# Patient Record
Sex: Male | Born: 1945 | Race: White | Hispanic: No | Marital: Married | Smoking: Former smoker
Health system: Southern US, Community
[De-identification: ages and names within clinical notes are randomized; demographics above are authoritative.]

## PROBLEM LIST (undated history)

## (undated) DIAGNOSIS — I739 Peripheral vascular disease, unspecified: Secondary | ICD-10-CM

## (undated) DIAGNOSIS — I2510 Atherosclerotic heart disease of native coronary artery without angina pectoris: Secondary | ICD-10-CM

## (undated) DIAGNOSIS — E119 Type 2 diabetes mellitus without complications: Secondary | ICD-10-CM

## (undated) DIAGNOSIS — K219 Gastro-esophageal reflux disease without esophagitis: Secondary | ICD-10-CM

## (undated) DIAGNOSIS — I10 Essential (primary) hypertension: Secondary | ICD-10-CM

## (undated) DIAGNOSIS — I255 Ischemic cardiomyopathy: Secondary | ICD-10-CM

## (undated) DIAGNOSIS — M1990 Unspecified osteoarthritis, unspecified site: Secondary | ICD-10-CM

## (undated) DIAGNOSIS — E785 Hyperlipidemia, unspecified: Secondary | ICD-10-CM

## (undated) DIAGNOSIS — I5022 Chronic systolic (congestive) heart failure: Secondary | ICD-10-CM

## (undated) HISTORY — DX: Ischemic cardiomyopathy: I25.5

## (undated) HISTORY — DX: Hyperlipidemia, unspecified: E78.5

## (undated) HISTORY — DX: Essential (primary) hypertension: I10

## (undated) HISTORY — PX: TONSILLECTOMY: SUR1361

## (undated) HISTORY — PX: PERIPHERAL ATHRECTOMY: SHX6227

---

## 1972-09-17 HISTORY — PX: INGUINAL HERNIA REPAIR: SUR1180

## 2011-07-18 DIAGNOSIS — I739 Peripheral vascular disease, unspecified: Secondary | ICD-10-CM | POA: Insufficient documentation

## 2011-07-18 DIAGNOSIS — I10 Essential (primary) hypertension: Secondary | ICD-10-CM | POA: Insufficient documentation

## 2011-07-18 DIAGNOSIS — E782 Mixed hyperlipidemia: Secondary | ICD-10-CM | POA: Insufficient documentation

## 2012-12-09 DIAGNOSIS — E785 Hyperlipidemia, unspecified: Secondary | ICD-10-CM | POA: Insufficient documentation

## 2012-12-09 DIAGNOSIS — E119 Type 2 diabetes mellitus without complications: Secondary | ICD-10-CM | POA: Insufficient documentation

## 2012-12-09 DIAGNOSIS — K219 Gastro-esophageal reflux disease without esophagitis: Secondary | ICD-10-CM | POA: Insufficient documentation

## 2013-04-03 ENCOUNTER — Other Ambulatory Visit (HOSPITAL_COMMUNITY): Payer: Self-pay | Admitting: Podiatrist

## 2013-04-03 DIAGNOSIS — I739 Peripheral vascular disease, unspecified: Secondary | ICD-10-CM

## 2013-04-06 ENCOUNTER — Ambulatory Visit (HOSPITAL_COMMUNITY)
Admission: RE | Admit: 2013-04-06 | Discharge: 2013-04-06 | Disposition: A | Discharge status: Home / Self Care | Payer: Managed Care, Other (non HMO) | Source: Ambulatory Visit | Attending: Cardiology | Admitting: Cardiology

## 2013-04-06 DIAGNOSIS — I739 Peripheral vascular disease, unspecified: Secondary | ICD-10-CM

## 2013-04-06 DIAGNOSIS — I70219 Atherosclerosis of native arteries of extremities with intermittent claudication, unspecified extremity: Secondary | ICD-10-CM

## 2013-04-06 NOTE — Progress Notes (Signed)
Lower Ext. Arterial Duplex Completed. Marilynne Halsted, RDMS, RVT

## 2013-04-17 ENCOUNTER — Encounter: Payer: Self-pay | Admitting: Cardiovascular Disease

## 2013-04-17 ENCOUNTER — Ambulatory Visit (INDEPENDENT_AMBULATORY_CARE_PROVIDER_SITE_OTHER): Payer: Managed Care, Other (non HMO) | Admitting: Cardiovascular Disease

## 2013-04-17 VITALS — BP 144/70 | HR 72 | Ht 71.0 in | Wt 200.0 lb

## 2013-04-17 DIAGNOSIS — I739 Peripheral vascular disease, unspecified: Secondary | ICD-10-CM

## 2013-04-17 DIAGNOSIS — I10 Essential (primary) hypertension: Secondary | ICD-10-CM

## 2013-04-17 DIAGNOSIS — E785 Hyperlipidemia, unspecified: Secondary | ICD-10-CM

## 2013-04-17 DIAGNOSIS — Z720 Tobacco use: Secondary | ICD-10-CM

## 2013-04-17 DIAGNOSIS — F17200 Nicotine dependence, unspecified, uncomplicated: Secondary | ICD-10-CM

## 2013-04-17 DIAGNOSIS — E119 Type 2 diabetes mellitus without complications: Secondary | ICD-10-CM

## 2013-04-17 DIAGNOSIS — R5383 Other fatigue: Secondary | ICD-10-CM

## 2013-04-17 DIAGNOSIS — D689 Coagulation defect, unspecified: Secondary | ICD-10-CM

## 2013-04-17 DIAGNOSIS — Z79899 Other long term (current) drug therapy: Secondary | ICD-10-CM

## 2013-04-17 DIAGNOSIS — Z01818 Encounter for other preprocedural examination: Secondary | ICD-10-CM

## 2013-04-17 DIAGNOSIS — R5381 Other malaise: Secondary | ICD-10-CM

## 2013-04-17 DIAGNOSIS — E1159 Type 2 diabetes mellitus with other circulatory complications: Secondary | ICD-10-CM | POA: Insufficient documentation

## 2013-04-17 HISTORY — PX: CARDIAC CATHETERIZATION: SHX172

## 2013-04-17 MED ORDER — ASPIRIN EC 81 MG PO TBEC: 81.0000 mg | DELAYED_RELEASE_TABLET | Freq: Every day | ORAL | Status: DC

## 2013-04-17 NOTE — Assessment & Plan Note (Signed)
[name] has less than 11 claudication at less than 100 feet. His predominately involves his calves. He had Dopplers performed in our office on 04/07/13 revealing a right ABI of 0.63 and a left of 0.70. He had high-frequency signals in both mid SF age as well as tibial vessel disease typical of diabetics. He presents now for angiography and potential percutaneous intervention.

## 2013-04-17 NOTE — Assessment & Plan Note (Signed)
Controlled on current medications 

## 2013-04-17 NOTE — Progress Notes (Signed)
04/17/2013 Elly Modena   68/06/1946  [id number]  Primary Physician Pcp Not In System Primary Cardiologist: [name] MD Roseanne Reno   HPI:  [name] is a 67 year old married Caucasian male father of one, grandmother, grandfather is accompanied by his wife today. He was referred by [facility] for evaluation of claudication and arterial Doppler studies which were obtained in our office 04/07/13. His cardiovascular risk factors include type 2 diabetes, hypertension, and hyperlipidemia. He has a 50-100-pack-year history of tobacco abuse currently smoking one pack to 2 packs a day. There is no family history of heart disease. He's never had a heart attack or stroke. He does complain of dyspnea on exertion. He has had claudication for the last 2 years worse over the last 3 months which is now lifestyle limiting. Doppler studies in our office performed 04/07/13 revealed a right ABI of 0.63 and a left ABI of 0.70. He had high-grade SFA disease bilaterally as well as tibial disease.   Current Outpatient Prescriptions  Medication Sig Dispense Refill  . cilostazol (PLETAL) 100 MG tablet 100 mg 2 (two) times daily.       Marland Kitchen glipiZIDE (GLUCOTROL) 10 MG tablet Take 10 mg by mouth 2 (two) times daily before a meal.       . lansoprazole (PREVACID) 30 MG capsule 30 mg daily.       Marland Kitchen lisinopril (PRINIVIL,ZESTRIL) 40 MG tablet 40 mg daily.       . metoprolol (LOPRESSOR) 100 MG tablet Take 100 mg by mouth 2 (two) times daily.       Marland Kitchen NIFEDICAL XL 60 MG 24 hr tablet Take 60 mg by mouth daily.       . pioglitazone-metformin (ACTOPLUS MET) 15-850 MG per tablet 1 tablet 2 (two) times daily with a meal.       . pravastatin (PRAVACHOL) 40 MG tablet 40 mg daily.        No current facility-administered medications for this visit.    No Known Allergies  History   Social History  . Marital Status: Married    Spouse Name: N/A    Number of Children: N/A  . Years of Education: N/A    Occupational History  . Not on file.   Social History Main Topics  . Smoking status: Current Every Day Smoker -- 1.00 packs/day    Types: Cigarettes  . Smokeless tobacco: Not on file  . Alcohol Use: No  . Drug Use: Not on file  . Sexually Active: Not on file   Other Topics Concern  . Not on file   Social History Narrative  . No narrative on file     Review of Systems: General: negative for chills, fever, night sweats or weight changes.  Cardiovascular: negative for chest pain, dyspnea on exertion, edema, orthopnea, palpitations, paroxysmal nocturnal dyspnea or shortness of breath Dermatological: negative for rash Respiratory: negative for cough or wheezing Urologic: negative for hematuria Abdominal: negative for nausea, vomiting, diarrhea, bright red blood per rectum, melena, or hematemesis Neurologic: negative for visual changes, syncope, or dizziness All other systems reviewed and are otherwise negative except as noted above.    Blood pressure 144/70, pulse 72, height 5\' 11"  (1.803 m), weight 200 lb (90.719 kg).  General appearance: alert and no distress Neck: no adenopathy, no carotid bruit, no JVD, supple, symmetrical, trachea midline and thyroid not enlarged, symmetric, no tenderness/mass/nodules Lungs: clear to auscultation bilaterally Heart: regular rate and rhythm, S1, S2 normal, no murmur, click, rub  or gallop Abdomen: soft, non-tender; bowel sounds normal; no masses,  no organomegaly Extremities: extremities normal, atraumatic, no cyanosis or edema Pulses: 2+ and symmetric diminished pedal pulses bilaterally  EKG normal sinus rhythm at 90 without ST or T wave changes. There were insignificant Q waves in the inferior leads  ASSESSMENT AND PLAN:   Claudication [name] has less than 11 claudication at less than 100 feet. His predominately involves his calves. He had Dopplers performed in our office on 04/07/13 revealing a right ABI of 0.63 and a left of 0.70.  He had high-frequency signals in both mid SF age as well as tibial vessel disease typical of diabetics. He presents now for angiography and potential percutaneous intervention.  Essential hypertension Controlled on current medications  Dyspnea on exertion This may be an anginal equivalent versus related to long-term tobacco abuse. We'll obtain a left skin my view to risk stratify him prior to his upcoming surgical procedure      [name] MD [facility], [facility] 04/17/2013 8:58 AM

## 2013-04-17 NOTE — Patient Instructions (Signed)
Dr. [name] has ordered a peripheral angiogram to be done at [facility].  This procedure is going to look at the bloodflow in your lower extremities.  If Dr. [name] is able to open up the arteries, you will have to spend one night in the hospital.  If he is not able to open the arteries, you will be able to go home that same day.    After the procedure, you will not be allowed to drive for 3 days or push, pull, or lift anything greater than 10 lbs for one week.    You will be required to have bloodwork and a chest xray prior to your procedure.  Our scheduler will advise you on when these items need to be done.       Reps: Alexander Bergeron Left groin access  Dr [name] has ordered a lexiscan myoview to be done prior to the angiogram.  Start aspirin 81mg  daily.

## 2013-04-17 NOTE — Assessment & Plan Note (Signed)
This may be an anginal equivalent versus related to long-term tobacco abuse. We'll obtain a left skin my view to risk stratify him prior to his upcoming surgical procedure

## 2013-04-20 ENCOUNTER — Encounter (HOSPITAL_COMMUNITY): Payer: Self-pay | Admitting: Pharmacy Technician

## 2013-04-21 ENCOUNTER — Ambulatory Visit (HOSPITAL_COMMUNITY)
Admission: RE | Admit: 2013-04-21 | Discharge: 2013-04-21 | Disposition: A | Discharge status: Home / Self Care | Payer: Managed Care, Other (non HMO) | Source: Ambulatory Visit | Attending: Cardiovascular Disease | Admitting: Cardiovascular Disease

## 2013-04-21 DIAGNOSIS — R5383 Other fatigue: Secondary | ICD-10-CM | POA: Insufficient documentation

## 2013-04-21 DIAGNOSIS — R0989 Other specified symptoms and signs involving the circulatory and respiratory systems: Secondary | ICD-10-CM | POA: Insufficient documentation

## 2013-04-21 DIAGNOSIS — E663 Overweight: Secondary | ICD-10-CM | POA: Insufficient documentation

## 2013-04-21 DIAGNOSIS — R5381 Other malaise: Secondary | ICD-10-CM | POA: Insufficient documentation

## 2013-04-21 DIAGNOSIS — F17200 Nicotine dependence, unspecified, uncomplicated: Secondary | ICD-10-CM | POA: Insufficient documentation

## 2013-04-21 DIAGNOSIS — Z01810 Encounter for preprocedural cardiovascular examination: Secondary | ICD-10-CM

## 2013-04-21 DIAGNOSIS — E119 Type 2 diabetes mellitus without complications: Secondary | ICD-10-CM | POA: Insufficient documentation

## 2013-04-21 DIAGNOSIS — R42 Dizziness and giddiness: Secondary | ICD-10-CM | POA: Insufficient documentation

## 2013-04-21 DIAGNOSIS — R0609 Other forms of dyspnea: Secondary | ICD-10-CM | POA: Insufficient documentation

## 2013-04-21 DIAGNOSIS — I739 Peripheral vascular disease, unspecified: Secondary | ICD-10-CM | POA: Insufficient documentation

## 2013-04-21 DIAGNOSIS — E785 Hyperlipidemia, unspecified: Secondary | ICD-10-CM

## 2013-04-21 DIAGNOSIS — I10 Essential (primary) hypertension: Secondary | ICD-10-CM

## 2013-04-21 MED ORDER — REGADENOSON 0.4 MG/5ML IV SOLN
0.4000 mg | Freq: Once | INTRAVENOUS | Status: AC
  Administered 2013-04-21: 0.4 mg via INTRAVENOUS

## 2013-04-21 MED ORDER — TECHNETIUM TC 99M SESTAMIBI GENERIC - CARDIOLITE
30.8000 | Freq: Once | INTRAVENOUS | Status: AC | PRN
  Administered 2013-04-21: 30.8 via INTRAVENOUS

## 2013-04-21 MED ORDER — TECHNETIUM TC 99M SESTAMIBI GENERIC - CARDIOLITE
10.2000 | Freq: Once | INTRAVENOUS | Status: AC | PRN
  Administered 2013-04-21: 10 via INTRAVENOUS

## 2013-04-21 NOTE — Procedures (Addendum)
[name] [address] [place] [address] [place] [address] [phone]  Cardiology Nuclear Med Study  Dennis [name] is a 67 y.o. male     MRN : [id number]     DOB: 01-Nov-1945  Procedure Date: 04/21/2013  Nuclear Med Background Indication for Stress Test:  Surgical Clearance History:  NO PRIOR HISTORY REPORTED Cardiac Risk Factors: Hypertension, Lipids, NIDDM, Overweight, PVD and Smoker  Symptoms:  DOE, Fatigue and Light-Headedness   Nuclear Pre-Procedure Caffeine/Decaff Intake:  7:00pm NPO After: 5:00am   IV Site: R Hand  IV 0.9% NS with Angio Cath:  22g  Chest Size (in):  42"  IV Started by: Emmit Pomfret, RN  Height: 5\' 11"  (1.803 m)  Cup Size: n/a  BMI:  Body mass index is 27.91 kg/(m^2). Weight:  200 lb (90.719 kg)   Tech Comments:  N/A    Nuclear Med Study 1 or 2 day study: 1 day  Stress Test Type:  Lexiscan  Order Authorizing Provider:  Nanetta Batty, MD   Resting Radionuclide: Technetium 59m Sestamibi  Resting Radionuclide Dose: 10.2 mCi   Stress Radionuclide:  Technetium 68m Sestamibi  Stress Radionuclide Dose: 30.8 mCi           Stress Protocol Rest HR:83 Stress HR:97  Rest BP: 139/93 Stress BP:160/84  Exercise Time (min): n/a METS: n/a          Dose of Adenosine (mg):  n/a Dose of Lexiscan: 0.4 mg  Dose of Atropine (mg): n/a Dose of Dobutamine: n/a mcg/kg/min (at max HR)  Stress Test Technologist: Ernestene Mention, CCT Nuclear Technologist: Gonzella Lex, CNMT   Rest Procedure:  Myocardial perfusion imaging was performed at rest 45 minutes following the intravenous administration of Technetium 89m Sestamibi. Stress Procedure:  The patient received IV Lexiscan 0.4 mg over 15-seconds.  Technetium 82m Sestamibi injected at 30-seconds.  There were no significant changes with Lexiscan.  Quantitative spect images were obtained after a 45 minute delay.  Transient Ischemic Dilatation (Normal <1.22):  1.15 Lung/Heart Ratio  (Normal <0.45):  0.34 QGS EDV:  131 ml QGS ESV:  82 ml LV Ejection Fraction: 37%  Signed by  Gonzella Lex, CNMT  PHYSICIAN INTERPRETATION  Rest ECG: NSR with non-specific ST-T wave changes  Stress ECG: No significant change from baseline ECG and No significant ST segment change suggestive of ischemia.  QPS Raw Data Images:  There is significant trace uptake in the splanchnic viscera below the diaphragm that may interfere with the ability to interpret results. Stress Images:  There is decreased uptake in the inferior wall.  There is decreased uptake in the apex.  There is decreased uptake in the lateral wall.  There is partial reversibility in this area.  These findings are consistent with ischemia. Rest Images:  There is decreased uptake in the inferior wall.  There is decreased uptake in the apex.  There is decreased uptake in the lateral wall.  Comparison with the stress images reveals moderate change. Subtraction (SDS):  There is a large sized, medium intensity defect in the inferior, inferolateral and apical myocardium that is most consistent with a previous infarction.  There is ~moderate reversibility, that is consistent with peri-infarct ischemia.  Impression Exercise Capacity:  Lexiscan with no exercise. BP Response:  Normal blood pressure response. Clinical Symptoms:  There is dyspnea. ECG Impression:  No significant ECG changes with Lexiscan. Comparison with Prior Nuclear Study: No images to compare  Overall Impression:  High risk stress nuclear study With  a large area of moderately reversible perfusion defect in what appears to be the RCA (PDA) distribution..  LV Wall Motion:  Moderately reduced global EF with inferior-inferolateral & inferoapical hypokinesis and abnormal thickening consistent with prior infarction or severe resting inschemia.   Marykay Lex, MD  04/21/2013 12:57 PM

## 2013-04-24 ENCOUNTER — Ambulatory Visit (INDEPENDENT_AMBULATORY_CARE_PROVIDER_SITE_OTHER): Payer: Managed Care, Other (non HMO) | Admitting: Cardiology

## 2013-04-24 ENCOUNTER — Encounter: Payer: Self-pay | Admitting: Cardiology

## 2013-04-24 VITALS — BP 128/72 | HR 88 | Ht 71.0 in | Wt 200.5 lb

## 2013-04-24 DIAGNOSIS — E119 Type 2 diabetes mellitus without complications: Secondary | ICD-10-CM

## 2013-04-24 DIAGNOSIS — I739 Peripheral vascular disease, unspecified: Secondary | ICD-10-CM

## 2013-04-24 DIAGNOSIS — Z01818 Encounter for other preprocedural examination: Secondary | ICD-10-CM

## 2013-04-24 DIAGNOSIS — R9439 Abnormal result of other cardiovascular function study: Secondary | ICD-10-CM

## 2013-04-24 DIAGNOSIS — F17200 Nicotine dependence, unspecified, uncomplicated: Secondary | ICD-10-CM

## 2013-04-24 DIAGNOSIS — R931 Abnormal findings on diagnostic imaging of heart and coronary circulation: Secondary | ICD-10-CM

## 2013-04-24 DIAGNOSIS — Z720 Tobacco use: Secondary | ICD-10-CM

## 2013-04-24 DIAGNOSIS — E785 Hyperlipidemia, unspecified: Secondary | ICD-10-CM

## 2013-04-24 MED ORDER — NITROGLYCERIN 0.4 MG SL SUBL: 0.4000 mg | SUBLINGUAL_TABLET | SUBLINGUAL | Status: DC | PRN

## 2013-04-24 NOTE — Patient Instructions (Signed)
NTG needed for chest tightness or pressure. No stenuous activity or long trips till cleared by Dr [name].  Corine Shelter PA-C 04/24/2013 4:38 PM

## 2013-04-24 NOTE — Progress Notes (Signed)
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 Dopplers suggest bilat SFA disease. A Myoview was done 04/21/13 and was read as "high risk" with inferior ischemia. The pt will need to have a coronary angiogram and Dr [name] wanted him seen today to discuss his test results. The pt denies any chest pain.    Current Outpatient Prescriptions  Medication Sig Dispense Refill  . aspirin EC 81 MG tablet Take 1 tablet (81 mg total) by mouth daily.  90 tablet  3  . cilostazol (PLETAL) 100 MG tablet Take 100 mg by mouth 2 (two) times daily.       Marland Kitchen glipiZIDE (GLUCOTROL XL) 10 MG 24 hr tablet Take 10 mg by mouth 2 (two) times daily.      . lansoprazole (PREVACID) 30 MG capsule Take 30 mg by mouth daily.       Marland Kitchen lisinopril (PRINIVIL,ZESTRIL) 40 MG tablet Take 40 mg by mouth daily.       . metoprolol (LOPRESSOR) 100 MG tablet Take 100 mg by mouth 2 (two) times daily.       Marland Kitchen NIFEDICAL XL 60 MG 24 hr tablet Take 60 mg by mouth daily.       . pioglitazone-metformin (ACTOPLUS MET) 15-500 MG per tablet Take 1 tablet by mouth 2 (two) times daily with a meal.      . pravastatin (PRAVACHOL) 40 MG tablet Take 40 mg by mouth daily.       . nitroGLYCERIN (NITROSTAT) 0.4 MG SL tablet Place 1 tablet (0.4 mg total) under the tongue every 5 (five) minutes as needed for chest pain.  25 tablet  2   No current facility-administered medications for this visit.    No Known Allergies  History   Social History  . Marital Status: Married    Spouse Name: N/A    Number of Children: N/A  . Years of Education: N/A   Occupational History  . Not on file.   Social History Main Topics  . Smoking status: Current Every Day Smoker -- 1.00 packs/day    Types: Cigarettes  . Smokeless tobacco: Not on file  . Alcohol Use: No  . Drug Use: Not on file  . Sexually Active: Not on file   Other  Topics Concern  . Not on file   Social History Narrative  . No narrative on file     Review of Systems: General: negative for chills, fever, night sweats or weight changes.  Cardiovascular: negative for chest pain, dyspnea on exertion, edema, orthopnea, palpitations, paroxysmal nocturnal dyspnea or shortness of breath Dermatological: negative for rash Respiratory: negative for cough or wheezing Urologic: negative for hematuria Abdominal: negative for nausea, vomiting, diarrhea, bright red blood per rectum, melena, or hematemesis Neurologic: negative for visual changes, syncope, or dizziness All other systems reviewed and are otherwise negative except as noted above.    Blood pressure 128/72, pulse 88, height 5\' 11"  (1.803 m), weight 200 lb 8 oz (90.946 kg).  General appearance: alert, cooperative and no distress Lungs: clear to auscultation bilaterally Heart: regular rate and rhythm Abdomen: soft, non-tender; bowel sounds normal; no masses,  no organomegaly Extremities: good femoral pulses with no bruits Skin: Skin color, texture, turgor normal. No rashes or lesions Neurologic: Grossly normal   ASSESSMENT AND PLAN:   Abnormal nuclear cardiac imaging test This was read as high  risk with inferior ischemia. He denies any chest pain or unusual SOB.  Claudication He is for PV angiogram in 10 days.  Type 2 diabetes mellitus .  Tobacco abuse .  Hyperlipidemia .    PLAN  I explained the need for coronary angiogram. I explained the procedure and he is agreeable. His wife was present today as well.I added NTG SL prn to his current medications. I suggested he not take any long trip or do anything strenuous till we sort this out. I encouraged him to stop smoking.   [facility] KPA-C 04/24/2013 4:29 PM

## 2013-04-24 NOTE — Assessment & Plan Note (Signed)
This was read as high risk with inferior ischemia. He denies any chest pain or unusual SOB.

## 2013-04-24 NOTE — Assessment & Plan Note (Signed)
He is for PV angiogram in 10 days.

## 2013-04-28 ENCOUNTER — Ambulatory Visit
Admission: RE | Admit: 2013-04-28 | Discharge: 2013-04-28 | Disposition: A | Discharge status: Home / Self Care | Payer: Managed Care, Other (non HMO) | Source: Ambulatory Visit | Attending: Cardiovascular Disease | Admitting: Cardiovascular Disease

## 2013-04-28 ENCOUNTER — Encounter: Payer: Self-pay | Admitting: Cardiovascular Disease

## 2013-04-28 DIAGNOSIS — Z720 Tobacco use: Secondary | ICD-10-CM

## 2013-04-29 LAB — CBC
Platelets: 240 10*3/uL (ref 150–400)
RBC: 4.65 MIL/uL (ref 4.22–5.81)
RDW: 13.5 % (ref 11.5–15.5)
WBC: 7 10*3/uL (ref 4.0–10.5)

## 2013-04-29 LAB — BASIC METABOLIC PANEL
BUN: 25 mg/dL — ABNORMAL HIGH (ref 6–23)
Calcium: 9.3 mg/dL (ref 8.4–10.5)
Glucose, Bld: 301 mg/dL — ABNORMAL HIGH (ref 70–99)

## 2013-04-29 LAB — TSH: TSH: 3.299 u[IU]/mL (ref 0.350–4.500)

## 2013-04-29 LAB — PROTIME-INR
INR: 0.91 (ref <1.50)
Prothrombin Time: 12.3 seconds (ref 11.6–15.2)

## 2013-04-29 LAB — APTT: aPTT: 31 seconds (ref 24–37)

## 2013-04-30 ENCOUNTER — Ambulatory Visit: Payer: Managed Care, Other (non HMO) | Admitting: Cardiology

## 2013-04-30 ENCOUNTER — Telehealth: Payer: Self-pay | Admitting: Cardiovascular Disease

## 2013-04-30 NOTE — Telephone Encounter (Signed)
Pt wanted to know if his shingles shot on Monday will affect his procedure on Tuesday

## 2013-05-01 NOTE — Telephone Encounter (Signed)
Returned call.  Pt informed message received.  Also informed Samara Deist, RN responded to his e-mail and asked if he received it.  Pt stated he did receive it late last night.  Verbalized understanding that he should wait until after his procedure before getting shingles vaccine.

## 2013-05-01 NOTE — Telephone Encounter (Signed)
Returned call.  Left message to call back before 4pm.  Pt sent Advice Request on 8.12.14 and was advised by K. Petra Kuba, RN to wait until after the procedure to get the vaccine.  Will inform pt when he calls back.

## 2013-05-05 ENCOUNTER — Encounter (HOSPITAL_COMMUNITY): Payer: Self-pay | Admitting: Pharmacist

## 2013-05-05 ENCOUNTER — Other Ambulatory Visit: Payer: Self-pay | Admitting: *Deleted

## 2013-05-05 ENCOUNTER — Encounter (HOSPITAL_COMMUNITY)
Admission: RE | Disposition: A | Discharge status: Home / Self Care | Payer: Self-pay | Source: Ambulatory Visit | Attending: Cardiovascular Disease

## 2013-05-05 ENCOUNTER — Ambulatory Visit (HOSPITAL_COMMUNITY)
Admission: RE | Admit: 2013-05-05 | Discharge: 2013-05-05 | Disposition: A | Discharge status: Home / Self Care | Payer: Managed Care, Other (non HMO) | Source: Ambulatory Visit | Attending: Cardiovascular Disease | Admitting: Cardiovascular Disease

## 2013-05-05 DIAGNOSIS — F17200 Nicotine dependence, unspecified, uncomplicated: Secondary | ICD-10-CM | POA: Insufficient documentation

## 2013-05-05 DIAGNOSIS — I2510 Atherosclerotic heart disease of native coronary artery without angina pectoris: Secondary | ICD-10-CM

## 2013-05-05 DIAGNOSIS — I10 Essential (primary) hypertension: Secondary | ICD-10-CM | POA: Insufficient documentation

## 2013-05-05 DIAGNOSIS — E663 Overweight: Secondary | ICD-10-CM | POA: Insufficient documentation

## 2013-05-05 DIAGNOSIS — Z7982 Long term (current) use of aspirin: Secondary | ICD-10-CM | POA: Insufficient documentation

## 2013-05-05 DIAGNOSIS — I739 Peripheral vascular disease, unspecified: Secondary | ICD-10-CM

## 2013-05-05 DIAGNOSIS — Z79899 Other long term (current) drug therapy: Secondary | ICD-10-CM | POA: Insufficient documentation

## 2013-05-05 DIAGNOSIS — Z01818 Encounter for other preprocedural examination: Secondary | ICD-10-CM

## 2013-05-05 DIAGNOSIS — E119 Type 2 diabetes mellitus without complications: Secondary | ICD-10-CM | POA: Insufficient documentation

## 2013-05-05 DIAGNOSIS — E785 Hyperlipidemia, unspecified: Secondary | ICD-10-CM | POA: Insufficient documentation

## 2013-05-05 DIAGNOSIS — I70219 Atherosclerosis of native arteries of extremities with intermittent claudication, unspecified extremity: Secondary | ICD-10-CM | POA: Insufficient documentation

## 2013-05-05 HISTORY — PX: LEFT HEART CATHETERIZATION WITH CORONARY ANGIOGRAM: SHX5451

## 2013-05-05 HISTORY — PX: LOWER EXTREMITY ANGIOGRAM: SHX5508

## 2013-05-05 LAB — GLUCOSE, CAPILLARY
Glucose-Capillary: 331 mg/dL — ABNORMAL HIGH (ref 70–99)
Glucose-Capillary: 448 mg/dL — ABNORMAL HIGH (ref 70–99)

## 2013-05-05 SURGERY — ANGIOGRAM, LOWER EXTREMITY
Anesthesia: LOCAL

## 2013-05-05 MED ORDER — ONDANSETRON HCL 4 MG/2ML IJ SOLN: 4.0000 mg | Freq: Four times a day (QID) | INTRAMUSCULAR | Status: DC | PRN

## 2013-05-05 MED ORDER — MORPHINE SULFATE 2 MG/ML IJ SOLN: 1.0000 mg | INTRAMUSCULAR | Status: DC | PRN

## 2013-05-05 MED ORDER — ASPIRIN 81 MG PO CHEW
324.0000 mg | CHEWABLE_TABLET | ORAL | Status: AC
  Administered 2013-05-05: 324 mg via ORAL

## 2013-05-05 MED ORDER — NITROGLYCERIN 0.2 MG/ML ON CALL CATH LAB
INTRAVENOUS | Status: AC
  Filled 2013-05-05: qty 1

## 2013-05-05 MED ORDER — ACETAMINOPHEN 325 MG PO TABS: 650.0000 mg | ORAL_TABLET | ORAL | Status: DC | PRN

## 2013-05-05 MED ORDER — SODIUM CHLORIDE 0.9 % IJ SOLN: 3.0000 mL | INTRAMUSCULAR | Status: DC | PRN

## 2013-05-05 MED ORDER — HEPARIN (PORCINE) IN NACL 2-0.9 UNIT/ML-% IJ SOLN
INTRAMUSCULAR | Status: AC
  Filled 2013-05-05: qty 1000

## 2013-05-05 MED ORDER — MIDAZOLAM HCL 2 MG/2ML IJ SOLN
INTRAMUSCULAR | Status: AC
  Filled 2013-05-05: qty 2

## 2013-05-05 MED ORDER — FENTANYL CITRATE 0.05 MG/ML IJ SOLN
INTRAMUSCULAR | Status: AC
  Filled 2013-05-05: qty 2

## 2013-05-05 MED ORDER — SODIUM CHLORIDE 0.9 % IV SOLN
INTRAVENOUS | Status: DC
  Administered 2013-05-05: 07:00:00 via INTRAVENOUS

## 2013-05-05 MED ORDER — ASPIRIN 81 MG PO CHEW
CHEWABLE_TABLET | ORAL | Status: AC
  Administered 2013-05-05: 81 mg
  Filled 2013-05-05: qty 1

## 2013-05-05 MED ORDER — LIDOCAINE HCL (PF) 1 % IJ SOLN
INTRAMUSCULAR | Status: AC
  Filled 2013-05-05: qty 30

## 2013-05-05 MED ORDER — ASPIRIN 81 MG PO CHEW: 81.0000 mg | CHEWABLE_TABLET | Freq: Every day | ORAL | Status: DC

## 2013-05-05 MED ORDER — INSULIN ASPART 100 UNIT/ML ~~LOC~~ SOLN
15.0000 [IU] | Freq: Once | SUBCUTANEOUS | Status: AC
  Administered 2013-05-05: 15 [IU] via SUBCUTANEOUS

## 2013-05-05 MED ORDER — DIAZEPAM 5 MG PO TABS
ORAL_TABLET | ORAL | Status: AC
  Filled 2013-05-05: qty 1

## 2013-05-05 MED ORDER — SODIUM CHLORIDE 0.9 % IV SOLN: INTRAVENOUS | Status: AC

## 2013-05-05 MED ORDER — DIAZEPAM 5 MG PO TABS
5.0000 mg | ORAL_TABLET | ORAL | Status: AC
  Administered 2013-05-05: 5 mg via ORAL

## 2013-05-05 MED ORDER — ASPIRIN 81 MG PO CHEW
CHEWABLE_TABLET | ORAL | Status: AC
  Filled 2013-05-05: qty 4

## 2013-05-05 NOTE — H&P (Signed)
    Pt was reexamined and existing H & P reviewed. No changes found.  [name], MD [facility] 05/05/2013 7:40 AM

## 2013-05-05 NOTE — CV Procedure (Signed)
Zakari Bathe is a 67 y.o. male    [id number] LOCATION:  FACILITY: [facility]  PHYSICIAN: Nanetta Batty, M.D. Oct 09, 1945   DATE OF PROCEDURE:  05/05/2013  DATE OF DISCHARGE:   CARDIAC CATHETERIZATION     History obtained from chart review.[name] is a 67 year old married Caucasian male father of one, grandmother, grandfather is accompanied by his wife today. He was referred by [facility] for evaluation of claudication and arterial Doppler studies which were obtained in our office 04/07/13. His cardiovascular risk factors include type 2 diabetes, hypertension, and hyperlipidemia. He has a 50-100-pack-year history of tobacco abuse currently smoking one pack to 2 packs a day. There is no family history of heart disease. He's never had a heart attack or stroke. He does complain of dyspnea on exertion. He has had claudication for the last 2 years worse over the last 3 months which is now lifestyle limiting. Doppler studies in our office performed 04/07/13 revealed a right ABI of 0.63 and a left ABI of 0.70. He had high-grade SFA disease bilaterally as well as tibial disease.because of a positive Myoview stress test the patient first had a diagnostic coronary arteriogram. He now presents for abdominal aortography with femoral runoff to define his anatomy potential provide a minimally invasive percutaneous option for lifestyle limiting claudication    PROCEDURE DESCRIPTION:    The patient was brought to the second floor  [facility] cath lab in the postabsorptive state. He was  premedicated with Valium 5 mg by mouth, IV Versed and fentanyl. His left groin was prepped and shaved in usual sterile fashion. Xylocaine 1% was used  for local anesthesia. A 5 French sheath was inserted into the left common femoral  artery using standard Seldinger technique. A 5 French pigtail catheter was used for midstream abdominal aortography. It was then withdrawn to the iliac bifurcation and bilateral lateral  iliac angiography was performed as well as bifemoral runoff using bolus chase digital subtraction step table technique. Visipaque dye was used for the entirety of the case (164 cc administered to the patient for both cath and repeat angiography). Retrograde aortic, left ventricular end pullback pressures were recorded.   HEMODYNAMICS:    AO SYSTOLIC/AO DIASTOLIC: 152/72    ANGIOGRAPHIC RESULTS:   1: Abdominal aortogram-renal arteries are widely patent. The infrarenal abdominal aorta and iliac bifurcation were free of significant atherosclerotic changes.  2: Left lower extremity-the iliac and common femoral arteries were widely patent. There was a 95-90% focal mid left SFA stenosis with moderate disease on either side and one vessel runoff via the peroneal artery  3: Right lower extremity-there was a 99% mid right SFA stenosis straddled by moderate disease on each side with one vessel runoff via the peroneal artery     IMPRESSION:Mr. Bell has severe SFA the knees bilaterally as well as infrapopliteal disease with one vessel runoff. 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 The sheath was removed and pressure was held on the groin to achieve hemostasis. The patient left the Cath Lab in stable condition. He'll be hydrated for 4 hours and discharged home. He will return for a coronary artery bypass grafting. After recovery from this we will then focus attention on percutaneous revascularizing his lower extremities.  [name] MD, [facility] 05/05/2013 8:45 AM

## 2013-05-05 NOTE — CV Procedure (Signed)
Dennis [name] is a 67 y.o. male    [id number] LOCATION:  FACILITY: [facility]  PHYSICIAN: Nanetta Batty, M.D. 07/20/1946   DATE OF PROCEDURE:  05/05/2013  DATE OF DISCHARGE:   CARDIAC CATHETERIZATION     History obtained from chart review.Dennis [name] is a 67 year old married Caucasian male father of one, grandmother, grandfather is accompanied by his wife today. He was referred by [facility] for evaluation of claudication and arterial Doppler studies which were obtained in our office 04/07/13. His cardiovascular risk factors include type 2 diabetes, hypertension, and hyperlipidemia. He has a 50-100-pack-year history of tobacco abuse currently smoking one pack to 2 packs a day. There is no family history of heart disease. He's never had a heart attack or stroke. He does complain of dyspnea on exertion. He has had claudication for the last 2 years worse over the last 3 months which is now lifestyle limiting. Doppler studies in our office performed 04/07/13 revealed a right ABI of 0.63 and a left ABI of 0.70. He had high-grade SFA disease bilaterally as well as tibial disease. He had a Myoview stress test that showed an ejection fraction of 37% with a scar in the RCA territory and peri-infarct ischemia. Because of this he presents now for outpatient diagnostic coronary arteriography to define his coronary anatomy prior to imaging his peripheral vessels    PROCEDURE DESCRIPTION:    The patient was brought to the second floor [facility] cath lab in the postabsorptive state. He was premedicated with Valium 5 mg by mouth, IV Versed and fentanyl. His left groinwas prepped and shaved in usual sterile fashion. Xylocaine 1% was used for local anesthesia. A 5 French sheath was inserted into the left common femoral  artery using standard Seldinger technique. 5 French right and left Judkins diagnostic catheters as well as a 5 French pigtail catheter were used for selective coronary angiography, left  ventriculography, and subselective left internal mammary artery angiography. Visipaque dye which is for the entirety of the case. Retrograde aorta, left ventricular and pullback pressures were recorded.  HEMODYNAMICS:    AO SYSTOLIC/AO DIASTOLIC: 152/72   LV SYSTOLIC/LV DIASTOLIC: 151/14  ANGIOGRAPHIC RESULTS:   1. Left main; normal  2. LAD; 95% proximally after the takeoff of the first large diagonal branch. The diagonal branch had a 99% proximal stenosis 3. Left circumflex; 80% segmental proximal first obtuse marginal branch stenosis. This is a moderate-sized vessel.  4. Right coronary artery; dominant with total occlusion in the midportion bidirectional collaterals 5.LIMA was subselectively visualized and widely patent. It was suitable for use during regard bypass grafting if necessary 6. Left ventriculography; RAO left ventriculogram was performed using  25 mL of Visipaque dye at 12 mL/second. The overall LVEF estimated  35-40 %  With wall motion abnormalities notable for moderate inferobasal hypokinesia  IMPRESSION:Dennis [name] has three-vessel disease with moderate LV dysfunction and a moderate to high risk Myoview stress test. He is asymptomatic and is diabetic. He would benefit from complete revascularization using coronary artery bypass grafting. I have discussed this with Dr. Evelene Croon from [facility]  was agreed to see him in consultation prior to discharge home. He'll return for his surgical revascularization procedure  Dennis [name]. MD, [facility] 05/05/2013 8:40 AM
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
## 2013-05-08 ENCOUNTER — Ambulatory Visit (HOSPITAL_COMMUNITY)
Admit: 2013-05-08 | Discharge: 2013-05-08 | Disposition: A | Discharge status: Home / Self Care | Payer: Managed Care, Other (non HMO) | Attending: Surgery | Admitting: Surgery

## 2013-05-08 ENCOUNTER — Encounter (HOSPITAL_COMMUNITY)
Admit: 2013-05-08 | Discharge: 2013-05-08 | Disposition: A | Discharge status: Home / Self Care | Payer: Managed Care, Other (non HMO) | Attending: Surgery | Admitting: Surgery

## 2013-05-08 ENCOUNTER — Encounter (HOSPITAL_COMMUNITY): Payer: Self-pay

## 2013-05-08 ENCOUNTER — Ambulatory Visit (HOSPITAL_COMMUNITY)
Admission: RE | Admit: 2013-05-08 | Discharge: 2013-05-08 | Disposition: A | Discharge status: Home / Self Care | Payer: Managed Care, Other (non HMO) | Source: Ambulatory Visit | Attending: Surgery | Admitting: Surgery

## 2013-05-08 VITALS — BP 112/66 | HR 55 | Temp 98.2°F | Resp 18 | Ht 69.0 in | Wt 214.0 lb

## 2013-05-08 DIAGNOSIS — I10 Essential (primary) hypertension: Secondary | ICD-10-CM | POA: Insufficient documentation

## 2013-05-08 DIAGNOSIS — E119 Type 2 diabetes mellitus without complications: Secondary | ICD-10-CM | POA: Insufficient documentation

## 2013-05-08 DIAGNOSIS — Z01818 Encounter for other preprocedural examination: Secondary | ICD-10-CM | POA: Insufficient documentation

## 2013-05-08 DIAGNOSIS — I2510 Atherosclerotic heart disease of native coronary artery without angina pectoris: Secondary | ICD-10-CM | POA: Insufficient documentation

## 2013-05-08 DIAGNOSIS — E785 Hyperlipidemia, unspecified: Secondary | ICD-10-CM | POA: Insufficient documentation

## 2013-05-08 DIAGNOSIS — F17200 Nicotine dependence, unspecified, uncomplicated: Secondary | ICD-10-CM | POA: Insufficient documentation

## 2013-05-08 DIAGNOSIS — Z01812 Encounter for preprocedural laboratory examination: Secondary | ICD-10-CM | POA: Insufficient documentation

## 2013-05-08 DIAGNOSIS — Z01810 Encounter for preprocedural cardiovascular examination: Secondary | ICD-10-CM

## 2013-05-08 DIAGNOSIS — I739 Peripheral vascular disease, unspecified: Secondary | ICD-10-CM | POA: Insufficient documentation

## 2013-05-08 HISTORY — DX: Gastro-esophageal reflux disease without esophagitis: K21.9

## 2013-05-08 HISTORY — DX: Atherosclerotic heart disease of native coronary artery without angina pectoris: I25.10

## 2013-05-08 HISTORY — DX: Type 2 diabetes mellitus without complications: E11.9

## 2013-05-08 HISTORY — DX: Unspecified osteoarthritis, unspecified site: M19.90

## 2013-05-08 LAB — COMPREHENSIVE METABOLIC PANEL
Albumin: 3.8 g/dL (ref 3.5–5.2)
Alkaline Phosphatase: 66 U/L (ref 39–117)
BUN: 22 mg/dL (ref 6–23)
Calcium: 10.2 mg/dL (ref 8.4–10.5)
Creatinine, Ser: 1.21 mg/dL (ref 0.50–1.35)
GFR calc Af Amer: 69 mL/min — ABNORMAL LOW (ref >90)
Potassium: 5.1 mEq/L (ref 3.5–5.1)
Total Protein: 7.1 g/dL (ref 6.0–8.3)

## 2013-05-08 LAB — CBC
HCT: 41.8 % (ref 39.0–52.0)
MCH: 29.4 pg (ref 26.0–34.0)
MCHC: 34.2 g/dL (ref 30.0–36.0)
RDW: 13 % (ref 11.5–15.5)

## 2013-05-08 LAB — ABO/RH: ABO/RH(D): O POS

## 2013-05-08 LAB — BLOOD GAS, ARTERIAL
Acid-base deficit: 3.3 mmol/L — ABNORMAL HIGH (ref 0.0–2.0)
Bicarbonate: 20.7 mEq/L (ref 20.0–24.0)
O2 Saturation: 97 %
TCO2: 21.8 mmol/L (ref 0–100)
pCO2 arterial: 34.2 mmHg — ABNORMAL LOW (ref 35.0–45.0)
pO2, Arterial: 83.7 mmHg (ref 80.0–100.0)

## 2013-05-08 LAB — URINALYSIS, ROUTINE W REFLEX MICROSCOPIC
Bilirubin Urine: NEGATIVE
Glucose, UA: >1000 mg/dL — AB
Hgb urine dipstick: NEGATIVE
Ketones, ur: NEGATIVE mg/dL
Protein, ur: NEGATIVE mg/dL
pH: 5 (ref 5.0–8.0)

## 2013-05-08 LAB — URINE MICROSCOPIC-ADD ON

## 2013-05-08 LAB — PROTIME-INR
INR: 0.97 (ref 0.00–1.49)
Prothrombin Time: 12.7 seconds (ref 11.6–15.2)

## 2013-05-08 LAB — HEMOGLOBIN A1C
Hgb A1c MFr Bld: 12.4 % — ABNORMAL HIGH (ref <5.7)
Mean Plasma Glucose: 309 mg/dL — ABNORMAL HIGH (ref <117)

## 2013-05-08 LAB — PULMONARY FUNCTION TEST

## 2013-05-08 LAB — APTT: aPTT: 31 seconds (ref 24–37)

## 2013-05-08 LAB — TYPE AND SCREEN: ABO/RH(D): O POS

## 2013-05-08 MED ORDER — ALBUTEROL SULFATE (5 MG/ML) 0.5% IN NEBU
2.5000 mg | INHALATION_SOLUTION | Freq: Once | RESPIRATORY_TRACT | Status: AC
  Administered 2013-05-08: 2.5 mg via RESPIRATORY_TRACT

## 2013-05-08 NOTE — Progress Notes (Signed)
Per Dr. Laneta Simmers instruct patient to stop pletal patient informed

## 2013-05-08 NOTE — Progress Notes (Signed)
Pre-op Cardiac Surgery  Carotid Findings:   Findings suggest 1-39% internal carotid artery stenosis. There are elevated external carotid artery velocities bilaterally, suggestive of stenosis. Unable to visualize the right vertebral artery, the left vertebral artery is patent with antegrade flow.   Upper Extremity Right Left  Brachial Pressures 116-Triphasic 119-Triphasic  Radial Waveforms Triphasic Triphasic  Ulnar Waveforms Triphasic Triphasic  Palmar Arch (Allen's Test) Within normal limits. Signal obliterates with radial compression, is unaffected with ulnar compression.    Lower  Extremity Right Left  Dorsalis Pedis    Anterior Tibial    Posterior Tibial    Ankle/Brachial Indices      Findings:   The patient had an ABI and lower extremity arterial duplex evaluation completed on 04/07/13. Results can be found in [facility].  05/08/2013 12:44 PM Gertie Fey, RVT, RDCS, RDMS

## 2013-05-08 NOTE — Pre-Procedure Instructions (Signed)
Dennis [name]  05/08/2013   Your procedure is scheduled on:  August 26  Report to [facility] at 05:30 AM.  Call this number if you have problems the morning of surgery: [phone]   Remember:   Do not eat food or drink liquids after midnight.   Take these medicines the morning of surgery with A SIP OF WATER: Prevacid, Metoprolol, Nifedical   Do not take Aspirin, Aleve, Naproxen, Advil, Ibuprofen, Vitamin, Herbs, or Supplements starting today  Do not wear jewelry, make-up or nail polish.  Do not wear lotions, powders, or perfumes. You may wear deodorant.  Do not shave 48 hours prior to surgery. Men may shave face and neck.  Do not bring valuables to the hospital.  [facility] is not responsible                   for any belongings or valuables.  Contacts, dentures or bridgework may not be worn into surgery.  Leave suitcase in the car. After surgery it may be brought to your room.  For patients admitted to the hospital, checkout time is 11:00 AM the day of discharge.   Special Instructions: Shower using CHG 2 nights before surgery and the night before surgery.  If you shower the day of surgery use CHG.  Use special wash - you have one bottle of CHG for all showers.  You should use approximately 1/3 of the bottle for each shower.   Please read over the following fact sheets that you were given: Pain Booklet, Coughing and Deep Breathing, Blood Transfusion Information, Open Heart Packet, MRSA Information and Surgical Site Infection Prevention

## 2013-05-08 NOTE — Progress Notes (Signed)
Pt chart left for [place], [place] (anesthesia ) to review EKG and labs (glucose 396).

## 2013-05-08 NOTE — Progress Notes (Signed)
05/08/13 1438  OBSTRUCTIVE SLEEP APNEA  Have you ever been diagnosed with sleep apnea through a sleep study? No  Do you snore loudly (loud enough to be heard through closed doors)?  0  Do you often feel tired, fatigued, or sleepy during the daytime? 1  Has anyone observed you stop breathing during your sleep? 0  Do you have, or are you being treated for high blood pressure? 1  BMI more than 35 kg/m2? 0  Age over 68 years old? 1  Neck circumference greater than 40 cm/18 inches? 0  Gender: 1  Obstructive Sleep Apnea Score 4  Score 4 or greater  Results sent to PCP

## 2013-05-11 ENCOUNTER — Other Ambulatory Visit: Payer: Self-pay | Admitting: *Deleted

## 2013-05-11 DIAGNOSIS — B958 Unspecified staphylococcus as the cause of diseases classified elsewhere: Secondary | ICD-10-CM

## 2013-05-11 MED ORDER — VANCOMYCIN HCL 10 G IV SOLR
1500.0000 mg | INTRAVENOUS | Status: AC
  Administered 2013-05-12: 1500 mg via INTRAVENOUS
  Filled 2013-05-11 (×2): qty 1500

## 2013-05-11 MED ORDER — NITROGLYCERIN IN D5W 200-5 MCG/ML-% IV SOLN
2.0000 ug/min | INTRAVENOUS | Status: AC
  Administered 2013-05-12: 5 ug/min via INTRAVENOUS
  Filled 2013-05-11 (×2): qty 250

## 2013-05-11 MED ORDER — SODIUM CHLORIDE 0.9 % IV SOLN
INTRAVENOUS | Status: AC
  Administered 2013-05-12: 1 [IU]/h via INTRAVENOUS
  Filled 2013-05-11 (×2): qty 1

## 2013-05-11 MED ORDER — DEXTROSE 5 % IV SOLN
1.5000 g | INTRAVENOUS | Status: AC
  Administered 2013-05-12: 1.5 g via INTRAVENOUS
  Administered 2013-05-12: .75 g via INTRAVENOUS
  Filled 2013-05-11 (×2): qty 1.5

## 2013-05-11 MED ORDER — POTASSIUM CHLORIDE 2 MEQ/ML IV SOLN
80.0000 meq | INTRAVENOUS | Status: DC
  Filled 2013-05-11 (×2): qty 40

## 2013-05-11 MED ORDER — PLASMA-LYTE 148 IV SOLN
INTRAVENOUS | Status: AC
  Administered 2013-05-12: 08:00:00
  Filled 2013-05-11: qty 2.5

## 2013-05-11 MED ORDER — MAGNESIUM SULFATE 50 % IJ SOLN
40.0000 meq | INTRAMUSCULAR | Status: DC
  Filled 2013-05-11 (×2): qty 10

## 2013-05-11 MED ORDER — DEXTROSE 5 % IV SOLN
750.0000 mg | INTRAVENOUS | Status: DC
  Filled 2013-05-11: qty 750

## 2013-05-11 MED ORDER — MUPIROCIN 2 % EX OINT: TOPICAL_OINTMENT | Freq: Two times a day (BID) | CUTANEOUS | Status: DC

## 2013-05-11 MED ORDER — EPINEPHRINE HCL 1 MG/ML IJ SOLN
0.5000 ug/min | INTRAVENOUS | Status: DC
  Filled 2013-05-11 (×2): qty 4

## 2013-05-11 MED ORDER — DEXMEDETOMIDINE HCL IN NACL 400 MCG/100ML IV SOLN
0.1000 ug/kg/h | INTRAVENOUS | Status: AC
  Administered 2013-05-12: 0.2 ug/kg/h via INTRAVENOUS
  Filled 2013-05-11: qty 100

## 2013-05-11 MED ORDER — DOPAMINE-DEXTROSE 3.2-5 MG/ML-% IV SOLN
2.0000 ug/kg/min | INTRAVENOUS | Status: DC
  Filled 2013-05-11: qty 250

## 2013-05-11 MED ORDER — SODIUM CHLORIDE 0.9 % IV SOLN
INTRAVENOUS | Status: AC
  Administered 2013-05-12: 70 mL/h via INTRAVENOUS
  Filled 2013-05-11 (×2): qty 40

## 2013-05-11 MED ORDER — PHENYLEPHRINE HCL 10 MG/ML IJ SOLN
30.0000 ug/min | INTRAVENOUS | Status: AC
  Administered 2013-05-12: 10 ug/min via INTRAVENOUS
  Filled 2013-05-11 (×2): qty 2

## 2013-05-11 MED ORDER — SODIUM CHLORIDE 0.9 % IV SOLN
INTRAVENOUS | Status: DC
  Filled 2013-05-11 (×2): qty 30
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
## 2013-05-12 ENCOUNTER — Encounter (HOSPITAL_COMMUNITY): Payer: Self-pay | Admitting: *Deleted

## 2013-05-12 ENCOUNTER — Inpatient Hospital Stay (HOSPITAL_COMMUNITY)
Admission: RE | Admit: 2013-05-12 | Discharge: 2013-05-16 | DRG: 236 | Disposition: A | Discharge status: Home / Self Care | Payer: Managed Care, Other (non HMO) | Source: Ambulatory Visit | Attending: Surgery | Admitting: Surgery

## 2013-05-12 ENCOUNTER — Encounter (HOSPITAL_COMMUNITY)
Admission: RE | Disposition: A | Discharge status: Home / Self Care | Payer: Self-pay | Source: Ambulatory Visit | Attending: Surgery

## 2013-05-12 ENCOUNTER — Inpatient Hospital Stay (HOSPITAL_COMMUNITY): Payer: Managed Care, Other (non HMO) | Admitting: Anesthesiology

## 2013-05-12 ENCOUNTER — Inpatient Hospital Stay (HOSPITAL_COMMUNITY): Payer: Managed Care, Other (non HMO)

## 2013-05-12 ENCOUNTER — Encounter (HOSPITAL_COMMUNITY): Payer: Self-pay | Admitting: Vascular Surgery

## 2013-05-12 DIAGNOSIS — IMO0002 Reserved for concepts with insufficient information to code with codable children: Secondary | ICD-10-CM

## 2013-05-12 DIAGNOSIS — I255 Ischemic cardiomyopathy: Secondary | ICD-10-CM

## 2013-05-12 DIAGNOSIS — I2510 Atherosclerotic heart disease of native coronary artery without angina pectoris: Secondary | ICD-10-CM

## 2013-05-12 DIAGNOSIS — E785 Hyperlipidemia, unspecified: Secondary | ICD-10-CM | POA: Diagnosis present

## 2013-05-12 DIAGNOSIS — E1151 Type 2 diabetes mellitus with diabetic peripheral angiopathy without gangrene: Secondary | ICD-10-CM

## 2013-05-12 DIAGNOSIS — F17200 Nicotine dependence, unspecified, uncomplicated: Secondary | ICD-10-CM | POA: Diagnosis present

## 2013-05-12 DIAGNOSIS — Z951 Presence of aortocoronary bypass graft: Secondary | ICD-10-CM | POA: Diagnosis present

## 2013-05-12 DIAGNOSIS — E1159 Type 2 diabetes mellitus with other circulatory complications: Secondary | ICD-10-CM | POA: Diagnosis present

## 2013-05-12 DIAGNOSIS — Z720 Tobacco use: Secondary | ICD-10-CM | POA: Diagnosis present

## 2013-05-12 DIAGNOSIS — E119 Type 2 diabetes mellitus without complications: Secondary | ICD-10-CM | POA: Diagnosis present

## 2013-05-12 DIAGNOSIS — I739 Peripheral vascular disease, unspecified: Secondary | ICD-10-CM | POA: Diagnosis present

## 2013-05-12 DIAGNOSIS — I10 Essential (primary) hypertension: Secondary | ICD-10-CM | POA: Diagnosis present

## 2013-05-12 HISTORY — PX: CORONARY ARTERY BYPASS GRAFT: SHX141

## 2013-05-12 HISTORY — PX: ENDOVEIN HARVEST OF GREATER SAPHENOUS VEIN: SHX5059

## 2013-05-12 LAB — POCT I-STAT 3, ART BLOOD GAS (G3+)
Acid-base deficit: 3 mmol/L — ABNORMAL HIGH (ref 0.0–2.0)
Acid-base deficit: 3 mmol/L — ABNORMAL HIGH (ref 0.0–2.0)
Bicarbonate: 23.4 mEq/L (ref 20.0–24.0)
Bicarbonate: 23.3 mEq/L (ref 20.0–24.0)
O2 Saturation: 100 %
O2 Saturation: 98 %
O2 Saturation: 96 %
Patient temperature: 36.2
Patient temperature: 36.3
pCO2 arterial: 51.2 mmHg — ABNORMAL HIGH (ref 35.0–45.0)
pCO2 arterial: 45 mmHg (ref 35.0–45.0)
pH, Arterial: 7.314 — ABNORMAL LOW (ref 7.350–7.450)
pH, Arterial: 7.307 — ABNORMAL LOW (ref 7.350–7.450)
pO2, Arterial: 85 mmHg (ref 80.0–100.0)
pO2, Arterial: 104 mmHg — ABNORMAL HIGH (ref 80.0–100.0)
pO2, Arterial: 85 mmHg (ref 80.0–100.0)

## 2013-05-12 LAB — POCT I-STAT GLUCOSE
Glucose, Bld: 230 mg/dL — ABNORMAL HIGH (ref 70–99)
Glucose, Bld: 150 mg/dL — ABNORMAL HIGH (ref 70–99)
Operator id: 178832
Operator id: 3406

## 2013-05-12 LAB — CBC
HCT: 37.4 % — ABNORMAL LOW (ref 39.0–52.0)
MCH: 30.1 pg (ref 26.0–34.0)
MCV: 84.4 fL (ref 78.0–100.0)
MCV: 84.6 fL (ref 78.0–100.0)
Platelets: 139 10*3/uL — ABNORMAL LOW (ref 150–400)
Platelets: 163 10*3/uL (ref 150–400)
RBC: 4.42 MIL/uL (ref 4.22–5.81)
RDW: 12.7 % (ref 11.5–15.5)
WBC: 11.9 10*3/uL — ABNORMAL HIGH (ref 4.0–10.5)
WBC: 13.5 10*3/uL — ABNORMAL HIGH (ref 4.0–10.5)

## 2013-05-12 LAB — POCT I-STAT 4, (NA,K, GLUC, HGB,HCT)
Glucose, Bld: 166 mg/dL — ABNORMAL HIGH (ref 70–99)
Glucose, Bld: 131 mg/dL — ABNORMAL HIGH (ref 70–99)
HCT: 40 % (ref 39.0–52.0)
HCT: 30 % — ABNORMAL LOW (ref 39.0–52.0)
HCT: 37 % — ABNORMAL LOW (ref 39.0–52.0)
Hemoglobin: 13.6 g/dL (ref 13.0–17.0)
Hemoglobin: 10.2 g/dL — ABNORMAL LOW (ref 13.0–17.0)
Hemoglobin: 8.8 g/dL — ABNORMAL LOW (ref 13.0–17.0)
Hemoglobin: 12.6 g/dL — ABNORMAL LOW (ref 13.0–17.0)
Potassium: 4.1 mEq/L (ref 3.5–5.1)
Sodium: 138 mEq/L (ref 135–145)
Sodium: 137 mEq/L (ref 135–145)

## 2013-05-12 LAB — GLUCOSE, CAPILLARY
Glucose-Capillary: 121 mg/dL — ABNORMAL HIGH (ref 70–99)
Glucose-Capillary: 134 mg/dL — ABNORMAL HIGH (ref 70–99)
Glucose-Capillary: 170 mg/dL — ABNORMAL HIGH (ref 70–99)
Glucose-Capillary: 451 mg/dL — ABNORMAL HIGH (ref 70–99)

## 2013-05-12 LAB — POCT I-STAT, CHEM 8
BUN: 12 mg/dL (ref 6–23)
Calcium, Ion: 1.24 mmol/L (ref 1.13–1.30)
Chloride: 106 mEq/L (ref 96–112)
Creatinine, Ser: 1 mg/dL (ref 0.50–1.35)
Glucose, Bld: 160 mg/dL — ABNORMAL HIGH (ref 70–99)

## 2013-05-12 LAB — CREATININE, SERUM
GFR calc Af Amer: >90 mL/min (ref >90)
GFR calc non Af Amer: 87 mL/min — ABNORMAL LOW (ref >90)

## 2013-05-12 LAB — PLATELET COUNT: Platelets: 152 10*3/uL (ref 150–400)

## 2013-05-12 SURGERY — CORONARY ARTERY BYPASS GRAFTING (CABG)
Anesthesia: General | Site: Leg Upper | Laterality: Right | Wound class: Clean

## 2013-05-12 MED ORDER — METOPROLOL TARTRATE 12.5 MG HALF TABLET: 12.5000 mg | ORAL_TABLET | Freq: Once | ORAL | Status: DC

## 2013-05-12 MED ORDER — SODIUM CHLORIDE 0.9 % IJ SOLN: 3.0000 mL | INTRAMUSCULAR | Status: DC | PRN

## 2013-05-12 MED ORDER — LACTATED RINGERS IV SOLN: 500.0000 mL | Freq: Once | INTRAVENOUS | Status: AC | PRN

## 2013-05-12 MED ORDER — LACTATED RINGERS IV SOLN
INTRAVENOUS | Status: DC
  Administered 2013-05-12: 13:00:00 via INTRAVENOUS

## 2013-05-12 MED ORDER — ROCURONIUM BROMIDE 100 MG/10ML IV SOLN
INTRAVENOUS | Status: DC | PRN
  Administered 2013-05-12: 20 mg via INTRAVENOUS
  Administered 2013-05-12: 100 mg via INTRAVENOUS

## 2013-05-12 MED ORDER — SODIUM CHLORIDE 0.9 % IV SOLN: 250.0000 mL | INTRAVENOUS | Status: DC

## 2013-05-12 MED ORDER — METOPROLOL TARTRATE 25 MG/10 ML ORAL SUSPENSION
12.5000 mg | Freq: Two times a day (BID) | ORAL | Status: DC
  Filled 2013-05-12 (×3): qty 5

## 2013-05-12 MED ORDER — NITROGLYCERIN IN D5W 200-5 MCG/ML-% IV SOLN: 0.0000 ug/min | INTRAVENOUS | Status: DC

## 2013-05-12 MED ORDER — BISACODYL 5 MG PO TBEC
10.0000 mg | DELAYED_RELEASE_TABLET | Freq: Every day | ORAL | Status: DC
  Administered 2013-05-14: 10 mg via ORAL
  Filled 2013-05-12 (×2): qty 2

## 2013-05-12 MED ORDER — FAMOTIDINE IN NACL 20-0.9 MG/50ML-% IV SOLN
20.0000 mg | Freq: Two times a day (BID) | INTRAVENOUS | Status: AC
  Administered 2013-05-12: 20 mg via INTRAVENOUS

## 2013-05-12 MED ORDER — DEXMEDETOMIDINE HCL IN NACL 200 MCG/50ML IV SOLN: 0.1000 ug/kg/h | INTRAVENOUS | Status: DC

## 2013-05-12 MED ORDER — ACETAMINOPHEN 160 MG/5ML PO SOLN
650.0000 mg | Freq: Once | ORAL | Status: AC
  Administered 2013-05-12: 650 mg
  Filled 2013-05-12: qty 20.3

## 2013-05-12 MED ORDER — THROMBIN 20000 UNITS EX SOLR
CUTANEOUS | Status: AC
  Filled 2013-05-12: qty 20000

## 2013-05-12 MED ORDER — ASPIRIN EC 325 MG PO TBEC
325.0000 mg | DELAYED_RELEASE_TABLET | Freq: Every day | ORAL | Status: DC
  Administered 2013-05-13 – 2013-05-16 (×4): 325 mg via ORAL
  Filled 2013-05-12 (×4): qty 1

## 2013-05-12 MED ORDER — PANTOPRAZOLE SODIUM 40 MG PO TBEC
40.0000 mg | DELAYED_RELEASE_TABLET | Freq: Every day | ORAL | Status: DC
  Administered 2013-05-14 – 2013-05-16 (×3): 40 mg via ORAL
  Filled 2013-05-12 (×3): qty 1

## 2013-05-12 MED ORDER — THROMBIN 20000 UNITS EX KIT
PACK | CUTANEOUS | Status: DC | PRN
  Administered 2013-05-12: 20000 [IU] via TOPICAL

## 2013-05-12 MED ORDER — MORPHINE SULFATE 2 MG/ML IJ SOLN
2.0000 mg | INTRAMUSCULAR | Status: DC | PRN
  Administered 2013-05-13 (×2): 4 mg via INTRAVENOUS
  Filled 2013-05-12 (×2): qty 2
  Filled 2013-05-12: qty 1

## 2013-05-12 MED ORDER — 0.9 % SODIUM CHLORIDE (POUR BTL) OPTIME
TOPICAL | Status: DC | PRN
  Administered 2013-05-12: 1000 mL

## 2013-05-12 MED ORDER — VANCOMYCIN HCL IN DEXTROSE 1-5 GM/200ML-% IV SOLN
1000.0000 mg | Freq: Once | INTRAVENOUS | Status: AC
  Administered 2013-05-12: 1000 mg via INTRAVENOUS
  Filled 2013-05-12: qty 200

## 2013-05-12 MED ORDER — DOCUSATE SODIUM 100 MG PO CAPS
200.0000 mg | ORAL_CAPSULE | Freq: Every day | ORAL | Status: DC
  Administered 2013-05-13 – 2013-05-16 (×3): 200 mg via ORAL
  Filled 2013-05-12 (×4): qty 2

## 2013-05-12 MED ORDER — METOPROLOL TARTRATE 12.5 MG HALF TABLET
12.5000 mg | ORAL_TABLET | Freq: Two times a day (BID) | ORAL | Status: DC
  Filled 2013-05-12 (×3): qty 1

## 2013-05-12 MED ORDER — FENTANYL CITRATE 0.05 MG/ML IJ SOLN
INTRAMUSCULAR | Status: DC | PRN
  Administered 2013-05-12: 50 ug via INTRAVENOUS
  Administered 2013-05-12: 450 ug via INTRAVENOUS
  Administered 2013-05-12: 250 ug via INTRAVENOUS
  Administered 2013-05-12: 300 ug via INTRAVENOUS
  Administered 2013-05-12: 100 ug via INTRAVENOUS
  Administered 2013-05-12: 50 ug via INTRAVENOUS
  Administered 2013-05-12: 250 ug via INTRAVENOUS
  Administered 2013-05-12: 50 ug via INTRAVENOUS

## 2013-05-12 MED ORDER — BISACODYL 10 MG RE SUPP: 10.0000 mg | Freq: Every day | RECTAL | Status: DC

## 2013-05-12 MED ORDER — MAGNESIUM SULFATE 40 MG/ML IJ SOLN
INTRAMUSCULAR | Status: AC
  Filled 2013-05-12: qty 100

## 2013-05-12 MED ORDER — ALBUMIN HUMAN 5 % IV SOLN: 250.0000 mL | INTRAVENOUS | Status: AC | PRN

## 2013-05-12 MED ORDER — PROTAMINE SULFATE 10 MG/ML IV SOLN
INTRAVENOUS | Status: DC | PRN
  Administered 2013-05-12: 250 mg via INTRAVENOUS

## 2013-05-12 MED ORDER — PHENYLEPHRINE HCL 10 MG/ML IJ SOLN: 0.0000 ug/min | INTRAVENOUS | Status: DC

## 2013-05-12 MED ORDER — HEPARIN SODIUM (PORCINE) 1000 UNIT/ML IJ SOLN
INTRAMUSCULAR | Status: DC | PRN
  Administered 2013-05-12: 35000 [IU] via INTRAVENOUS

## 2013-05-12 MED ORDER — HEMOSTATIC AGENTS (NO CHARGE) OPTIME
TOPICAL | Status: DC | PRN
  Administered 2013-05-12: 1 via TOPICAL

## 2013-05-12 MED ORDER — ONDANSETRON HCL 4 MG/2ML IJ SOLN
4.0000 mg | Freq: Four times a day (QID) | INTRAMUSCULAR | Status: DC | PRN
  Administered 2013-05-13 (×2): 4 mg via INTRAVENOUS
  Filled 2013-05-12 (×2): qty 2

## 2013-05-12 MED ORDER — ASPIRIN 81 MG PO CHEW
324.0000 mg | CHEWABLE_TABLET | Freq: Every day | ORAL | Status: DC
  Filled 2013-05-12: qty 4

## 2013-05-12 MED ORDER — ACETAMINOPHEN 500 MG PO TABS
1000.0000 mg | ORAL_TABLET | Freq: Four times a day (QID) | ORAL | Status: DC
  Administered 2013-05-13 – 2013-05-16 (×12): 1000 mg via ORAL
  Filled 2013-05-12 (×17): qty 2

## 2013-05-12 MED ORDER — SODIUM CHLORIDE 0.45 % IV SOLN
INTRAVENOUS | Status: DC
  Administered 2013-05-12: 13:00:00 via INTRAVENOUS

## 2013-05-12 MED ORDER — SODIUM CHLORIDE 0.9 % IV SOLN
INTRAVENOUS | Status: DC
  Administered 2013-05-13: 3 [IU]/h via INTRAVENOUS
  Filled 2013-05-12: qty 1

## 2013-05-12 MED ORDER — POTASSIUM CHLORIDE 10 MEQ/50ML IV SOLN: 10.0000 meq | INTRAVENOUS | Status: AC

## 2013-05-12 MED ORDER — VECURONIUM BROMIDE 10 MG IV SOLR
INTRAVENOUS | Status: DC | PRN
  Administered 2013-05-12: 2 mg via INTRAVENOUS
  Administered 2013-05-12: 5 mg via INTRAVENOUS
  Administered 2013-05-12: 3 mg via INTRAVENOUS

## 2013-05-12 MED ORDER — MAGNESIUM SULFATE 40 MG/ML IJ SOLN
4.0000 g | Freq: Once | INTRAMUSCULAR | Status: AC
  Administered 2013-05-12: 4 g via INTRAVENOUS

## 2013-05-12 MED ORDER — SIMVASTATIN 20 MG PO TABS
20.0000 mg | ORAL_TABLET | Freq: Every day | ORAL | Status: DC
  Administered 2013-05-13 – 2013-05-15 (×3): 20 mg via ORAL
  Filled 2013-05-12 (×5): qty 1

## 2013-05-12 MED ORDER — ACETAMINOPHEN 650 MG RE SUPP: 650.0000 mg | Freq: Once | RECTAL | Status: AC

## 2013-05-12 MED ORDER — MORPHINE SULFATE 2 MG/ML IJ SOLN
1.0000 mg | INTRAMUSCULAR | Status: AC | PRN
  Administered 2013-05-12: 2 mg via INTRAVENOUS

## 2013-05-12 MED ORDER — PROPOFOL 10 MG/ML IV BOLUS
INTRAVENOUS | Status: DC | PRN
  Administered 2013-05-12: 50 mg via INTRAVENOUS

## 2013-05-12 MED ORDER — LACTATED RINGERS IV SOLN
INTRAVENOUS | Status: DC | PRN
  Administered 2013-05-12 (×2): via INTRAVENOUS

## 2013-05-12 MED ORDER — INSULIN ASPART 100 UNIT/ML ~~LOC~~ SOLN: 10.0000 [IU] | Freq: Once | SUBCUTANEOUS | Status: DC

## 2013-05-12 MED ORDER — METOCLOPRAMIDE HCL 5 MG/ML IJ SOLN
10.0000 mg | Freq: Four times a day (QID) | INTRAMUSCULAR | Status: AC
  Administered 2013-05-12 – 2013-05-13 (×3): 10 mg via INTRAVENOUS
  Filled 2013-05-12 (×4): qty 2

## 2013-05-12 MED ORDER — MIDAZOLAM HCL 5 MG/5ML IJ SOLN
INTRAMUSCULAR | Status: DC | PRN
  Administered 2013-05-12 (×5): 2 mg via INTRAVENOUS

## 2013-05-12 MED ORDER — INSULIN REGULAR BOLUS VIA INFUSION
0.0000 [IU] | Freq: Three times a day (TID) | INTRAVENOUS | Status: DC
  Administered 2013-05-13: 3.9 [IU] via INTRAVENOUS
  Filled 2013-05-12: qty 10

## 2013-05-12 MED ORDER — THROMBIN 20000 UNITS EX SOLR
OROMUCOSAL | Status: DC | PRN
  Administered 2013-05-12: 08:00:00 via TOPICAL

## 2013-05-12 MED ORDER — DEXTROSE 5 % IV SOLN
1.5000 g | Freq: Two times a day (BID) | INTRAVENOUS | Status: AC
  Administered 2013-05-12 – 2013-05-14 (×4): 1.5 g via INTRAVENOUS
  Filled 2013-05-12 (×4): qty 1.5

## 2013-05-12 MED ORDER — CILOSTAZOL 100 MG PO TABS
100.0000 mg | ORAL_TABLET | Freq: Two times a day (BID) | ORAL | Status: DC
  Administered 2013-05-13 – 2013-05-16 (×7): 100 mg via ORAL
  Filled 2013-05-12 (×9): qty 1

## 2013-05-12 MED ORDER — ARTIFICIAL TEARS OP OINT: TOPICAL_OINTMENT | OPHTHALMIC | Status: DC | PRN

## 2013-05-12 MED ORDER — INSULIN ASPART 100 UNIT/ML ~~LOC~~ SOLN
10.0000 [IU] | Freq: Once | SUBCUTANEOUS | Status: AC
  Administered 2013-05-12: 10 [IU] via SUBCUTANEOUS

## 2013-05-12 MED ORDER — CHLORHEXIDINE GLUCONATE 4 % EX LIQD: 30.0000 mL | CUTANEOUS | Status: DC

## 2013-05-12 MED ORDER — SODIUM CHLORIDE 0.9 % IV SOLN: INTRAVENOUS | Status: DC

## 2013-05-12 MED ORDER — OXYCODONE HCL 5 MG PO TABS
5.0000 mg | ORAL_TABLET | ORAL | Status: DC | PRN
  Administered 2013-05-13: 10 mg via ORAL
  Filled 2013-05-12: qty 2

## 2013-05-12 MED ORDER — METOPROLOL TARTRATE 1 MG/ML IV SOLN: 2.5000 mg | INTRAVENOUS | Status: DC | PRN

## 2013-05-12 MED ORDER — ALBUMIN HUMAN 5 % IV SOLN
INTRAVENOUS | Status: DC | PRN
  Administered 2013-05-12: 13:00:00 via INTRAVENOUS

## 2013-05-12 MED ORDER — MIDAZOLAM HCL 2 MG/2ML IJ SOLN: 2.0000 mg | INTRAMUSCULAR | Status: DC | PRN

## 2013-05-12 MED ORDER — ACETAMINOPHEN 160 MG/5ML PO SOLN
1000.0000 mg | Freq: Four times a day (QID) | ORAL | Status: DC
  Filled 2013-05-12: qty 40

## 2013-05-12 MED ORDER — SODIUM CHLORIDE 0.9 % IJ SOLN
3.0000 mL | Freq: Two times a day (BID) | INTRAMUSCULAR | Status: DC
  Administered 2013-05-13 – 2013-05-15 (×5): 3 mL via INTRAVENOUS

## 2013-05-12 MED FILL — Lidocaine HCl IV Inj 20 MG/ML: INTRAVENOUS | Qty: 5 | Status: AC

## 2013-05-12 MED FILL — Electrolyte-R (PH 7.4) Solution: INTRAVENOUS | Qty: 4000 | Status: AC

## 2013-05-12 MED FILL — Heparin Sodium (Porcine) Inj 1000 Unit/ML: INTRAMUSCULAR | Qty: 10 | Status: AC

## 2013-05-12 MED FILL — Sodium Chloride Irrigation Soln 0.9%: Qty: 3000 | Status: AC

## 2013-05-12 MED FILL — Mannitol IV Soln 20%: INTRAVENOUS | Qty: 500 | Status: AC

## 2013-05-12 MED FILL — Sodium Bicarbonate IV Soln 8.4%: INTRAVENOUS | Qty: 50 | Status: AC

## 2013-05-12 SURGICAL SUPPLY — 102 items
ATTRACTOMAT 16X20 MAGNETIC DRP (DRAPES) ×3 IMPLANT
BAG DECANTER FOR FLEXI CONT (MISCELLANEOUS) ×3 IMPLANT
BANDAGE ELASTIC 4 VELCRO ST LF (GAUZE/BANDAGES/DRESSINGS) ×6 IMPLANT
BANDAGE ELASTIC 6 VELCRO ST LF (GAUZE/BANDAGES/DRESSINGS) ×6 IMPLANT
BANDAGE GAUZE ELAST BULKY 4 IN (GAUZE/BANDAGES/DRESSINGS) ×6 IMPLANT
BASKET HEART (ORDER IN 25'S) (MISCELLANEOUS) ×1
BASKET HEART (ORDER IN 25S) (MISCELLANEOUS) ×2 IMPLANT
BENZOIN TINCTURE PRP APPL 2/3 (GAUZE/BANDAGES/DRESSINGS) ×3 IMPLANT
BLADE STERNUM SYSTEM 6 (BLADE) ×3 IMPLANT
BLADE SURG 11 STRL SS (BLADE) ×3 IMPLANT
CANISTER SUCTION 2500CC (MISCELLANEOUS) ×3 IMPLANT
CANNULA VENOUS LOW PROF 34X46 (CANNULA) ×3 IMPLANT
CATH ROBINSON RED A/P 18FR (CATHETERS) ×6 IMPLANT
CATH THORACIC 28FR (CATHETERS) ×3 IMPLANT
CATH THORACIC 28FR RT ANG (CATHETERS) IMPLANT
CATH THORACIC 36FR (CATHETERS) ×3 IMPLANT
CATH THORACIC 36FR RT ANG (CATHETERS) ×3 IMPLANT
CLIP TI MEDIUM 24 (CLIP) IMPLANT
CLIP TI WIDE RED SMALL 24 (CLIP) ×3 IMPLANT
CLOTH BEACON ORANGE TIMEOUT ST (SAFETY) ×3 IMPLANT
CLSR STERI-STRIP ANTIMIC 1/2X4 (GAUZE/BANDAGES/DRESSINGS) ×3 IMPLANT
COVER SURGICAL LIGHT HANDLE (MISCELLANEOUS) ×3 IMPLANT
CRADLE DONUT ADULT HEAD (MISCELLANEOUS) ×3 IMPLANT
DRAPE CARDIOVASCULAR INCISE (DRAPES) ×1
DRAPE SLUSH/WARMER DISC (DRAPES) ×3 IMPLANT
DRAPE SRG 135X102X78XABS (DRAPES) ×2 IMPLANT
DRSG COVADERM 4X14 (GAUZE/BANDAGES/DRESSINGS) ×3 IMPLANT
ELECT CAUTERY BLADE 6.4 (BLADE) ×3 IMPLANT
ELECT REM PT RETURN 9FT ADLT (ELECTROSURGICAL) ×6
ELECTRODE REM PT RTRN 9FT ADLT (ELECTROSURGICAL) ×4 IMPLANT
GLOVE BIO SURGEON STRL SZ 6 (GLOVE) ×6 IMPLANT
GLOVE BIO SURGEON STRL SZ 6.5 (GLOVE) IMPLANT
GLOVE BIO SURGEON STRL SZ7 (GLOVE) IMPLANT
GLOVE BIO SURGEON STRL SZ7.5 (GLOVE) IMPLANT
GLOVE BIOGEL PI IND STRL 6 (GLOVE) ×8 IMPLANT
GLOVE BIOGEL PI IND STRL 6.5 (GLOVE) ×12 IMPLANT
GLOVE BIOGEL PI IND STRL 7.0 (GLOVE) ×8 IMPLANT
GLOVE BIOGEL PI INDICATOR 6 (GLOVE) ×4
GLOVE BIOGEL PI INDICATOR 6.5 (GLOVE) ×6
GLOVE BIOGEL PI INDICATOR 7.0 (GLOVE) ×4
GLOVE EUDERMIC 7 POWDERFREE (GLOVE) ×6 IMPLANT
GLOVE ORTHO TXT STRL SZ7.5 (GLOVE) IMPLANT
GOWN PREVENTION PLUS XLARGE (GOWN DISPOSABLE) ×3 IMPLANT
GOWN STRL NON-REIN LRG LVL3 (GOWN DISPOSABLE) ×18 IMPLANT
HEMOSTAT POWDER SURGIFOAM 1G (HEMOSTASIS) ×9 IMPLANT
HEMOSTAT SURGICEL 2X14 (HEMOSTASIS) ×3 IMPLANT
INSERT FOGARTY 61MM (MISCELLANEOUS) IMPLANT
INSERT FOGARTY XLG (MISCELLANEOUS) IMPLANT
KIT BASIN OR (CUSTOM PROCEDURE TRAY) ×3 IMPLANT
KIT CATH CPB BARTLE (MISCELLANEOUS) ×3 IMPLANT
KIT ROOM TURNOVER OR (KITS) ×3 IMPLANT
KIT SUCTION CATH 14FR (SUCTIONS) ×3 IMPLANT
KIT VASOVIEW W/TROCAR VH 2000 (KITS) ×3 IMPLANT
NS IRRIG 1000ML POUR BTL (IV SOLUTION) ×18 IMPLANT
PACK OPEN HEART (CUSTOM PROCEDURE TRAY) ×3 IMPLANT
PAD ARMBOARD 7.5X6 YLW CONV (MISCELLANEOUS) ×6 IMPLANT
PAD ELECT DEFIB RADIOL ZOLL (MISCELLANEOUS) ×3 IMPLANT
PENCIL BUTTON HOLSTER BLD 10FT (ELECTRODE) ×3 IMPLANT
PUNCH AORTIC ROTATE 4.0MM (MISCELLANEOUS) IMPLANT
PUNCH AORTIC ROTATE 4.5MM 8IN (MISCELLANEOUS) ×3 IMPLANT
PUNCH AORTIC ROTATE 5MM 8IN (MISCELLANEOUS) IMPLANT
SET CARDIOPLEGIA MPS 5001102 (MISCELLANEOUS) ×3 IMPLANT
SPONGE GAUZE 4X4 12PLY (GAUZE/BANDAGES/DRESSINGS) ×9 IMPLANT
SPONGE INTESTINAL PEANUT (DISPOSABLE) IMPLANT
SPONGE LAP 18X18 X RAY DECT (DISPOSABLE) ×3 IMPLANT
SPONGE LAP 4X18 X RAY DECT (DISPOSABLE) IMPLANT
SUT BONE WAX W31G (SUTURE) ×3 IMPLANT
SUT MNCRL AB 4-0 PS2 18 (SUTURE) IMPLANT
SUT PROLENE 3 0 SH DA (SUTURE) IMPLANT
SUT PROLENE 3 0 SH1 36 (SUTURE) ×3 IMPLANT
SUT PROLENE 4 0 RB 1 (SUTURE)
SUT PROLENE 4 0 SH DA (SUTURE) IMPLANT
SUT PROLENE 4-0 RB1 .5 CRCL 36 (SUTURE) IMPLANT
SUT PROLENE 5 0 C 1 36 (SUTURE) IMPLANT
SUT PROLENE 6 0 C 1 30 (SUTURE) ×3 IMPLANT
SUT PROLENE 7 0 BV 1 (SUTURE) IMPLANT
SUT PROLENE 7 0 BV1 MDA (SUTURE) ×6 IMPLANT
SUT PROLENE 8 0 BV175 6 (SUTURE) ×9 IMPLANT
SUT SILK  1 MH (SUTURE)
SUT SILK 1 MH (SUTURE) IMPLANT
SUT STEEL STERNAL CCS#1 18IN (SUTURE) IMPLANT
SUT STEEL SZ 6 DBL 3X14 BALL (SUTURE) ×9 IMPLANT
SUT VIC AB 1 CTX 36 (SUTURE) ×2
SUT VIC AB 1 CTX36XBRD ANBCTR (SUTURE) ×4 IMPLANT
SUT VIC AB 2-0 CT1 27 (SUTURE) ×1
SUT VIC AB 2-0 CT1 TAPERPNT 27 (SUTURE) ×2 IMPLANT
SUT VIC AB 2-0 CTX 27 (SUTURE) IMPLANT
SUT VIC AB 3-0 SH 27 (SUTURE)
SUT VIC AB 3-0 SH 27X BRD (SUTURE) IMPLANT
SUT VIC AB 3-0 X1 27 (SUTURE) IMPLANT
SUT VICRYL 4-0 PS2 18IN ABS (SUTURE) ×3 IMPLANT
SUTURE E-PAK OPEN HEART (SUTURE) ×3 IMPLANT
SYSTEM SAHARA CHEST DRAIN ATS (WOUND CARE) ×3 IMPLANT
TAPE CLOTH SURG 4X10 WHT LF (GAUZE/BANDAGES/DRESSINGS) ×3 IMPLANT
TAPE PAPER 2X10 WHT MICROPORE (GAUZE/BANDAGES/DRESSINGS) ×3 IMPLANT
TOWEL OR 17X24 6PK STRL BLUE (TOWEL DISPOSABLE) ×3 IMPLANT
TOWEL OR 17X26 10 PK STRL BLUE (TOWEL DISPOSABLE) ×3 IMPLANT
TRAY FOLEY IC TEMP SENS 14FR (CATHETERS) ×3 IMPLANT
TUBE SUCT INTRACARD DLP 20F (MISCELLANEOUS) ×3 IMPLANT
TUBING INSUFFLATION 10FT LAP (TUBING) ×3 IMPLANT
UNDERPAD 30X30 INCONTINENT (UNDERPADS AND DIAPERS) ×3 IMPLANT
WATER STERILE IRR 1000ML POUR (IV SOLUTION) ×6 IMPLANT

## 2013-05-12 NOTE — Anesthesia Procedure Notes (Addendum)
Procedures The patient was identified and consent obtained.  TO was performed, and full barrier precautions were used.  The skin was anesthetized with lidocaine.  Once the vein was located with the 22 ga. needle using ultrasound guidance , the wire was inserted into the vein.  The wire location was confirmed with ultrasound.  The insertion site was dilated and the introducer was carefully inserted and sutured in place. The PAC was checked, and floated into the PA.  Once in the PA, the catheter was secured. The patient tolerated the procedure well.  CXR was ordered for PACU. Start: 0645 End: 0710   J. Claybon Jabs, MD

## 2013-05-12 NOTE — Preoperative (Signed)
Beta Blockers   Reason not to administer Beta Blockers:Not Applicable 

## 2013-05-12 NOTE — Transfer of Care (Signed)
Immediate Anesthesia Transfer of Care Note  Patient: Dennis [name]  Procedure(s) Performed: Procedure(s): CORONARY ARTERY BYPASS GRAFTING (CABG) (N/A) ENDOVEIN HARVEST OF GREATER SAPHENOUS VEIN (Right)  Patient Location: SICU  Anesthesia Type:General  Level of Consciousness: sedated, unresponsive and Patient remains intubated per anesthesia plan  Airway & Oxygen Therapy: Patient remains intubated per anesthesia plan and Patient placed on Ventilator (see vital sign flow sheet for setting)  Post-op Assessment: Report given to PACU RN and Post -op Vital signs reviewed and stable  Post vital signs: Reviewed and stable  Complications: No apparent anesthesia complications

## 2013-05-12 NOTE — Procedures (Signed)
Extubation Procedure Note  Patient Details:   Name: Dennis [name] DOB: 1946/08/26 MRN: [id number]   Airway Documentation:   Patient weaned successfully from ventilator support. NIF 28cm. Patient now wearing nasal cannula with O2 running in at 4lpm.  Evaluation  O2 sats: stable throughout Complications: No apparent complications Patient did tolerate procedure well. Bilateral Breath Sounds: Clear;Diminished   Yes  [name] 05/12/2013, 5:58 PM

## 2013-05-12 NOTE — Interval H&P Note (Signed)
History and Physical Interval Note:  05/12/2013 7:52 AM  Dennis [name]  has presented today for surgery, with the diagnosis of cad  The various methods of treatment have been discussed with the patient and family. After consideration of risks, benefits and other options for treatment, the patient has consented to  Procedure(s): CORONARY ARTERY BYPASS GRAFTING (CABG) (N/A) as a surgical intervention .  The patient's history has been reviewed, patient examined, no change in status, stable for surgery.  I have reviewed the patient's chart and labs.  Questions were answered to the patient's satisfaction.     Alleen Borne

## 2013-05-12 NOTE — Op Note (Signed)
CARDIOVASCULAR SURGERY OPERATIVE NOTE  05/12/2013  Surgeon:  Alleen Borne, MD  First Assistant: Coral Ceo, [facility]   Preoperative Diagnosis:  Severe multi-vessel coronary artery disease   Postoperative Diagnosis:  Same   Procedure:  1. Median Sternotomy 2. Extracorporeal circulation 3.   Coronary artery bypass grafting x 4   Sequential Left internal mammary graft to the Diagonal and LAD  SVG to OM  SVG to PL   4.   Endoscopic vein harvest from the right leg   Anesthesia:  General Endotracheal   Clinical History/Surgical Indication:  The patient presented with worsening claudication symptoms in both legs with arterial dopplers showing significant lower extremity ischemia. Given his multiple cardiac risk factors he underwent a stress myoview showing a large area of moderately reversible ischemia in the RCA distribution. Cath today shows severe 3- vessel coronary artery disease with moderate LV dysfunction. CABG is the best treatment for his coronary disease followed by lower extremity revascularization once he recovers. I discussed the operative procedure with the patient and family including alternatives, benefits and risks; including but not limited to bleeding, blood transfusion, infection, stroke, myocardial infarction, graft failure, heart block requiring a permanent pacemaker, organ dysfunction, and death. Dennis [name] understands and agrees to proceed.    Preparation:  The patient was seen in the preoperative holding area and the correct patient, correct operation were confirmed with the patient after reviewing the medical record and catheterization. The consent was signed by me. Preoperative antibiotics were given. A pulmonary arterial line and radial arterial line were placed by the anesthesia team. The patient was taken back to the operating room and positioned supine on the  operating room table. After being placed under general endotracheal anesthesia by the anesthesia team a foley catheter was placed. The neck, chest, abdomen, and both legs were prepped with betadine soap and solution and draped in the usual sterile manner. A surgical time-out was taken and the correct patient and operative procedure were confirmed with the nursing and anesthesia staff.   Cardiopulmonary Bypass:  A median sternotomy was performed. The pericardium was opened in the midline. Right ventricular function appeared normal. The ascending aorta was of normal size and had no palpable plaque. There were no contraindications to aortic cannulation or cross-clamping. The patient was fully systemically heparinized and the ACT was maintained > 400 sec. The proximal aortic arch was cannulated with a 22 F aortic cannula for arterial inflow. Venous cannulation was performed via the right atrial appendage using a two-staged venous cannula. An antegrade cardioplegia/vent cannula was inserted into the mid-ascending aorta. Aortic occlusion was performed with a single cross-clamp. Systemic cooling to 32 degrees Centigrade and topical cooling of the heart with iced saline were used. Hyperkalemic antegrade cold blood cardioplegia was used to induce diastolic arrest and was then given at about 20 minute intervals throughout the period of arrest to maintain myocardial temperature at or below 10 degrees centigrade. A temperature probe was inserted into the interventricular septum and an insulating pad was placed in the pericardium.   Left internal mammary harvest:  The left side of the sternum was retracted using the Rultract retractor. The left internal mammary artery was harvested as a pedicle graft. All side branches were clipped. It was a medium-sized vessel of good quality with excellent blood flow. It was ligated distally and divided. It was sprayed with topical papaverine solution to prevent  vasospasm.   Endoscopic vein harvest:  The left greater saphenous vein was exposed through a 2  cm incision medial to the left knee. It was small and not felt to be suitable for CABG. The right greater saphenous vein was harvested endoscopically through a 2 cm incision medial to the right knee. It was harvested from the upper thigh to below the knee. It was a medium-sized vein of good quality. The side branches were all ligated with 4-0 silk ties.    Coronary arteries:  The coronary arteries were examined.   LAD:  Large vessel with minimal distal disease. The diagonal branch was also large with minimal distal disease.  LCX:  OM was a medium-sized vessel with minimal distal disease.  RCA:  The RCA was diffusely diseased with calcific plaque and this extended throughout the PDA which was not graftable since there was nowhere to open the vessel. The PL branch was a medium-sized vessel with minimal distal disease and was graftable.   Grafts:  1. Sequential LIMA to the LAD: 2 mm. It was sewn end to side using 8-0 prolene continuous suture. 2. Sequential LIMA to Diagonal:  1.75 mm. It was sewn side to side using 8-0 prolene continuous suture. 3. SVG to OM:  1.6 mm. It was sewn end to side using 7-0 prolene continuous suture. 4. SVG to PL:  1.6 mm. It was sewn end to side using 7-0 prolene continuous suture.  The proximal vein graft anastomoses were performed to the mid-ascending aorta using continuous 6-0 prolene suture. Graft markers were placed around the proximal anastomoses.   Completion:  The patient was rewarmed to 37 degrees Centigrade. The clamp was removed from the LIMA pedicle and there was rapid warming of the septum and return of ventricular fibrillation. The crossclamp was removed with a time of 87 minutes. There was spontaneous return of sinus rhythm. The distal and proximal anastomoses were checked for hemostasis. The position of the grafts was satisfactory. Two temporary  epicardial pacing wires were placed on the right atrium and two on the right ventricle. The patient was weaned from CPB without difficulty on no inotropes. CPB time was 103 minutes. Cardiac output was 5 LPM. Heparin was fully reversed with protamine and the aortic and venous cannulas removed. Hemostasis was achieved. Mediastinal and left pleural drainage tubes were placed. The sternum was closed with double #6 stainless steel wires. The fascia was closed with continuous # 1 vicryl suture. The subcutaneous tissue was closed with 2-0 vicryl continuous suture. The skin was closed with 3-0 vicryl subcuticular suture. All sponge, needle, and instrument counts were reported correct at the end of the case. Dry sterile dressings were placed over the incisions and around the chest tubes which were connected to pleurevac suction. The patient was then transported to the surgical intensive care unit in critical but stable condition.

## 2013-05-12 NOTE — Anesthesia Postprocedure Evaluation (Signed)
Anesthesia Post Note  Patient: Dennis [name]  Procedure(s) Performed: Procedure(s) (LRB): CORONARY ARTERY BYPASS GRAFTING (CABG) (N/A) ENDOVEIN HARVEST OF GREATER SAPHENOUS VEIN (Right)  Anesthesia type: General  Patient location: ICU  Post pain: Pain level controlled  Post assessment: Post-op Vital signs reviewed  Last Vitals:  Filed Vitals:   05/12/13 1345  BP: 110/71  Pulse: 80  Temp: 36.1 C  Resp: 12    Post vital signs: stable  Level of consciousness: Patient remains intubated per anesthesia plan  Complications: No apparent anesthesia complications

## 2013-05-12 NOTE — Progress Notes (Signed)
CBG 451 on arrival,Dr.Joslin notified ,orders received and implemented.

## 2013-05-12 NOTE — Anesthesia Preprocedure Evaluation (Addendum)
Anesthesia Evaluation  Patient identified by MRN, date of birth, ID band Patient awake    Reviewed: Allergy & Precautions, H&P , NPO status , Patient's Chart, lab work & pertinent test results, reviewed documented beta blocker date and time   Airway Mallampati: I      Dental  (+) Edentulous Upper and Edentulous Lower   Pulmonary shortness of breath and with exertion, former smoker,          Cardiovascular hypertension, Pt. on home beta blockers + CAD and + Peripheral Vascular Disease     Neuro/Psych    GI/Hepatic Neg liver ROS, GERD-  Medicated,  Endo/Other  diabetes, Poorly Controlled, Type 2, Insulin Dependent  Renal/GU negative Renal ROS     Musculoskeletal   Abdominal   Peds  Hematology   Anesthesia Other Findings   Reproductive/Obstetrics                         Anesthesia Physical Anesthesia Plan  ASA: III  Anesthesia Plan: General   Post-op Pain Management:    Induction: Intravenous  Airway Management Planned: Oral ETT  Additional Equipment: Arterial line, CVP, PA Cath and Ultrasound Guidance Line Placement  Intra-op Plan:   Post-operative Plan: Post-operative intubation/ventilation  Informed Consent: I have reviewed the patients History and Physical, chart, labs and discussed the procedure including the risks, benefits and alternatives for the proposed anesthesia with the patient or authorized representative who has indicated his/her understanding and acceptance.   Dental advisory given  Plan Discussed with: CRNA and Anesthesiologist  Anesthesia Plan Comments:         Anesthesia Quick Evaluation
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
## 2013-05-12 NOTE — Progress Notes (Signed)
S/p CABG x 4  Starting to wake up  Following commands  BP 102/47  Pulse 80  Temp(Src) 97.2 F (36.2 C) (Oral)  Resp 14  Ht 5\' 9"  (1.753 m)  Wt 214 lb (97.07 kg)  BMI 31.59 kg/m2  SpO2 99%   Intake/Output Summary (Last 24 hours) at 05/12/13 1746 Last data filed at 05/12/13 1700  Gross per 24 hour  Intake 3925.04 ml  Output   3870 ml  Net  55.04 ml    Being extubated now

## 2013-05-12 NOTE — Brief Op Note (Signed)
05/12/2013  11:21 AM  PATIENT:  Dennis [name]  67 y.o. male  PRE-OPERATIVE DIAGNOSIS:  CAD  POST-OPERATIVE DIAGNOSIS:  CAD  PROCEDURE:   CORONARY ARTERY BYPASS GRAFTING x 4 (Sequential LIMA-LAD-D1, SVG-OM, SVG-PL) ENDOSCOPIC VEIN HARVEST RIGHT LEG  SURGEON:  Alleen Borne, MD  ASSISTANT: Coral Ceo, PA-C  ANESTHESIA:   general  PATIENT CONDITION:  ICU - intubated and hemodynamically stable.  PRE-OPERATIVE WEIGHT: 97 kg

## 2013-05-13 ENCOUNTER — Encounter (HOSPITAL_COMMUNITY): Payer: Self-pay | Admitting: Surgery

## 2013-05-13 ENCOUNTER — Inpatient Hospital Stay (HOSPITAL_COMMUNITY): Payer: Managed Care, Other (non HMO)

## 2013-05-13 LAB — CREATININE, SERUM
Creatinine, Ser: 0.83 mg/dL (ref 0.50–1.35)
GFR calc Af Amer: >90 mL/min (ref >90)

## 2013-05-13 LAB — CBC
HCT: 36 % — ABNORMAL LOW (ref 39.0–52.0)
MCH: 30 pg (ref 26.0–34.0)
MCV: 84.9 fL (ref 78.0–100.0)
MCV: 86.4 fL (ref 78.0–100.0)
Platelets: 144 10*3/uL — ABNORMAL LOW (ref 150–400)
Platelets: 145 10*3/uL — ABNORMAL LOW (ref 150–400)
RDW: 13.1 % (ref 11.5–15.5)
RDW: 13.5 % (ref 11.5–15.5)
WBC: 12 10*3/uL — ABNORMAL HIGH (ref 4.0–10.5)
WBC: 13.1 10*3/uL — ABNORMAL HIGH (ref 4.0–10.5)

## 2013-05-13 LAB — MAGNESIUM: Magnesium: 1.9 mg/dL (ref 1.5–2.5)

## 2013-05-13 LAB — GLUCOSE, CAPILLARY
Glucose-Capillary: 142 mg/dL — ABNORMAL HIGH (ref 70–99)
Glucose-Capillary: 111 mg/dL — ABNORMAL HIGH (ref 70–99)
Glucose-Capillary: 142 mg/dL — ABNORMAL HIGH (ref 70–99)
Glucose-Capillary: 125 mg/dL — ABNORMAL HIGH (ref 70–99)
Glucose-Capillary: 122 mg/dL — ABNORMAL HIGH (ref 70–99)
Glucose-Capillary: 174 mg/dL — ABNORMAL HIGH (ref 70–99)

## 2013-05-13 LAB — POCT I-STAT, CHEM 8
Hemoglobin: 12.6 g/dL — ABNORMAL LOW (ref 13.0–17.0)
Sodium: 136 mEq/L (ref 135–145)
TCO2: 22 mmol/L (ref 0–100)

## 2013-05-13 LAB — BASIC METABOLIC PANEL
BUN: 11 mg/dL (ref 6–23)
CO2: 24 mEq/L (ref 19–32)
Calcium: 8.4 mg/dL (ref 8.4–10.5)
Chloride: 104 mEq/L (ref 96–112)
Creatinine, Ser: 0.83 mg/dL (ref 0.50–1.35)
GFR calc Af Amer: >90 mL/min (ref >90)

## 2013-05-13 MED ORDER — METOPROLOL TARTRATE 25 MG PO TABS
25.0000 mg | ORAL_TABLET | Freq: Two times a day (BID) | ORAL | Status: DC
  Administered 2013-05-13 – 2013-05-14 (×4): 25 mg via ORAL
  Filled 2013-05-13 (×6): qty 1

## 2013-05-13 MED ORDER — LISINOPRIL 20 MG PO TABS
20.0000 mg | ORAL_TABLET | Freq: Every day | ORAL | Status: DC
  Administered 2013-05-13 – 2013-05-16 (×4): 20 mg via ORAL
  Filled 2013-05-13 (×4): qty 1

## 2013-05-13 MED ORDER — INSULIN DETEMIR 100 UNIT/ML ~~LOC~~ SOLN
15.0000 [IU] | Freq: Every day | SUBCUTANEOUS | Status: DC
  Administered 2013-05-13: 15 [IU] via SUBCUTANEOUS
  Filled 2013-05-13 (×2): qty 0.15

## 2013-05-13 MED ORDER — ENOXAPARIN SODIUM 40 MG/0.4ML ~~LOC~~ SOLN
40.0000 mg | SUBCUTANEOUS | Status: DC
  Administered 2013-05-13 – 2013-05-15 (×3): 40 mg via SUBCUTANEOUS
  Filled 2013-05-13 (×4): qty 0.4

## 2013-05-13 MED ORDER — LIVING WELL WITH DIABETES BOOK
Freq: Once | Status: AC
  Administered 2013-05-13: 1
  Filled 2013-05-13: qty 1

## 2013-05-13 MED ORDER — METOCLOPRAMIDE HCL 5 MG/ML IJ SOLN
10.0000 mg | Freq: Four times a day (QID) | INTRAMUSCULAR | Status: AC
  Administered 2013-05-13 – 2013-05-14 (×4): 10 mg via INTRAVENOUS
  Filled 2013-05-13 (×4): qty 2

## 2013-05-13 MED ORDER — INSULIN DETEMIR 100 UNIT/ML ~~LOC~~ SOLN: 15.0000 [IU] | Freq: Every day | SUBCUTANEOUS | Status: DC

## 2013-05-13 MED ORDER — METOPROLOL TARTRATE 25 MG/10 ML ORAL SUSPENSION
12.5000 mg | Freq: Two times a day (BID) | ORAL | Status: DC
  Filled 2013-05-13 (×6): qty 5

## 2013-05-13 MED ORDER — GLIPIZIDE ER 10 MG PO TB24
10.0000 mg | ORAL_TABLET | Freq: Two times a day (BID) | ORAL | Status: DC
  Administered 2013-05-13 – 2013-05-16 (×6): 10 mg via ORAL
  Filled 2013-05-13 (×8): qty 1

## 2013-05-13 MED ORDER — INSULIN ASPART 100 UNIT/ML ~~LOC~~ SOLN
0.0000 [IU] | SUBCUTANEOUS | Status: DC
  Administered 2013-05-13: 4 [IU] via SUBCUTANEOUS
  Administered 2013-05-13: 2 [IU] via SUBCUTANEOUS
  Administered 2013-05-14: 12 [IU] via SUBCUTANEOUS
  Administered 2013-05-14: 2 [IU] via SUBCUTANEOUS
  Administered 2013-05-14: 12 [IU] via SUBCUTANEOUS
  Administered 2013-05-14: 8 [IU] via SUBCUTANEOUS
  Administered 2013-05-14: 2 [IU] via SUBCUTANEOUS
  Administered 2013-05-14: 4 [IU] via SUBCUTANEOUS
  Administered 2013-05-15: 12 [IU] via SUBCUTANEOUS
  Administered 2013-05-15 (×2): 4 [IU] via SUBCUTANEOUS
  Administered 2013-05-15: 5 [IU] via SUBCUTANEOUS
  Administered 2013-05-15: 4 [IU] via SUBCUTANEOUS
  Administered 2013-05-15 – 2013-05-16 (×2): 8 [IU] via SUBCUTANEOUS
  Administered 2013-05-16: 15 [IU] via SUBCUTANEOUS

## 2013-05-13 MED FILL — Potassium Chloride Inj 2 mEq/ML: INTRAVENOUS | Qty: 40 | Status: AC

## 2013-05-13 MED FILL — Sodium Chloride IV Soln 0.9%: INTRAVENOUS | Qty: 1000 | Status: AC

## 2013-05-13 MED FILL — Magnesium Sulfate Inj 50%: INTRAMUSCULAR | Qty: 10 | Status: AC

## 2013-05-13 MED FILL — Heparin Sodium (Porcine) Inj 1000 Unit/ML: INTRAMUSCULAR | Qty: 30 | Status: AC

## 2013-05-13 NOTE — Progress Notes (Addendum)
      [address]       [name] [address]             [phone]      1 Day Post-Op Procedure(s) (LRB): CORONARY ARTERY BYPASS GRAFTING (CABG) (N/A) ENDOVEIN HARVEST OF GREATER SAPHENOUS VEIN (Right)  Subjective:  States his chest feels tight this morning.  Pain medication helps.  Objective: Vital signs in last 24 hours: Temp:  [96.8 F (36 C)-98.1 F (36.7 C)] 97.9 F (36.6 C) (08/27 0715) Pulse Rate:  [76-89] 76 (08/27 0715) Cardiac Rhythm:  [-] Normal sinus rhythm (08/26 2000) Resp:  [0-21] 15 (08/27 0715) BP: (80-141)/(41-77) 141/77 mmHg (08/27 0545) SpO2:  [93 %-100 %] 98 % (08/27 0715) Arterial Line BP: (83-169)/(43-67) 129/52 mmHg (08/27 0715) FiO2 (%):  [40 %-50 %] 40 % (08/26 1658) Weight:  [209 lb 6.4 oz (94.983 kg)] 209 lb 6.4 oz (94.983 kg) (08/27 0500)  Hemodynamic parameters for last 24 hours: PAP: (13-30)/(4-15) 20/9 mmHg CO:  [3.3 L/min-5.1 L/min] 4.7 L/min CI:  [3 L/min/m2-4.7 L/min/m2] 4.3 L/min/m2  Intake/Output from previous day: 08/26 0701 - 08/27 0700 In: 4860.9 [I.V.:3565.9; OZHYQ:657; NG/GT:30; IV Piggyback:400] Out: 4945 [Urine:2950; Blood:1625; Chest Tube:370]  General appearance: alert, cooperative and no distress Heart: regular rate and rhythm Lungs: clear to auscultation bilaterally Abdomen: soft, non-tender; bowel sounds normal; no masses,  no organomegaly Extremities: edema trace Wound: clean and dry  Lab Results:  Recent Labs  05/12/13 1900 05/13/13 0350  WBC 13.5* 12.0*  HGB 13.4 12.7*  HCT 37.4* 36.0*  PLT 163 144*   BMET:  Recent Labs  05/12/13 1848 05/12/13 1900 05/13/13 0350  NA 139  --  135  K 4.2  --  3.9  CL 106  --  104  CO2  --   --  24  GLUCOSE 160*  --  116*  BUN 12  --  11  CREATININE 1.00 0.86 0.83  CALCIUM  --   --  8.4    PT/INR:  Recent Labs  05/12/13 1300  LABPROT 15.4*  INR 1.25   ABG    Component Value Date/Time   PHART 7.319* 05/12/2013 1844   HCO3 23.3  05/12/2013 1844   TCO2 22 05/12/2013 1848   ACIDBASEDEF 3.0* 05/12/2013 1844   O2SAT 96.0 05/12/2013 1844   CBG (last 3)   Recent Labs  05/13/13 0244 05/13/13 0350 05/13/13 0601  GLUCAP 112* 111* 111*   CXR: mild atelectasis  ECG: Sinus, no acute changes. Assessment/Plan: S/P Procedure(s) (LRB): CORONARY ARTERY BYPASS GRAFTING (CABG) (N/A) ENDOVEIN HARVEST OF GREATER SAPHENOUS VEIN (Right)  1. CV- NSR, some PACs- will wean NTG as tolerated 2. Pulm- wean oxygen as tolerated, chest tube output low, encouraged use of IS 3. Renal- creatinine, lytes okay, weight stable 4. Expected Acute Blood Loss Anemia- mild, Hgb 12.6 5. DM- uncontrolled, remains on insulin drip, preop A1c is 12.4- will start Levemir, will likely need insulin at home  6. Dispo- stable, follow progression orders   LOS: 1 day    [name] 05/13/2013   Chart reviewed, patient examined, agree with above. Will resume oral diabetes meds. 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

## 2013-05-13 NOTE — Progress Notes (Signed)
Inpatient Diabetes Program Recommendations  AACE/ADA: New Consensus Statement on Inpatient Glycemic Control (2013)  Target Ranges:  Prepandial:   less than 140 mg/dL      Peak postprandial:   less than 180 mg/dL (1-2 hours)      Critically ill patients:  140 - 180 mg/dL   Reason for Visit: Note patient post-op day 1 CABG.  A1C=12.4%.  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 May consider GLP-1 inhibitor such as Victoza or Byetta at discharge since these medications are "Glucose Dependent" and do not cause hypoglycemia.  Both of these drugs are injectable and patient is open to trying them at discharge.  He see's a MD in [place] who is going to be retiring soon.  May benefit from seeing endocrinologist after discharge?    States that he and his wife are serious about taking care of "their" diabetes.  Will place outpatient diabetes education order after discharge for follow-up with CDE.

## 2013-05-13 NOTE — Progress Notes (Signed)
Patient ID: Dennis [name], male   DOB: 07-18-1946, 67 y.o.   MRN: [id number]  SICU Evening Rounds:  Hemodynamically stable.  N/V today. Will give some Reglan. He probably has some diabetic gastroparesis.  Urine output ok  Glucose 170 this pm.

## 2013-05-14 ENCOUNTER — Inpatient Hospital Stay (HOSPITAL_COMMUNITY): Payer: Managed Care, Other (non HMO)

## 2013-05-14 DIAGNOSIS — I2510 Atherosclerotic heart disease of native coronary artery without angina pectoris: Secondary | ICD-10-CM | POA: Insufficient documentation

## 2013-05-14 DIAGNOSIS — Z951 Presence of aortocoronary bypass graft: Secondary | ICD-10-CM | POA: Diagnosis present

## 2013-05-14 DIAGNOSIS — I739 Peripheral vascular disease, unspecified: Secondary | ICD-10-CM

## 2013-05-14 LAB — CBC
MCH: 30 pg (ref 26.0–34.0)
MCHC: 34.5 g/dL (ref 30.0–36.0)
Platelets: 141 10*3/uL — ABNORMAL LOW (ref 150–400)
RDW: 13.5 % (ref 11.5–15.5)

## 2013-05-14 LAB — BASIC METABOLIC PANEL
BUN: 10 mg/dL (ref 6–23)
Calcium: 8.8 mg/dL (ref 8.4–10.5)
Creatinine, Ser: 0.89 mg/dL (ref 0.50–1.35)
GFR calc non Af Amer: 86 mL/min — ABNORMAL LOW (ref >90)
Glucose, Bld: 157 mg/dL — ABNORMAL HIGH (ref 70–99)
Sodium: 136 mEq/L (ref 135–145)

## 2013-05-14 LAB — GLUCOSE, CAPILLARY
Glucose-Capillary: 216 mg/dL — ABNORMAL HIGH (ref 70–99)
Glucose-Capillary: 259 mg/dL — ABNORMAL HIGH (ref 70–99)

## 2013-05-14 MED ORDER — SODIUM CHLORIDE 0.9 % IJ SOLN
3.0000 mL | Freq: Two times a day (BID) | INTRAMUSCULAR | Status: DC
  Administered 2013-05-14 – 2013-05-15 (×3): 3 mL via INTRAVENOUS

## 2013-05-14 MED ORDER — SODIUM CHLORIDE 0.9 % IV SOLN: 250.0000 mL | INTRAVENOUS | Status: DC | PRN

## 2013-05-14 MED ORDER — METFORMIN HCL 500 MG PO TABS
500.0000 mg | ORAL_TABLET | Freq: Two times a day (BID) | ORAL | Status: DC
  Administered 2013-05-14 – 2013-05-15 (×3): 500 mg via ORAL
  Filled 2013-05-14 (×5): qty 1

## 2013-05-14 MED ORDER — MOVING RIGHT ALONG BOOK
Freq: Once | Status: AC
  Administered 2013-05-14: 10:00:00
  Filled 2013-05-14: qty 1

## 2013-05-14 MED ORDER — PIOGLITAZONE HCL 15 MG PO TABS
15.0000 mg | ORAL_TABLET | Freq: Two times a day (BID) | ORAL | Status: DC
  Administered 2013-05-14 – 2013-05-16 (×5): 15 mg via ORAL
  Filled 2013-05-14 (×7): qty 1

## 2013-05-14 MED ORDER — SODIUM CHLORIDE 0.9 % IJ SOLN: 3.0000 mL | INTRAMUSCULAR | Status: DC | PRN

## 2013-05-14 NOTE — Progress Notes (Signed)
Transferred to 2W via wheelchair. On O2 at 2 l/[place]. Alert and oriented with minimal assistance on transfer. Report given to receiving RN prior to leaving 2S. Transferred to floor bed and wife is at the bedside. Receiving unit notified of patient's arrival. NA at bedside. No untoward event happened during transport.

## 2013-05-14 NOTE — Progress Notes (Signed)
Pt. Seen and examined. Agree with the NP/PA-C note as written.  Coming along well. Will need peripheral intervention at some point with Dr. [name].  Chrystie Nose, MD, [facility] Attending Cardiologist [facility]

## 2013-05-14 NOTE — Progress Notes (Signed)
CARDIAC REHAB PHASE I   PRE:  Rate/Rhythm: 96SR  BP:  Supine:   Sitting: 105/67  Standing:    SaO2: 94%2L  MODE:  Ambulation: 550 ft   POST:  Rate/Rhythm: 117 ST  BP:  Supine: 144/73  Sitting:   Standing:    SaO2: 96%2L 1405-1438 Pt walked 550 ft on 2L with rolling walker and asst x 2 with steady gait. Can be asst x 1 next walk. Tolerated well. To bed after walk. Pt very motivated to walk and go farther. Will walk off oxygen tomorrow.    Luetta Nutting, RN BSN  05/14/2013 2:33 PM

## 2013-05-14 NOTE — Progress Notes (Signed)
[facility]  Subjective: No complaints. Denies CP/SOB. He walked 1 lap around surgical unit today with little difficulty.   Objective: Vital signs in last 24 hours: Temp:  [97.4 F (36.3 C)-98.6 F (37 C)] 97.4 F (36.3 C) (08/28 0728) Pulse Rate:  [65-95] 95 (08/28 0900) Resp:  [11-22] 19 (08/28 0900) BP: (98-129)/(53-68) 123/64 mmHg (08/28 0900) SpO2:  [90 %-99 %] 96 % (08/28 0900) Arterial Line BP: (115-178)/(33-70) 151/47 mmHg (08/27 1800) Weight:  [208 lb 6.4 oz (94.53 kg)] 208 lb 6.4 oz (94.53 kg) (08/28 0500)    Intake/Output from previous day: 08/27 0701 - 08/28 0700 In: 1323 [P.O.:700; I.V.:523; IV Piggyback:100] Out: 965 [Urine:915; Chest Tube:50] Intake/Output this shift: Total I/O In: 120 [P.O.:50; I.V.:20; IV Piggyback:50] Out: 30 [Urine:30]  Medications Current Facility-Administered Medications  Medication Dose Route Frequency Provider Last Rate Last Dose  . 0.45 % sodium chloride infusion   Intravenous Continuous Wilmon Pali, PA-C 20 mL/hr at 05/12/13 1300    . 0.9 %  sodium chloride infusion   Intravenous Continuous Wilmon Pali, PA-C 20 mL/hr at 05/12/13 1330    . 0.9 %  sodium chloride infusion  250 mL Intravenous Continuous Gina L Collins, PA-C      . 0.9 %  sodium chloride infusion  250 mL Intravenous PRN Erin Barrett, PA-C      . acetaminophen (TYLENOL) tablet 1,000 mg  1,000 mg Oral Q6H Wilmon Pali, PA-C   1,000 mg at 05/14/13 1610   Or  . acetaminophen (TYLENOL) solution 1,000 mg  1,000 mg Per Tube Q6H Gina L Collins, PA-C      . aspirin EC tablet 325 mg  325 mg Oral Daily Wilmon Pali, PA-C   325 mg at 05/14/13 9604   Or  . aspirin chewable tablet 324 mg  324 mg Per Tube Daily Gina L Collins, PA-C      . bisacodyl (DULCOLAX) EC tablet 10 mg  10 mg Oral Daily Wilmon Pali, PA-C   10 mg at 05/14/13 0932   Or  . bisacodyl (DULCOLAX) suppository 10 mg  10 mg Rectal Daily Wilmon Pali, PA-C      . cilostazol  (PLETAL) tablet 100 mg  100 mg Oral BID Wilmon Pali, PA-C   100 mg at 05/14/13 0934  . docusate sodium (COLACE) capsule 200 mg  200 mg Oral Daily Wilmon Pali, PA-C   200 mg at 05/14/13 0932  . enoxaparin (LOVENOX) injection 40 mg  40 mg Subcutaneous Q24H Alleen Borne, MD   40 mg at 05/13/13 1400  . glipiZIDE (GLUCOTROL XL) 24 hr tablet 10 mg  10 mg Oral BID AC Alleen Borne, MD   10 mg at 05/14/13 0825  . insulin aspart (novoLOG) injection 0-24 Units  0-24 Units Subcutaneous Q4H Erin Barrett, PA-C   4 Units at 05/14/13 0827  . lactated ringers infusion   Intravenous Continuous Wilmon Pali, PA-C 20 mL/hr at 05/12/13 1300    . lisinopril (PRINIVIL,ZESTRIL) tablet 20 mg  20 mg Oral Daily Alleen Borne, MD   20 mg at 05/14/13 0934  . metFORMIN (GLUCOPHAGE) tablet 500 mg  500 mg Oral BID WC Erin Barrett, PA-C   500 mg at 05/14/13 0933  . metoCLOPramide (REGLAN) injection 10 mg  10 mg Intravenous Q6H Alleen Borne, MD   10 mg at 05/14/13 0653  . metoprolol (LOPRESSOR) injection 2.5-5 mg  2.5-5 mg Intravenous Q2H  PRN Wilmon Pali, PA-C      . metoprolol tartrate (LOPRESSOR) tablet 25 mg  25 mg Oral BID Alleen Borne, MD   25 mg at 05/14/13 0933   Or  . metoprolol tartrate (LOPRESSOR) 25 mg/10 mL oral suspension 12.5 mg  12.5 mg Per Tube BID Alleen Borne, MD      . morphine 2 MG/ML injection 2-5 mg  2-5 mg Intravenous Q1H PRN Wilmon Pali, PA-C   4 mg at 05/13/13 1611  . ondansetron (ZOFRAN) injection 4 mg  4 mg Intravenous Q6H PRN Wilmon Pali, PA-C   4 mg at 05/13/13 1941  . oxyCODONE (Oxy IR/ROXICODONE) immediate release tablet 5-10 mg  5-10 mg Oral Q3H PRN Wilmon Pali, PA-C   10 mg at 05/13/13 1610  . pantoprazole (PROTONIX) EC tablet 40 mg  40 mg Oral Daily Wilmon Pali, PA-C   40 mg at 05/14/13 0933  . pioglitazone (ACTOS) tablet 15 mg  15 mg Oral BID PC Erin Barrett, PA-C   15 mg at 05/14/13 0945  . simvastatin (ZOCOR) tablet 20 mg  20 mg Oral q1800 Wilmon Pali, PA-C    20 mg at 05/13/13 1744  . sodium chloride 0.9 % injection 3 mL  3 mL Intravenous Q12H Wilmon Pali, PA-C   3 mL at 05/14/13 0935  . sodium chloride 0.9 % injection 3 mL  3 mL Intravenous PRN Gina L Collins, PA-C      . sodium chloride 0.9 % injection 3 mL  3 mL Intravenous Q12H Erin Barrett, PA-C   3 mL at 05/14/13 0935  . sodium chloride 0.9 % injection 3 mL  3 mL Intravenous PRN Erin Barrett, PA-C        PE: General appearance: alert, cooperative and no distress Lungs: clear to auscultation bilaterally Heart: regular rate and rhythm Extremities: no LEE Pulses: 2+ and symmetric Skin: warm and dry Neurologic: Grossly normal  Lab Results:   Recent Labs  05/13/13 0350 05/13/13 1700 05/13/13 1756 05/14/13 0428  WBC 12.0* 13.1*  --  12.1*  HGB 12.7* 12.9* 12.6* 11.7*  HCT 36.0* 36.9* 37.0* 33.9*  PLT 144* 145*  --  141*   BMET  Recent Labs  05/13/13 0350 05/13/13 1700 05/13/13 1756 05/14/13 0428  NA 135  --  136 136  K 3.9  --  4.3 4.1  CL 104  --  103 102  CO2 24  --   --  26  GLUCOSE 116*  --  197* 157*  BUN 11  --  7 10  CREATININE 0.83 0.83 0.80 0.89  CALCIUM 8.4  --   --  8.8   PT/INR  Recent Labs  05/12/13 1300  LABPROT 15.4*  INR 1.25    Assessment/Plan  Principal Problem:   CAD- s/p CABGn 05/12/13 Active Problems:   Essential hypertension   Hyperlipidemia   Type 2 diabetes mellitus  Plan: Day 2 s/p CABG x 4, by Dr. Laneta Simmers.  Sequential left internal mammary graft to the Diagonal and LAD. SVG to OM and SVG to PL. Normal progression. No post-operative arrhthymias noted on telemetry. + for occasional PVCs. HR in the mid 90s. BP has been borderline. Will continue with 25 mg of Lopressor for now. Plan is transfer to telemetry later today. Will continue to follow.      LOS: 2 days    Mcgregor Tinnon M. Sharol Harness, PA-C 05/14/2013 10:10 AM

## 2013-05-14 NOTE — Progress Notes (Addendum)
[address]       [name] [address]             [phone]      2 Days Post-Op Procedure(s) (LRB): CORONARY ARTERY BYPASS GRAFTING (CABG) (N/A) ENDOVEIN HARVEST OF GREATER SAPHENOUS VEIN (Right)  Subjective:  [name] complains that his right leg at [facility] site is a little sore this morning.  He is not using his IS and has been encouraged to do and the importance of use was stressed to the patient.  He is amublating +BM  Objective: Vital signs in last 24 hours: Temp:  [97.4 F (36.3 C)-98.6 F (37 C)] 97.4 F (36.3 C) (08/28 0728) Pulse Rate:  [65-90] 89 (08/28 0700) Cardiac Rhythm:  [-] Normal sinus rhythm (08/28 0400) Resp:  [11-22] 19 (08/28 0700) BP: (98-129)/(53-68) 98/53 mmHg (08/28 0700) SpO2:  [90 %-99 %] 95 % (08/28 0700) Arterial Line BP: (115-178)/(33-71) 151/47 mmHg (08/27 1800) Weight:  [208 lb 6.4 oz (94.53 kg)] 208 lb 6.4 oz (94.53 kg) (08/28 0500)  Hemodynamic parameters for last 24 hours: PAP: (21-33)/(9-21) 33/21 mmHg CO:  [6 L/min] 6 L/min CI:  [5.5 L/min/m2] 5.5 L/min/m2  Intake/Output from previous day: 08/27 0701 - 08/28 0700 In: 1323 [P.O.:700; I.V.:523; IV Piggyback:100] Out: 965 [Urine:915; Chest Tube:50]  General appearance: alert, cooperative and no distress Heart: regular rate and rhythm Lungs: clear to auscultation bilaterally Abdomen: soft, non-tender; bowel sounds normal; no masses,  no organomegaly Extremities: edema none appreciated Wound: clean and dry  Lab Results:  Recent Labs  05/13/13 1700 05/13/13 1756 05/14/13 0428  WBC 13.1*  --  12.1*  HGB 12.9* 12.6* 11.7*  HCT 36.9* 37.0* 33.9*  PLT 145*  --  141*   BMET:  Recent Labs  05/13/13 0350  05/13/13 1756 05/14/13 0428  NA 135  --  136 136  K 3.9  --  4.3 4.1  CL 104  --  103 102  CO2 24  --   --  26  GLUCOSE 116*  --  197* 157*  BUN 11  --  7 10  CREATININE 0.83  < > 0.80 0.89  CALCIUM 8.4  --   --  8.8  < > = values in this interval  not displayed.  PT/INR:  Recent Labs  05/12/13 1300  LABPROT 15.4*  INR 1.25   ABG    Component Value Date/Time   PHART 7.319* 05/12/2013 1844   HCO3 23.3 05/12/2013 1844   TCO2 22 05/13/2013 1756   ACIDBASEDEF 3.0* 05/12/2013 1844   O2SAT 96.0 05/12/2013 1844   CBG (last 3)   Recent Labs  05/13/13 1953 05/14/13 0005 05/14/13 0353  GLUCAP 174* 148* 135*    Assessment/Plan: S/P Procedure(s) (LRB): CORONARY ARTERY BYPASS GRAFTING (CABG) (N/A) ENDOVEIN HARVEST OF GREATER SAPHENOUS VEIN (Right)  1. CV- NSR, off all drips rate and pressure controlled- continue Lopressor and Lisionpril 2. Pulm- + atelectasis on CXR, wean oxygen as tolerated, encouraged use of IS 3. Renal- creatinine, lytes okay- weight is 6 lbs below admission, will hold off on Lasix for now  4. DM- CBGs controlled- continue glucotrol, on Levemir- may be able to stop insulin and restart home Actos and Metformin 5. Dispo- patient stable, will transfer to 2W  LOS: 2 days    [name] 05/14/2013   Chart reviewed, patient examined, agree with above. He is doing well. Will switch to oral diabetes agents and follow CBG's. He will need  outpatient diabetes education.

## 2013-05-15 DIAGNOSIS — I2510 Atherosclerotic heart disease of native coronary artery without angina pectoris: Secondary | ICD-10-CM

## 2013-05-15 DIAGNOSIS — I259 Chronic ischemic heart disease, unspecified: Secondary | ICD-10-CM | POA: Insufficient documentation

## 2013-05-15 DIAGNOSIS — E1159 Type 2 diabetes mellitus with other circulatory complications: Secondary | ICD-10-CM

## 2013-05-15 DIAGNOSIS — I739 Peripheral vascular disease, unspecified: Secondary | ICD-10-CM | POA: Diagnosis present

## 2013-05-15 DIAGNOSIS — F17200 Nicotine dependence, unspecified, uncomplicated: Secondary | ICD-10-CM

## 2013-05-15 DIAGNOSIS — I2589 Other forms of chronic ischemic heart disease: Secondary | ICD-10-CM

## 2013-05-15 LAB — CBC
HCT: 30.4 % — ABNORMAL LOW (ref 39.0–52.0)
MCV: 87.4 fL (ref 78.0–100.0)
RBC: 3.48 MIL/uL — ABNORMAL LOW (ref 4.22–5.81)
WBC: 9.3 10*3/uL (ref 4.0–10.5)

## 2013-05-15 LAB — GLUCOSE, CAPILLARY
Glucose-Capillary: 196 mg/dL — ABNORMAL HIGH (ref 70–99)
Glucose-Capillary: 253 mg/dL — ABNORMAL HIGH (ref 70–99)
Glucose-Capillary: 178 mg/dL — ABNORMAL HIGH (ref 70–99)

## 2013-05-15 LAB — BASIC METABOLIC PANEL
BUN: 14 mg/dL (ref 6–23)
CO2: 25 mEq/L (ref 19–32)
Chloride: 102 mEq/L (ref 96–112)
Creatinine, Ser: 1.08 mg/dL (ref 0.50–1.35)
Potassium: 3.5 mEq/L (ref 3.5–5.1)

## 2013-05-15 MED ORDER — ZOLPIDEM TARTRATE 5 MG PO TABS
5.0000 mg | ORAL_TABLET | Freq: Every evening | ORAL | Status: DC | PRN
  Administered 2013-05-15: 5 mg via ORAL
  Filled 2013-05-15: qty 1

## 2013-05-15 MED ORDER — ZOLPIDEM TARTRATE 5 MG PO TABS: 5.0000 mg | ORAL_TABLET | Freq: Every evening | ORAL | Status: DC | PRN

## 2013-05-15 MED ORDER — METOPROLOL TARTRATE 25 MG PO TABS: 37.5000 mg | ORAL_TABLET | Freq: Two times a day (BID) | ORAL | Status: DC

## 2013-05-15 MED ORDER — METOPROLOL TARTRATE 25 MG PO TABS
25.0000 mg | ORAL_TABLET | Freq: Two times a day (BID) | ORAL | Status: DC
  Administered 2013-05-15 – 2013-05-16 (×3): 25 mg via ORAL
  Filled 2013-05-15 (×4): qty 1

## 2013-05-15 MED ORDER — ZOLPIDEM TARTRATE 5 MG PO TABS: 10.0000 mg | ORAL_TABLET | Freq: Every evening | ORAL | Status: DC | PRN

## 2013-05-15 MED ORDER — METFORMIN HCL 500 MG PO TABS
1000.0000 mg | ORAL_TABLET | Freq: Two times a day (BID) | ORAL | Status: DC
  Administered 2013-05-15 – 2013-05-16 (×2): 1000 mg via ORAL
  Filled 2013-05-15 (×4): qty 2

## 2013-05-15 NOTE — Progress Notes (Addendum)
[address]       [place] [address]             [phone]      3 Days Post-Op  Procedure(s) (LRB): CORONARY ARTERY BYPASS GRAFTING (CABG) (N/A) ENDOVEIN HARVEST OF GREATER SAPHENOUS VEIN (Right) Subjective: Looks and feels well  Objective  Telemetry sinus rhythm/tachy  Temp:  [98.1 F (36.7 C)-99.5 F (37.5 C)] 98.1 F (36.7 C) (08/29 0426) Pulse Rate:  [92-118] 106 (08/29 0426) Resp:  [16-20] 19 (08/29 0426) BP: (111-137)/(61-73) 121/62 mmHg (08/29 0426) SpO2:  [94 %-96 %] 95 % (08/29 0426) Weight:  [208 lb (94.348 kg)] 208 lb (94.348 kg) (08/29 0432)   Intake/Output Summary (Last 24 hours) at 05/15/13 0747 Last data filed at 05/15/13 0620  Gross per 24 hour  Intake    720 ml  Output    780 ml  Net    -60 ml       General appearance: alert, cooperative and no distress Heart: regular rate and rhythm Lungs: clear to auscultation bilaterally Abdomen: benign Extremities: mild edema right leg Wound: incis healing well  Lab Results:  Recent Labs  05/13/13 0350 05/13/13 1700  05/14/13 0428 05/15/13 0450  NA 135  --   < > 136 136  K 3.9  --   < > 4.1 3.5  CL 104  --   < > 102 102  CO2 24  --   --  26 25  GLUCOSE 116*  --   < > 157* 194*  BUN 11  --   < > 10 14  CREATININE 0.83 0.83  < > 0.89 1.08  CALCIUM 8.4  --   --  8.8 9.0  MG 2.2 1.9  --   --   --   < > = values in this interval not displayed. No results found for this basename: AST, ALT, ALKPHOS, BILITOT, PROT, ALBUMIN,  in the last 72 hours No results found for this basename: LIPASE, AMYLASE,  in the last 72 hours  Recent Labs  05/14/13 0428 05/15/13 0450  WBC 12.1* 9.3  HGB 11.7* 10.4*  HCT 33.9* 30.4*  MCV 86.9 87.4  PLT 141* 144*   No results found for this basename: CKTOTAL, CKMB, TROPONINI,  in the last 72 hours No components found with this basename: POCBNP,  No results found for this basename: DDIMER,  in the last 72 hours No results found for this  basename: HGBA1C,  in the last 72 hours No results found for this basename: CHOL, HDL, LDLCALC, TRIG, CHOLHDL,  in the last 72 hours No results found for this basename: TSH, T4TOTAL, FREET3, T3FREE, THYROIDAB,  in the last 72 hours No results found for this basename: VITAMINB12, FOLATE, FERRITIN, TIBC, IRON, RETICCTPCT,  in the last 72 hours  Medications: Scheduled . acetaminophen  1,000 mg Oral Q6H   Or  . acetaminophen (TYLENOL) oral liquid 160 mg/5 mL  1,000 mg Per Tube Q6H  . aspirin EC  325 mg Oral Daily   Or  . aspirin  324 mg Per Tube Daily  . bisacodyl  10 mg Oral Daily   Or  . bisacodyl  10 mg Rectal Daily  . cilostazol  100 mg Oral BID  . docusate sodium  200 mg Oral Daily  . enoxaparin (LOVENOX) injection  40 mg Subcutaneous Q24H  . glipiZIDE  10 mg Oral BID AC  . insulin aspart  0-24 Units Subcutaneous Q4H  .  lisinopril  20 mg Oral Daily  . metFORMIN  500 mg Oral BID WC  . metoprolol tartrate  25 mg Oral BID   Or  . metoprolol tartrate  12.5 mg Per Tube BID  . pantoprazole  40 mg Oral Daily  . pioglitazone  15 mg Oral BID PC  . simvastatin  20 mg Oral q1800  . sodium chloride  3 mL Intravenous Q12H  . sodium chloride  3 mL Intravenous Q12H     Radiology/Studies:  Dg Chest Port 1 View  05/14/2013   CLINICAL DATA:  CABG.  EXAM: PORTABLE CHEST - 1 VIEW  COMPARISON:  05/13/2013  FINDINGS: Prior CABG. Interval removal of left chest tubes and Swan-Ganz catheter. No visible pneumothorax. Minimal bibasilar atelectasis, improved since prior study. Stable mild cardiomegaly.  IMPRESSION: Interval removal of left chest tube without pneumothorax. Improving bibasilar atelectasis.   Electronically Signed   By: Charlett Nose   On: 05/14/2013 08:15    INR: Will add last result for INR, ABG once components are confirmed Will add last 4 CBG results once components are confirmed  Assessment/Plan: S/P Procedure(s) (LRB): CORONARY ARTERY BYPASS GRAFTING (CABG) (N/A) ENDOVEIN HARVEST  OF GREATER SAPHENOUS VEIN (Right)  1 doing very well overall 2 sugars a little high, HbAic 12.4 on admit, will increase metformin 3 d/c epw's 4 poss home in am    LOS: 3 days    GOLD,WAYNE E 8/29/20147:47 AM   Chart reviewed, patient examined, agree with above. Will get outpt DM education consult.

## 2013-05-15 NOTE — Progress Notes (Signed)
CARDIAC REHAB PHASE I   PRE:  Rate/Rhythm: 98SR  BP:  Supine:   Sitting: 129/61  Standing:    SaO2: 95%RA  MODE:  Ambulation: 700 ft   POST:  Rate/Rhythm: 114 ST  BP:  Supine:   Sitting: 152/66  Standing:    SaO2: 95%RA 1312-113 Pt walked 700 ft on RA with rolling walker and asst x 1 with steady gait. Tolerated well. Wife states pt getting rolling walker for home use. Education completed with pt and wife. Gave smoking cessation handouts and encouraged pt to call 1800quitnow if needed. Pt mentioned using e-cig. Told pt I could not recommend since not FDA approved and that if you use nicotine cartridges you have to be careful of amount because may be getting more than with smoking. Pt stated he might want to take chantix. Told pt to discuss with MD.  Discussed with pt that he needs better control of DM as his HBGA1C is over 12. Discussed carb counting and gave handout. Gave ex ed encouraging pt to do what he can due to claudication. Encouraged them to watch post op video.   Luetta Nutting, RN BSN  05/15/2013 2:08 PM

## 2013-05-15 NOTE — Progress Notes (Signed)
Evening meds given. Patient ambulated 774ft independently with standby assisted; tolerated very well. Transferred to 2W19. Patient OOB to bedside chair. Report/care released to [place], Charity fundraiser. Call bell and family near.Dennis [name]

## 2013-05-15 NOTE — Progress Notes (Signed)
Ambulated 550 ft on room air using walker; O2 sat 96%. Returned to room w/out incidence. Remains on room air. Will continue to monitor.Dennis [name]

## 2013-05-15 NOTE — Progress Notes (Signed)
Assisted patient to bed. EPW removed with ends intact. No ectopy noted. Small amount of blood to right insertion site. Manual pressure applied x2 min; 2x2 applied. Patient tolerated well denies any distress. Instructed to notify RN of any s/s of distress. VSS. Instructed to lay supine in bed x1 hr; verbalized understanding. Call bell, phone, and urinal in reach. VSS. Will monitor.Dennis [name]
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
## 2013-05-15 NOTE — Progress Notes (Signed)
Subjective:  Up in halls with PT.  Objective:  Vital Signs in the last 24 hours: Temp:  [98.1 F (36.7 C)-99.5 F (37.5 C)] 98.1 F (36.7 C) (08/29 0426) Pulse Rate:  [95-118] 106 (08/29 0426) Resp:  [18-20] 19 (08/29 0426) BP: (121-137)/(61-73) 121/62 mmHg (08/29 0426) SpO2:  [94 %-96 %] 95 % (08/29 0426) Weight:  [208 lb (94.348 kg)] 208 lb (94.348 kg) (08/29 0432)  Intake/Output from previous day:  Intake/Output Summary (Last 24 hours) at 05/15/13 0857 Last data filed at 05/15/13 0620  Gross per 24 hour  Intake    600 ml  Output    750 ml  Net   -150 ml    Physical Exam: General appearance: alert, cooperative and no distress Lungs: clear to auscultation bilaterally Heart: regular rate and rhythm   Rate: 104  Rhythm: sinus tachycardia  Lab Results:  Recent Labs  05/14/13 0428 05/15/13 0450  WBC 12.1* 9.3  HGB 11.7* 10.4*  PLT 141* 144*    Recent Labs  05/14/13 0428 05/15/13 0450  NA 136 136  K 4.1 3.5  CL 102 102  CO2 26 25  GLUCOSE 157* 194*  BUN 10 14  CREATININE 0.89 1.08   No results found for this basename: TROPONINI, CK, MB,  in the last 72 hours  Recent Labs  05/12/13 1300  INR 1.25    Imaging: Imaging results have been reviewed  Cardiac Studies:  Assessment/Plan:   Principal Problem:   Abnormal nuclear cardiac imaging test Active Problems:   CAD- s/p CABG X 4 05/12/13   Uncontrolled diabetes mellitus type 2 with peripheral artery disease- HGB A1c 12 on admission   Ischemic cardiomyopathy- EF 37% by Myoview   Claudication   Essential hypertension   Hyperlipidemia   Tobacco abuse   PVD - bilat SFA disease    PLAN: 67 y/o [name] [facility] work up. Myoview abnormal, subsequent cath followed by CABG this admission. Possible discharge in am. Follow up with Dr [name] as an OP. At some point he will need SFA intervention. His primary care MD is at [facility] PA-C Beeper [phone] 05/15/2013,  8:57 AM   I have seen and examined the patient along with Corine Shelter PA-C.  I have reviewed the chart, notes and new data.  I agree with PA's note.  Key new complaints: smiling, feels great, only sore when he turns over Key examination changes: clear lungs, normal rhythm, no rub; wounds healing nicely Key new findings / data: note severely elevated A1c - will need to work hard on DM management He has committed to permanent smoking cessation  PLAN: Probably DC home tomorrow. Encourage to curtail sodium and carbohydrates in diet. Will need to return for bilateral SFA PTA/stent, probably after several weeks of rehab. Would probably recommend long term Plavix, to start at discharge.  Thurmon Fair, MD, [facility] [phone] 05/15/2013, 9:13 AM

## 2013-05-15 NOTE — Care Management Note (Addendum)
    Page 1 of 1   05/15/2013     4:24:05 PM   CARE MANAGEMENT NOTE 05/15/2013  Patient:  Dennis [name], Dennis [name]   Account Number:  [id number]  Date Initiated:  05/15/2013  Documentation initiated by:  Elmar Antigua  Subjective/Objective Assessment:   PT S/P CABG X 4 ON 05/12/13.  PTA, PT INDEPENDENT, LIVES WITH SPOUSE.     Action/Plan:   WIFE TO PROVIDE 24HR CARE AT DC.  REQUESTS RW FOR HOME. REFERRAL FAXED TO [facility] AT [phone].   Anticipated DC Date:  05/16/2013   Anticipated DC Plan:  HOME/SELF CARE      DC Planning Services  CM consult      Choice offered to / List presented to:     DME arranged  [facility]      DME agency  [facility]        Status of service:  Completed, signed off Medicare Important Message given?   (If response is "NO", the following Medicare IM given date fields will be blank) Date Medicare IM given:   Date Additional Medicare IM given:    Discharge Disposition:  HOME/SELF CARE  Per UR Regulation:  Reviewed for med. necessity/level of care/duration of stay  If discussed at Long Length of Stay Meetings, dates discussed:    Comments:  05/15/13 Etosha Wetherell,RN,BSN [phone] PER [facility] REPRESENTATIVE, RW TO BE DELIVERED TODAY TO PATIENT'S HOSPITAL ROOM.  [facility]  CONTACT #  [phone].

## 2013-05-16 LAB — GLUCOSE, CAPILLARY: Glucose-Capillary: 204 mg/dL — ABNORMAL HIGH (ref 70–99)

## 2013-05-16 MED ORDER — PIOGLITAZONE HCL 15 MG PO TABS: 15.0000 mg | ORAL_TABLET | Freq: Two times a day (BID) | ORAL | Status: DC

## 2013-05-16 MED ORDER — OXYCODONE HCL 5 MG PO TABS: 5.0000 mg | ORAL_TABLET | ORAL | Status: DC | PRN

## 2013-05-16 MED ORDER — METOPROLOL TARTRATE 25 MG PO TABS: 25.0000 mg | ORAL_TABLET | Freq: Two times a day (BID) | ORAL | Status: DC

## 2013-05-16 MED ORDER — METFORMIN HCL 1000 MG PO TABS: 1000.0000 mg | ORAL_TABLET | Freq: Two times a day (BID) | ORAL | Status: DC

## 2013-05-16 MED ORDER — CLOPIDOGREL BISULFATE 75 MG PO TABS: 75.0000 mg | ORAL_TABLET | Freq: Every day | ORAL | Status: DC

## 2013-05-16 NOTE — Progress Notes (Signed)
Pt discharged per MD order and protocol. Discharge instructions reviewed with patient and wife, all questions answered. Pt aware of all follow up appointments and given all prescriptions.

## 2013-05-16 NOTE — Progress Notes (Addendum)
       [address]       [place] [address]             [phone]          4 Days Post-Op Procedure(s) (LRB): CORONARY ARTERY BYPASS GRAFTING (CABG) (N/A) ENDOVEIN HARVEST OF GREATER SAPHENOUS VEIN (Right)  Subjective: Feels well, wants to go home.   Objective: Vital signs in last 24 hours: Patient Vitals for the past 24 hrs:  BP Temp Temp src Pulse Resp SpO2 Weight  05/16/13 0403 152/81 mmHg 98.3 F (36.8 C) Oral 112 18 93 % 207 lb 3.7 oz (94 kg)  05/15/13 2020 117/69 mmHg 98.2 F (36.8 C) Oral 99 18 94 % -  05/15/13 1412 144/77 mmHg 98.2 F (36.8 C) Oral 101 18 98 % -  05/15/13 1033 126/58 mmHg - - 110 - - -  05/15/13 0930 116/57 mmHg - - 106 18 92 % -  05/15/13 0919 120/50 mmHg - - 106 18 97 % -   Current Weight  05/16/13 207 lb 3.7 oz (94 kg)     Intake/Output from previous day: 08/29 0701 - 08/30 0700 In: 1080 [P.O.:1080] Out: 776 [Urine:775; Stool:1]  CBGs [id number]   PHYSICAL EXAM:  Heart: RRR Lungs: Clear Wound: Clean and dry Extremities: No LE edema    Lab Results: CBC: Recent Labs  05/14/13 0428 05/15/13 0450  WBC 12.1* 9.3  HGB 11.7* 10.4*  HCT 33.9* 30.4*  PLT 141* 144*   BMET:  Recent Labs  05/14/13 0428 05/15/13 0450  NA 136 136  K 4.1 3.5  CL 102 102  CO2 26 25  GLUCOSE 157* 194*  BUN 10 14  CREATININE 0.89 1.08  CALCIUM 8.8 9.0    PT/INR: No results found for this basename: LABPROT, INR,  in the last 72 hours    Assessment/Plan: S/P Procedure(s) (LRB): CORONARY ARTERY BYPASS GRAFTING (CABG) (N/A) ENDOVEIN HARVEST OF GREATER SAPHENOUS VEIN (Right) CV- stable, BPs better controlled.  Maintaining SR. Will increase ACE-I to home dose, start Plavix as per cardiology. DM- sugars improving.  Will get OP DM followup. Plan discharge home today- instructions reviewed with patient.   LOS: 4 days    Tempestt Silba H 05/16/2013

## 2013-05-16 NOTE — Progress Notes (Signed)
CT sutures removed per MD order and protocol. Sterri strips applied. Pt tolerated procedure well.

## 2013-05-20 ENCOUNTER — Telehealth: Payer: Self-pay

## 2013-05-20 DIAGNOSIS — R112 Nausea with vomiting, unspecified: Secondary | ICD-10-CM

## 2013-05-20 MED ORDER — PROMETHAZINE HCL 25 MG PO TABS: 25.0000 mg | ORAL_TABLET | Freq: Four times a day (QID) | ORAL | Status: DC | PRN

## 2013-05-20 NOTE — Telephone Encounter (Signed)
Pt called c/o N/V this am. He denies any fevers, diarrhea, ABD distention. Incision sites look good. He states that he had this problem in the hospital too. The only new medication he is taking is Plavix. He is scheduled to see Dr [name] Thursday will discuss med's with him. I will call in Phenergan 25 mg prn to pharm. If no improvement or sx's worsen patient will call back.

## 2013-05-28 ENCOUNTER — Ambulatory Visit (INDEPENDENT_AMBULATORY_CARE_PROVIDER_SITE_OTHER): Payer: Managed Care, Other (non HMO) | Admitting: Cardiovascular Disease

## 2013-05-28 ENCOUNTER — Encounter: Payer: Self-pay | Admitting: Cardiovascular Disease

## 2013-05-28 VITALS — BP 168/90 | HR 75 | Ht 69.5 in | Wt 201.9 lb

## 2013-05-28 DIAGNOSIS — I739 Peripheral vascular disease, unspecified: Secondary | ICD-10-CM

## 2013-05-28 DIAGNOSIS — I2510 Atherosclerotic heart disease of native coronary artery without angina pectoris: Secondary | ICD-10-CM

## 2013-05-28 DIAGNOSIS — I10 Essential (primary) hypertension: Secondary | ICD-10-CM

## 2013-05-28 MED ORDER — METOPROLOL TARTRATE 50 MG PO TABS: 50.0000 mg | ORAL_TABLET | Freq: Two times a day (BID) | ORAL | Status: DC

## 2013-05-28 NOTE — Assessment & Plan Note (Signed)
The patient had an abnormal Myoview stress test showing an ejection fraction of 37% with scar in the RCA territory and peri-infarct ischemia. As a result of this come up prior to his provider ram he underwent cardiac catheterization revealing three-vessel disease with moderate LV dysfunction. He ultimately underwent coronary bypass grafting by Dr. Melrose Nakayama on 05/12/13 with a sequential LIMA to the LAD and first diagonal branch, vein to OM1 and being PLA. He did well postoperatively. He denies chest pain or shortness of breath. He is going to participate in chronic rehabilitation. He is attempting to stop smoking.

## 2013-05-28 NOTE — Progress Notes (Signed)
05/28/2013 Elly Modena   September 18, 1945  [id number]  Primary Physician Stanford Scotland, MD Primary Cardiologist: [name] MD Roseanne Reno   HPI:  [name] is a 67 year old married Caucasian male father of one, grandmother, grandfather is accompanied by his wife today. He was referred by [facility] at [facility] for evaluation of claudication and arterial Doppler studies which were obtained in our office 04/07/13. His cardiovascular risk factors include type 2 diabetes, hypertension, and hyperlipidemia. He has a 50-100-pack-year history of tobacco abuse currently smoking one pack to 2 packs a day. There is no family history of heart disease. He's never had a heart attack or stroke. He does complain of dyspnea on exertion. He has had claudication for the last 2 years worse over the last 3 months which is now lifestyle limiting. Doppler studies in our office performed 04/07/13 revealed a right ABI of 0.63 and a left ABI of 0.70. He had high-grade SFA disease bilaterally as well as tibial disease.because of a positive Myoview stress test the patient first had a diagnostic coronary arteriogram. Which showed 3 vessel disease with moderate LV dysfunction. He'll underwent coronary bypass grafting x4 by Dr. Melrose Nakayama on 05/12/13 with a sequential LIMA to the LAD and diagonal branch, vein to obtuse marginal branch and to the PDA. His postop course was uncomplicated. He still has lifestyle limiting claudication right greater than left and wishes to have his right lower extremity revascularized. Angiography did reveal high-grade bilateral SFA disease as well as tibial vessel disease.   Current Outpatient Prescriptions  Medication Sig Dispense Refill  . aspirin EC 81 MG tablet Take 1 tablet (81 mg total) by mouth daily.  90 tablet  3  . cilostazol (PLETAL) 100 MG tablet Take 100 mg by mouth 2 (two) times daily.       . clopidogrel (PLAVIX) 75 MG tablet Take 1 tablet (75 mg total) by mouth daily.   30 tablet  1  . glipiZIDE (GLUCOTROL XL) 10 MG 24 hr tablet Take 10 mg by mouth 2 (two) times daily.      . lansoprazole (PREVACID) 30 MG capsule Take 30 mg by mouth daily.       Marland Kitchen lisinopril (PRINIVIL,ZESTRIL) 40 MG tablet Take 40 mg by mouth daily.       . metFORMIN (GLUCOPHAGE) 1000 MG tablet Take 1 tablet (1,000 mg total) by mouth 2 (two) times daily with a meal.  60 tablet  1  . metoprolol tartrate (LOPRESSOR) 50 MG tablet Take 1 tablet (50 mg total) by mouth 2 (two) times daily.  180 tablet  3  . pioglitazone (ACTOS) 15 MG tablet Take 1 tablet (15 mg total) by mouth 2 (two) times daily after a meal.  60 tablet  1  . pravastatin (PRAVACHOL) 40 MG tablet Take 40 mg by mouth daily.       . promethazine (PHENERGAN) 25 MG tablet Take 1 tablet (25 mg total) by mouth every 6 (six) hours as needed for nausea.  30 tablet  0  . oxyCODONE (OXY IR/ROXICODONE) 5 MG immediate release tablet Take 1-2 tablets (5-10 mg total) by mouth every 3 (three) hours as needed for pain.  30 tablet  0   No current facility-administered medications for this visit.    Allergies  Allergen Reactions  . Chlorhexidine Rash    History   Social History  . Marital Status: Married    Spouse Name: N/A    Number of Children: N/A  .  Years of Education: N/A   Occupational History  . Not on file.   Social History Main Topics  . Smoking status: Former Smoker -- 1.00 packs/day for 55 years    Types: Cigarettes    Quit date: 05/05/2013  . Smokeless tobacco: Not on file     Comment: now using E- Cig.  . Alcohol Use: No  . Drug Use: No  . Sexual Activity: Not on file   Other Topics Concern  . Not on file   Social History Narrative  . No narrative on file     Review of Systems: General: negative for chills, fever, night sweats or weight changes.  Cardiovascular: negative for chest pain, dyspnea on exertion, edema, orthopnea, palpitations, paroxysmal nocturnal dyspnea or shortness of breath Dermatological:  negative for rash Respiratory: negative for cough or wheezing Urologic: negative for hematuria Abdominal: negative for nausea, vomiting, diarrhea, bright red blood per rectum, melena, or hematemesis Neurologic: negative for visual changes, syncope, or dizziness All other systems reviewed and are otherwise negative except as noted above.    Blood pressure 168/90, pulse 75, height 5' 9.5" (1.765 m), weight 201 lb 14.4 oz (91.581 kg).  General appearance: alert and no distress Neck: no adenopathy, no carotid bruit, no JVD, supple, symmetrical, trachea midline and thyroid not enlarged, symmetric, no tenderness/mass/nodules Lungs: clear to auscultation bilaterally Heart: regular rate and rhythm, S1, S2 normal, no murmur, click, rub or gallop Extremities: extremities normal, atraumatic, no cyanosis or edema  EKG normal sinus rhythm at 75 with nonspecific ST and T wave changes  ASSESSMENT AND PLAN:   CAD- s/p CABG X 4 05/12/13 The patient had an abnormal Myoview stress test showing an ejection fraction of 37% with scar in the RCA territory and peri-infarct ischemia. As a result of this come up prior to his provider ram he underwent cardiac catheterization revealing three-vessel disease with moderate LV dysfunction. He ultimately underwent coronary bypass grafting by Dr. Melrose Nakayama on 05/12/13 with a sequential LIMA to the LAD and first diagonal branch, vein to OM1 and being PLA. He did well postoperatively. He denies chest pain or shortness of breath. He is going to participate in chronic rehabilitation. He is attempting to stop smoking.  Claudication The patient had a peripheral angiogram performed on 05/05/13 revealing high-grade bilateral SFA disease with one vessel runoff. Intervention was deferred because of the more urgent need for coronary bypass grafting. The patient is more symptomatically the right side and wishes to have percutaneous and auscultation mid-to-late October which I will  arrange.      [name] MD FACP,FACC,FAHA, [facility] 05/28/2013 5:32 PM

## 2013-05-28 NOTE — Assessment & Plan Note (Signed)
The patient had a peripheral angiogram performed on 05/05/13 revealing high-grade bilateral SFA disease with one vessel runoff. Intervention was deferred because of the more urgent need for coronary bypass grafting. The patient is more symptomatically the right side and wishes to have percutaneous and auscultation mid-to-late October which I will arrange.

## 2013-05-28 NOTE — Patient Instructions (Addendum)
Dr. [name] has ordered a peripheral angiogram to be done at [facility] in late October.  This procedure is going to look at the bloodflow in your lower extremities.  If Dr. [name] is able to open up the arteries, you will have to spend one night in the hospital.  If he is not able to open the arteries, you will be able to go home that same day.    After the procedure, you will not be allowed to drive for 3 days or push, pull, or lift anything greater than 10 lbs for one week.    You will be required to have bloodwork  prior to your procedure.  Our scheduler will advise you on when these items need to be done.      We will see you in the office with an extender prior to the procedure   REPS: SCOTT

## 2013-06-05 ENCOUNTER — Encounter: Payer: Self-pay | Admitting: Cardiovascular Disease

## 2013-06-08 ENCOUNTER — Other Ambulatory Visit: Payer: Self-pay | Admitting: *Deleted

## 2013-06-08 DIAGNOSIS — I2510 Atherosclerotic heart disease of native coronary artery without angina pectoris: Secondary | ICD-10-CM

## 2013-06-10 ENCOUNTER — Encounter: Payer: Self-pay | Admitting: Cardiovascular Disease

## 2013-06-10 ENCOUNTER — Ambulatory Visit (INDEPENDENT_AMBULATORY_CARE_PROVIDER_SITE_OTHER): Payer: Managed Care, Other (non HMO) | Admitting: Surgery

## 2013-06-10 ENCOUNTER — Encounter: Payer: Self-pay | Admitting: Surgery

## 2013-06-10 ENCOUNTER — Ambulatory Visit
Admission: RE | Admit: 2013-06-10 | Discharge: 2013-06-10 | Disposition: A | Discharge status: Home / Self Care | Payer: Managed Care, Other (non HMO) | Source: Ambulatory Visit | Attending: Surgery | Admitting: Surgery

## 2013-06-10 VITALS — BP 129/77 | HR 80 | Resp 16 | Ht 69.0 in | Wt 196.0 lb

## 2013-06-10 DIAGNOSIS — Z951 Presence of aortocoronary bypass graft: Secondary | ICD-10-CM

## 2013-06-10 DIAGNOSIS — I2510 Atherosclerotic heart disease of native coronary artery without angina pectoris: Secondary | ICD-10-CM

## 2013-06-10 NOTE — Telephone Encounter (Signed)
Message forwarded to K. Vogel, RN.  

## 2013-06-10 NOTE — Progress Notes (Signed)
[address]       [name] [address]             [id number]        HPI:  Patient returns for routine postoperative follow-up having undergone coronary bypass graft surgery x4 on 05/12/2013. The patient's early postoperative recovery while in the hospital was notable for an uncomplicated postoperative course. Since hospital discharge the patient reports he has been feeling well and is walking short distances without chest pain or shortness of breath. His ambulation is limited due to severe bilateral lower extremity claudication.   Current Outpatient Prescriptions  Medication Sig Dispense Refill  . aspirin EC 81 MG tablet Take 1 tablet (81 mg total) by mouth daily.  90 tablet  3  . cetirizine (ZYRTEC) 10 MG tablet Take 10 mg by mouth daily.      . cilostazol (PLETAL) 100 MG tablet Take 100 mg by mouth 2 (two) times daily.       . clopidogrel (PLAVIX) 75 MG tablet Take 1 tablet (75 mg total) by mouth daily.  30 tablet  1  . glipiZIDE (GLUCOTROL XL) 10 MG 24 hr tablet Take 10 mg by mouth 2 (two) times daily.      . lansoprazole (PREVACID) 30 MG capsule Take 30 mg by mouth daily.       Marland Kitchen lisinopril (PRINIVIL,ZESTRIL) 40 MG tablet Take 40 mg by mouth daily.       . metFORMIN (GLUCOPHAGE) 1000 MG tablet Take 1 tablet (1,000 mg total) by mouth 2 (two) times daily with a meal.  60 tablet  1  . metoprolol tartrate (LOPRESSOR) 50 MG tablet Take 1 tablet (50 mg total) by mouth 2 (two) times daily.  180 tablet  3  . pioglitazone (ACTOS) 15 MG tablet Take 1 tablet (15 mg total) by mouth 2 (two) times daily after a meal.  60 tablet  1  . pravastatin (PRAVACHOL) 40 MG tablet Take 40 mg by mouth daily.        No current facility-administered medications for this visit.    Physical Exam: BP 129/77  Pulse 80  Resp 16  Ht 5\' 9"  (1.753 m)  Wt 196 lb (88.905 kg)  BMI 28.93 kg/m2  SpO2 98% He looks well. Cardiac exam shows a regular rate and rhythm with normal heart  sounds. Lung exam is clear. Chest incision is healing well and sternum is stable. The right leg incision is healing well and there is no peripheral edema.  Diagnostic Tests:  CLINICAL DATA:  Heart surgery 4 weeks ago, history smoking, type II diabetes, hypertension   EXAM: CHEST  2 VIEW   COMPARISON:  05/14/2013   FINDINGS: Normal heart size post CABG.   Tortuous aorta.   Mediastinal contours and pulmonary vascularity normal.   Scarring left mid lung unchanged.   Underlying COPD changes.   No acute infiltrate, pleural effusion, pneumothorax, or acute osseous findings.   IMPRESSION: COPD changes with scarring in mid left lung.   No acute abnormalities.     Electronically Signed   By: Ulyses Southward M.D.   On: 06/10/2013 09:55     Impression/Plan:  Overall he is doing very well following coronary bypass surgery. I told him he could return to driving a car but asked him not to lift anything heavier than 10 pounds for 3 months postoperatively. He has a followup appointment in a few weeks with Dr. [name] and will have a peripheral  angiogram to decide about intervention for his peripheral vascular disease. He will return to see me if he develops any problems with his incisions.

## 2013-06-18 ENCOUNTER — Other Ambulatory Visit: Payer: Self-pay | Admitting: *Deleted

## 2013-06-18 DIAGNOSIS — Z01818 Encounter for other preprocedural examination: Secondary | ICD-10-CM

## 2013-06-23 ENCOUNTER — Other Ambulatory Visit: Payer: Self-pay | Admitting: Cardiovascular Disease

## 2013-06-23 LAB — CBC
HCT: 37.5 % — ABNORMAL LOW (ref 39.0–52.0)
MCHC: 33.1 g/dL (ref 30.0–36.0)
Platelets: 311 10*3/uL (ref 150–400)
RDW: 13.3 % (ref 11.5–15.5)
WBC: 6.1 10*3/uL (ref 4.0–10.5)

## 2013-06-23 LAB — PROTIME-INR: INR: 0.99 (ref <1.50)

## 2013-06-24 LAB — BASIC METABOLIC PANEL
BUN: 13 mg/dL (ref 6–23)
Calcium: 9.4 mg/dL (ref 8.4–10.5)
Chloride: 100 mEq/L (ref 96–112)
Creat: 0.88 mg/dL (ref 0.50–1.35)

## 2013-06-26 ENCOUNTER — Encounter: Payer: Self-pay | Admitting: Cardiology

## 2013-06-26 ENCOUNTER — Encounter (HOSPITAL_COMMUNITY): Payer: Self-pay | Admitting: Respiratory Therapy

## 2013-06-26 ENCOUNTER — Other Ambulatory Visit (HOSPITAL_COMMUNITY): Payer: Self-pay | Admitting: *Deleted

## 2013-06-26 ENCOUNTER — Other Ambulatory Visit (HOSPITAL_COMMUNITY): Payer: Self-pay | Admitting: Cardiology

## 2013-06-26 ENCOUNTER — Ambulatory Visit (INDEPENDENT_AMBULATORY_CARE_PROVIDER_SITE_OTHER): Payer: Managed Care, Other (non HMO) | Admitting: Cardiology

## 2013-06-26 ENCOUNTER — Ambulatory Visit (HOSPITAL_COMMUNITY)
Admission: RE | Admit: 2013-06-26 | Discharge: 2013-06-26 | Disposition: A | Discharge status: Home / Self Care | Payer: Managed Care, Other (non HMO) | Source: Ambulatory Visit | Attending: Internal Medicine | Admitting: Internal Medicine

## 2013-06-26 VITALS — BP 136/70 | HR 62 | Ht 69.0 in | Wt 198.0 lb

## 2013-06-26 DIAGNOSIS — I2510 Atherosclerotic heart disease of native coronary artery without angina pectoris: Secondary | ICD-10-CM

## 2013-06-26 DIAGNOSIS — M79609 Pain in unspecified limb: Secondary | ICD-10-CM

## 2013-06-26 DIAGNOSIS — M79661 Pain in right lower leg: Secondary | ICD-10-CM

## 2013-06-26 DIAGNOSIS — Z720 Tobacco use: Secondary | ICD-10-CM

## 2013-06-26 DIAGNOSIS — Z01818 Encounter for other preprocedural examination: Secondary | ICD-10-CM

## 2013-06-26 DIAGNOSIS — I739 Peripheral vascular disease, unspecified: Secondary | ICD-10-CM

## 2013-06-26 DIAGNOSIS — F17200 Nicotine dependence, unspecified, uncomplicated: Secondary | ICD-10-CM

## 2013-06-26 DIAGNOSIS — M25569 Pain in unspecified knee: Secondary | ICD-10-CM

## 2013-06-26 NOTE — Assessment & Plan Note (Signed)
Quit, former 2 ppd smoker, now tapering Nicotine with E-cig

## 2013-06-26 NOTE — Assessment & Plan Note (Signed)
Rt > Lt 

## 2013-06-26 NOTE — Progress Notes (Signed)
Quick Note:  Patient had pre procedure workup done in office today with [facility]. Results discussed @visit . ______

## 2013-06-26 NOTE — Assessment & Plan Note (Signed)
No angina 

## 2013-06-26 NOTE — Assessment & Plan Note (Signed)
R/O DVT

## 2013-06-26 NOTE — Patient Instructions (Signed)
Dr Hazle Coca RN will contact you about PV angiogram

## 2013-06-26 NOTE — Assessment & Plan Note (Signed)
Here for pre op H&P prior to PVA/PTA

## 2013-06-26 NOTE — Progress Notes (Signed)
Right Lower Ext. Venous Duplex Completed.  Negative for DVT.

## 2013-06-26 NOTE — Progress Notes (Signed)
06/26/2013 Dennis [name]   11-11-1945  [id number]  Primary Physicia Stanford [place], MD Primary Cardiologist: Dr [name]  HPI:  67 y/o referred to [place] in July 2013 for Rt leg claudication. Dopplers suggested significant disease. He had multiple cardiac risk factors and a Myoview was done which was abnormal with an EF of 37% and a large area of ischemia. Combined PVA and coronary angiogram was done 04/25/13 and revealed 90% Lt SFA and 99% mid Rt SFA stenosis. 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 He underwent CABG x 4 05/12/13 with LIMA-LAD/Dx, SVG-OM, SVG-PL. He has recovered well. He was a 2 ppd smoker and now is quitting using E-cigs. He has had no problems since Dr [name] saw him 05/28/13. He does complain of Rt calf pain with palpation, no SOB or hemoptysis.   Current Outpatient Prescriptions  Medication Sig Dispense Refill  . aspirin EC 81 MG tablet Take 1 tablet (81 mg total) by mouth daily.  90 tablet  3  . cetirizine (ZYRTEC) 10 MG tablet Take 10 mg by mouth daily.      . cilostazol (PLETAL) 100 MG tablet Take 100 mg by mouth 2 (two) times daily.       . clopidogrel (PLAVIX) 75 MG tablet Take 1 tablet (75 mg total) by mouth daily.  30 tablet  1  . glipiZIDE (GLUCOTROL XL) 10 MG 24 hr tablet Take 10 mg by mouth 2 (two) times daily.      . lansoprazole (PREVACID) 30 MG capsule Take 30 mg by mouth daily.       Marland Kitchen lisinopril (PRINIVIL,ZESTRIL) 40 MG tablet Take 40 mg by mouth daily.       . metFORMIN (GLUCOPHAGE) 1000 MG tablet Take 1 tablet (1,000 mg total) by mouth 2 (two) times daily with a meal.  60 tablet  1  . metoprolol tartrate (LOPRESSOR) 50 MG tablet Take 1 tablet (50 mg total) by mouth 2 (two) times daily.  180 tablet  3  . pioglitazone (ACTOS) 15 MG tablet Take 1 tablet (15 mg total) by mouth 2 (two) times daily after a meal.  60 tablet  1  . pravastatin (PRAVACHOL) 40 MG tablet Take 40 mg by mouth daily.        No current facility-administered medications for this visit.     Allergies  Allergen Reactions  . Chlorhexidine Rash    History   Social History  . Marital Status: Married    Spouse Name: N/A    Number of Children: N/A  . Years of Education: N/A   Occupational History  . Not on file.   Social History Main Topics  . Smoking status: Former Smoker -- 1.00 packs/day for 55 years    Types: Cigarettes    Quit date: 05/05/2013  . Smokeless tobacco: Not on file     Comment: now using E- Cig.  . Alcohol Use: No  . Drug Use: No  . Sexual Activity: Not on file   Other Topics Concern  . Not on file   Social History Narrative  . No narrative on file     Review of Systems: General: negative for chills, fever, night sweats or weight changes.  Cardiovascular: negative for chest pain, dyspnea on exertion, edema, orthopnea, palpitations, paroxysmal nocturnal dyspnea or shortness of breath Dermatological: negative for rash Respiratory: negative for cough or wheezing Urologic: negative for hematuria Abdominal: negative for nausea, vomiting, diarrhea, bright red blood per rectum, melena, or hematemesis Neurologic: negative for  visual changes, syncope, or dizziness All other systems reviewed and are otherwise negative except as noted above.    Blood pressure 136/70, pulse 62, height 5\' 9"  (1.753 m), weight 198 lb (89.812 kg).  General appearance: alert, cooperative and no distress Lungs: clear to auscultation bilaterally Heart: regular rate and rhythm Abdomen: soft, non-tender; bowel sounds normal; no masses,  no organomegaly Extremities: tenderness with palpation Rt calf Pulses: diminnished Skin: Skin color, texture, turgor normal. No rashes or lesions Neurologic: Grossly normal  EKG NSR  ASSESSMENT AND PLAN:   Calf pain on Rt -  R/O DVT  Claudication Rt > Lt  PVD - bilat SFA disease Here for pre op H&P prior to PVA/PTA  CAD- s/p CABG X 4 05/12/13 No angina  Tobacco abuse Quit, former 2 ppd smoker, now tapering Nicotine with  E-cig   PLAN  Check venous doppler Rt calf today. He has Rt calf tenderness and is S/P major surgery and at moderate risk for DVT. He will be cleared for PVA if this is negative.  Dennis [name] KPA-C 06/26/2013 10:55 AM  Rt LE venous doppler negative for DVT

## 2013-06-30 ENCOUNTER — Encounter (HOSPITAL_COMMUNITY)
Admission: RE | Disposition: A | Discharge status: Home / Self Care | Payer: Self-pay | Source: Ambulatory Visit | Attending: Cardiovascular Disease

## 2013-06-30 ENCOUNTER — Encounter (HOSPITAL_COMMUNITY): Payer: Self-pay | Admitting: General Practice

## 2013-06-30 ENCOUNTER — Ambulatory Visit (HOSPITAL_COMMUNITY)
Admission: RE | Admit: 2013-06-30 | Discharge: 2013-07-01 | Disposition: A | Discharge status: Home / Self Care | Payer: Managed Care, Other (non HMO) | Source: Ambulatory Visit | Attending: Cardiovascular Disease | Admitting: Cardiovascular Disease

## 2013-06-30 DIAGNOSIS — Z79899 Other long term (current) drug therapy: Secondary | ICD-10-CM | POA: Insufficient documentation

## 2013-06-30 DIAGNOSIS — I70219 Atherosclerosis of native arteries of extremities with intermittent claudication, unspecified extremity: Secondary | ICD-10-CM | POA: Insufficient documentation

## 2013-06-30 DIAGNOSIS — E785 Hyperlipidemia, unspecified: Secondary | ICD-10-CM | POA: Insufficient documentation

## 2013-06-30 DIAGNOSIS — Z951 Presence of aortocoronary bypass graft: Secondary | ICD-10-CM | POA: Insufficient documentation

## 2013-06-30 DIAGNOSIS — I739 Peripheral vascular disease, unspecified: Secondary | ICD-10-CM | POA: Diagnosis present

## 2013-06-30 DIAGNOSIS — E1151 Type 2 diabetes mellitus with diabetic peripheral angiopathy without gangrene: Secondary | ICD-10-CM

## 2013-06-30 DIAGNOSIS — Z01818 Encounter for other preprocedural examination: Secondary | ICD-10-CM

## 2013-06-30 DIAGNOSIS — F17200 Nicotine dependence, unspecified, uncomplicated: Secondary | ICD-10-CM | POA: Insufficient documentation

## 2013-06-30 DIAGNOSIS — Z7902 Long term (current) use of antithrombotics/antiplatelets: Secondary | ICD-10-CM | POA: Insufficient documentation

## 2013-06-30 DIAGNOSIS — E1159 Type 2 diabetes mellitus with other circulatory complications: Secondary | ICD-10-CM | POA: Diagnosis present

## 2013-06-30 DIAGNOSIS — IMO0002 Reserved for concepts with insufficient information to code with codable children: Secondary | ICD-10-CM

## 2013-06-30 DIAGNOSIS — M79661 Pain in right lower leg: Secondary | ICD-10-CM

## 2013-06-30 DIAGNOSIS — I2510 Atherosclerotic heart disease of native coronary artery without angina pectoris: Secondary | ICD-10-CM | POA: Insufficient documentation

## 2013-06-30 DIAGNOSIS — I10 Essential (primary) hypertension: Secondary | ICD-10-CM | POA: Insufficient documentation

## 2013-06-30 HISTORY — PX: PERIPHERAL ATHRECTOMY: SHX6227

## 2013-06-30 HISTORY — PX: LOWER EXTREMITY ANGIOGRAM: SHX5508

## 2013-06-30 LAB — GLUCOSE, CAPILLARY: Glucose-Capillary: 290 mg/dL — ABNORMAL HIGH (ref 70–99)

## 2013-06-30 LAB — POCT ACTIVATED CLOTTING TIME
Activated Clotting Time: 227 seconds
Activated Clotting Time: 206 seconds

## 2013-06-30 SURGERY — ANGIOGRAM, LOWER EXTREMITY
Anesthesia: LOCAL | Laterality: Right

## 2013-06-30 MED ORDER — SODIUM CHLORIDE 0.9 % IV SOLN: INTRAVENOUS | Status: AC

## 2013-06-30 MED ORDER — ASPIRIN 81 MG PO CHEW: 81.0000 mg | CHEWABLE_TABLET | ORAL | Status: DC

## 2013-06-30 MED ORDER — HEPARIN (PORCINE) IN NACL 2-0.9 UNIT/ML-% IJ SOLN
INTRAMUSCULAR | Status: AC
  Filled 2013-06-30: qty 1000

## 2013-06-30 MED ORDER — HEPARIN SODIUM (PORCINE) 1000 UNIT/ML IJ SOLN
INTRAMUSCULAR | Status: AC
  Filled 2013-06-30: qty 1

## 2013-06-30 MED ORDER — PANTOPRAZOLE SODIUM 20 MG PO TBEC
20.0000 mg | DELAYED_RELEASE_TABLET | Freq: Every day | ORAL | Status: DC
  Administered 2013-07-01: 09:00:00 20 mg via ORAL
  Filled 2013-06-30: qty 1

## 2013-06-30 MED ORDER — PIOGLITAZONE HCL 15 MG PO TABS
15.0000 mg | ORAL_TABLET | Freq: Two times a day (BID) | ORAL | Status: DC
  Administered 2013-06-30 – 2013-07-01 (×2): 15 mg via ORAL
  Filled 2013-06-30 (×4): qty 1

## 2013-06-30 MED ORDER — MIDAZOLAM HCL 2 MG/2ML IJ SOLN
INTRAMUSCULAR | Status: AC
  Filled 2013-06-30: qty 2

## 2013-06-30 MED ORDER — CILOSTAZOL 100 MG PO TABS
100.0000 mg | ORAL_TABLET | Freq: Two times a day (BID) | ORAL | Status: DC
  Administered 2013-06-30 – 2013-07-01 (×2): 100 mg via ORAL
  Filled 2013-06-30 (×3): qty 1

## 2013-06-30 MED ORDER — FENTANYL CITRATE 0.05 MG/ML IJ SOLN
INTRAMUSCULAR | Status: AC
  Filled 2013-06-30: qty 2

## 2013-06-30 MED ORDER — CLOPIDOGREL BISULFATE 75 MG PO TABS: 75.0000 mg | ORAL_TABLET | Freq: Every day | ORAL | Status: DC

## 2013-06-30 MED ORDER — INSULIN ASPART 100 UNIT/ML ~~LOC~~ SOLN
0.0000 [IU] | Freq: Three times a day (TID) | SUBCUTANEOUS | Status: DC
  Administered 2013-07-01: 7 [IU] via SUBCUTANEOUS

## 2013-06-30 MED ORDER — SODIUM CHLORIDE 0.9 % IV SOLN
INTRAVENOUS | Status: DC
  Administered 2013-06-30: 07:00:00 via INTRAVENOUS

## 2013-06-30 MED ORDER — ASPIRIN EC 81 MG PO TBEC: 81.0000 mg | DELAYED_RELEASE_TABLET | Freq: Every day | ORAL | Status: DC

## 2013-06-30 MED ORDER — LIVING WELL WITH DIABETES BOOK
Freq: Once | Status: AC
  Administered 2013-06-30: 23:00:00
  Filled 2013-06-30: qty 1

## 2013-06-30 MED ORDER — ACETAMINOPHEN 325 MG PO TABS: 650.0000 mg | ORAL_TABLET | ORAL | Status: DC | PRN

## 2013-06-30 MED ORDER — METOPROLOL TARTRATE 50 MG PO TABS
50.0000 mg | ORAL_TABLET | Freq: Two times a day (BID) | ORAL | Status: DC
  Administered 2013-06-30 – 2013-07-01 (×2): 50 mg via ORAL
  Filled 2013-06-30 (×3): qty 1

## 2013-06-30 MED ORDER — ONDANSETRON HCL 4 MG/2ML IJ SOLN: 4.0000 mg | Freq: Four times a day (QID) | INTRAMUSCULAR | Status: DC | PRN

## 2013-06-30 MED ORDER — ASPIRIN EC 325 MG PO TBEC
325.0000 mg | DELAYED_RELEASE_TABLET | Freq: Every day | ORAL | Status: DC
  Administered 2013-07-01: 09:00:00 325 mg via ORAL
  Filled 2013-06-30: qty 1

## 2013-06-30 MED ORDER — LISINOPRIL 40 MG PO TABS
40.0000 mg | ORAL_TABLET | Freq: Every day | ORAL | Status: DC
  Administered 2013-07-01: 09:00:00 40 mg via ORAL
  Filled 2013-06-30: qty 1

## 2013-06-30 MED ORDER — GLIPIZIDE ER 10 MG PO TB24
10.0000 mg | ORAL_TABLET | Freq: Two times a day (BID) | ORAL | Status: DC
  Administered 2013-06-30 – 2013-07-01 (×2): 10 mg via ORAL
  Filled 2013-06-30 (×4): qty 1

## 2013-06-30 MED ORDER — INSULIN ASPART 100 UNIT/ML ~~LOC~~ SOLN
0.0000 [IU] | Freq: Every day | SUBCUTANEOUS | Status: DC
  Administered 2013-06-30: 22:00:00 4 [IU] via SUBCUTANEOUS

## 2013-06-30 MED ORDER — MORPHINE SULFATE 2 MG/ML IJ SOLN: 1.0000 mg | INTRAMUSCULAR | Status: DC | PRN

## 2013-06-30 MED ORDER — CLOPIDOGREL BISULFATE 75 MG PO TABS
75.0000 mg | ORAL_TABLET | Freq: Every day | ORAL | Status: DC
  Administered 2013-07-01: 75 mg via ORAL
  Filled 2013-06-30: qty 1

## 2013-06-30 MED ORDER — DIAZEPAM 5 MG PO TABS
5.0000 mg | ORAL_TABLET | ORAL | Status: AC
  Administered 2013-06-30: 5 mg via ORAL

## 2013-06-30 MED ORDER — SODIUM CHLORIDE 0.9 % IJ SOLN: 3.0000 mL | INTRAMUSCULAR | Status: DC | PRN

## 2013-06-30 MED ORDER — INSULIN ASPART 100 UNIT/ML ~~LOC~~ SOLN
0.0000 [IU] | Freq: Once | SUBCUTANEOUS | Status: AC
  Administered 2013-06-30: 5 [IU] via SUBCUTANEOUS

## 2013-06-30 MED ORDER — LORATADINE 10 MG PO TABS
10.0000 mg | ORAL_TABLET | Freq: Every day | ORAL | Status: DC
  Administered 2013-07-01: 10 mg via ORAL
  Filled 2013-06-30: qty 1

## 2013-06-30 MED ORDER — LIDOCAINE HCL (PF) 1 % IJ SOLN
INTRAMUSCULAR | Status: AC
  Filled 2013-06-30: qty 30

## 2013-06-30 NOTE — Progress Notes (Signed)
Pt states he has never had diabetes education and demonstrates no basic knowledge of the disease. States his wife is a Engineer, civil (consulting) and is knowledgeable about diabetes but he is not.  Discussed normal CBG range, importance of checking blood glucose, risk of blindness, limb amputation, heart attack and stroke from chronic high glucose.  Pt states has CBG machine "somewhere" at home but never checks sugar.  Discussed basic diet heart healthy and low carb choices.  Pt states quit smoking in August and has been using e-cigarette since.  Advised pt to only use e-cigarette as needed and to wean off of it due to possible health risks as well.  Smoking cessation tip sheet and Living well with diabetes book given, encouraged pt to read and also watch diabetes education videos.  States he's tired and will watch in AM.

## 2013-06-30 NOTE — H&P (Signed)
    Pt was reexamined and existing H & P reviewed. No changes found.  [name], MD [facility] 06/30/2013 11:19 AM

## 2013-06-30 NOTE — CV Procedure (Signed)
Dennis [name] is a 67 y.o. male    [id number] LOCATION:  FACILITY: [facility]  PHYSICIAN: Nanetta Batty, M.D. [birth year]   DATE OF PROCEDURE:  06/30/2013  DATE OF DISCHARGE:     PV INTERVENTION    History obtained from chart review.[name] is a 67 year old married Caucasian male father of one, grandmother, grandfather is accompanied by his wife today. He was referred by [facility] at [facility] for evaluation of claudication and arterial Doppler studies which were obtained in our office 04/07/13. His cardiovascular risk factors include type 2 diabetes, hypertension, and hyperlipidemia. He has a 50-100-pack-year history of tobacco abuse currently smoking one pack to 2 packs a day. There is no family history of heart disease. He's never had a heart attack or stroke. He does complain of dyspnea on exertion. He has had claudication for the last 2 years worse over the last 3 months which is now lifestyle limiting. Doppler studies in our office performed 04/07/13 revealed a right ABI of 0.63 and a left ABI of 0.70. He had high-grade SFA disease bilaterally as well as tibial disease.because of a positive Myoview stress test the patient first had a diagnostic coronary arteriogram. Which showed 3 vessel disease with moderate LV dysfunction. He'll underwent coronary bypass grafting x4 by Dr. Melrose Nakayama on 05/12/13 with a sequential LIMA to the LAD and diagonal branch, vein to obtuse marginal branch and to the PDA. His postop course was uncomplicated. He still has lifestyle limiting claudication right greater than left and wishes to have his right lower extremity revascularized. Angiography did reveal high-grade bilateral SFA disease as well as tibial vessel disease. He presents today for angiography, directional arthrectomy using TurboHawk  device of his right SFA    PROCEDURE DESCRIPTION:    The patient was brought to the second floor  Cardiac cath lab in the postabsorptive state. He was  premedicated with Valium 5 mg by mouth, IV Versed and fentanyl. His left groinwas prepped and shaved in usual sterile fashion. Xylocaine 1% was used for local anesthesia. A 7 French 55 cm Ansel sheath was inserted into the left common femoral artery using standard Seldinger technique.a 5 French pigtail catheter was used to perform bilateral lower extremity angiography using bolus chase digital subtraction step table technique. Visipaque dye was used for the entirety of the case. Retrograde aortic pressure was monitored during the case.   HEMODYNAMICS:    AO SYSTOLIC/AO DIASTOLIC: 167/89     ANGIOGRAPHIC RESULTS:   1: Left lower extremity-70% segmental proximal, 80% mid left SFA. There was a 60-70% ulcerative plaque in the P2 segment of the left popliteal artery. There was one vessel runoff via the peroneal artery.  2: Right lower extremity-there was a 40% segmental proximal, 95% fairly focal proximal to mid and 80% segmental mid right SFA stenosis with one vessel runoff via the perineal.   IMPRESSION:bilateral high-grade SFA disease with lifestyle limiting claudication. We will proceed with directional turbo hawk atherectomy.  Procedure description: Contralateral access was obtained with a crossover catheter, 035 Glidewire, 5 French endhole Catheter, 035 Rosen wire. The patient received a total of 10,000 units of heparin intravenously with an ending ACT of 206. Total contrast administered the patient was 228 cc. Using a LXM turbo Hawk directional atherectomy device multiple cuts were performed in various directions of the proximal and mid right SFA. I was able to remove a large amount of atheromatous appearing plaque. The final angiogram revealed reduction of 95 and 80% stenoses to less than  10% residual without dissection. There was a residual 50-60% segmental stenosis in the distal right SFA. There was good runoff. A 6 mm spider distal protection device was used. The distal protection device was  then recaptured and the sheath was withdrawn across the bifurcation over an 035 Versicore wire . This was then exchanged for a short 7 [place] sheath.  Final impression: Successful turbo hawked directional atherectomy of proximal and mid right SFA removing a large amount of atheromatous plaque for lifestyle limiting claudication. The patient is already on aspirin and Plavix. The sheath will be removed once the ACT falls below 170 and pressure will be held on the groin to achieve hemostasis. The patient left the Cath Lab in stable condition. He'll be hydrated overnight and discharged home in the morning. I will obtain outpatient arterial Doppler studies after which he will see me back in the office. We will then consider staged left SFA intervention.  [name] MD, [facility] 06/30/2013 11:24 AM

## 2013-07-01 ENCOUNTER — Encounter (HOSPITAL_COMMUNITY): Payer: Self-pay | Admitting: Pharmacy Technician

## 2013-07-01 ENCOUNTER — Other Ambulatory Visit: Payer: Self-pay | Admitting: Cardiology

## 2013-07-01 DIAGNOSIS — I739 Peripheral vascular disease, unspecified: Secondary | ICD-10-CM

## 2013-07-01 DIAGNOSIS — M79609 Pain in unspecified limb: Secondary | ICD-10-CM

## 2013-07-01 DIAGNOSIS — I2510 Atherosclerotic heart disease of native coronary artery without angina pectoris: Secondary | ICD-10-CM

## 2013-07-01 DIAGNOSIS — Z01818 Encounter for other preprocedural examination: Secondary | ICD-10-CM

## 2013-07-01 LAB — BASIC METABOLIC PANEL
BUN: 15 mg/dL (ref 6–23)
Chloride: 103 mEq/L (ref 96–112)
Creatinine, Ser: 0.94 mg/dL (ref 0.50–1.35)
GFR calc Af Amer: >90 mL/min (ref >90)
GFR calc non Af Amer: 84 mL/min — ABNORMAL LOW (ref >90)

## 2013-07-01 LAB — GLUCOSE, CAPILLARY: Glucose-Capillary: 321 mg/dL — ABNORMAL HIGH (ref 70–99)

## 2013-07-01 LAB — POCT ACTIVATED CLOTTING TIME
Activated Clotting Time: 176 seconds
Activated Clotting Time: 181 seconds
Activated Clotting Time: 191 seconds

## 2013-07-01 LAB — CBC
HCT: 37.4 % — ABNORMAL LOW (ref 39.0–52.0)
MCH: 28.8 pg (ref 26.0–34.0)
MCHC: 34.8 g/dL (ref 30.0–36.0)
Platelets: 252 10*3/uL (ref 150–400)
RDW: 12.9 % (ref 11.5–15.5)

## 2013-07-01 NOTE — Progress Notes (Signed)
Subjective: No complaints. He denies groin, back and flank pain. No pain with walking.   Objective: Vital signs in last 24 hours: Temp:  [97.6 F (36.4 C)-98.3 F (36.8 C)] 97.6 F (36.4 C) (10/15 0417) Pulse Rate:  [77-104] 100 (10/15 0417) Resp:  [16-20] 20 (10/15 0417) BP: (120-174)/(68-88) 120/68 mmHg (10/15 0417) SpO2:  [93 %-96 %] 94 % (10/15 0417) Weight:  [198 lb 6.6 oz (90 kg)] 198 lb 6.6 oz (90 kg) (10/15 0012) Last BM Date: 06/29/13  Intake/Output from previous day: 10/14 0701 - 10/15 0700 In: 1010 [P.O.:360; I.V.:650] Out: 500 [Urine:500] Intake/Output this shift:    Medications Current Facility-Administered Medications  Medication Dose Route Frequency Provider Last Rate Last Dose  . acetaminophen (TYLENOL) tablet 650 mg  650 mg Oral Q4H PRN [name], MD      . aspirin EC tablet 325 mg  325 mg Oral Daily [name], MD      . cilostazol (PLETAL) tablet 100 mg  100 mg Oral BID [name], MD   100 mg at 06/30/13 2136  . clopidogrel (PLAVIX) tablet 75 mg  75 mg Oral Daily [name], MD      . glipiZIDE (GLUCOTROL XL) 24 hr tablet 10 mg  10 mg Oral BID WC [name], MD   10 mg at 06/30/13 1738  . insulin aspart (novoLOG) injection 0-5 Units  0-5 Units Subcutaneous QHS Darrol Jump, PA-C   4 Units at 06/30/13 2218  . insulin aspart (novoLOG) injection 0-9 Units  0-9 Units Subcutaneous TID WC Rhonda G Barrett, PA-C      . lisinopril (PRINIVIL,ZESTRIL) tablet 40 mg  40 mg Oral Daily [name], MD      . loratadine (CLARITIN) tablet 10 mg  10 mg Oral Daily [name], MD      . metoprolol (LOPRESSOR) tablet 50 mg  50 mg Oral BID [name], MD   50 mg at 06/30/13 2136  . morphine 2 MG/ML injection 1 mg  1 mg Intravenous Q1H PRN [name], MD      . ondansetron [facility]) injection 4 mg  4 mg Intravenous Q6H PRN [name], MD      . pantoprazole (PROTONIX) EC tablet 20 mg  20 mg Oral Daily [name], MD      . pioglitazone (ACTOS) tablet 15 mg  15 mg Oral BID PC [name], MD   15 mg at 06/30/13 1738    PE: General appearance: alert, cooperative and no distress Lungs: clear to auscultation bilaterally Heart: regular rate and rhythm, S1, S2 normal, no murmur, click, rub or gallop Extremities: no LEE Pulses: 2+ and symmetric Skin: warm and dry Neurologic: Grossly normal  Lab Results:   Recent Labs  07/01/13 0400  WBC 7.4  HGB 13.0  HCT 37.4*  PLT 252   BMET  Recent Labs  07/01/13 0400  NA 137  K 4.1  CL 103  CO2 22  GLUCOSE 280*  BUN 15  CREATININE 0.94  CALCIUM 8.9    Studies/Results:  PV Angio 06/30/13 HEMODYNAMICS:  AO SYSTOLIC/AO DIASTOLIC: 167/89  ANGIOGRAPHIC RESULTS:  1: Left lower extremity-70% segmental proximal, 80% mid left SFA. There was a 60-70% ulcerative plaque in the P2 segment of the left popliteal artery. There was one vessel runoff via the peroneal artery.  2: Right lower extremity-there was a 40% segmental proximal, 95% fairly focal proximal to  mid and 80% segmental mid right SFA stenosis with one vessel runoff via the perineal.  IMPRESSION:bilateral high-grade SFA disease with lifestyle limiting claudication. We will proceed with directional turbo hawk atherectomy   Assessment/Plan  Principal Problem:   PVD - bilat SFA disease - s/p PCTA of right SFA 06/30/13 Active Problems:   Claudication  Plan:  S/p successful turbo hawked directional atherectomy of proximal and mid right SFA removing a large amount of atheromatous plaque for lifestyle limiting claudication. No pain. He has been ambulating w/o difficulty. The left femoral access site is stable. Continue ASA and Plavix. Renal function is stable. Discussion was held with patient on diabetes education and continued smoking cessation. Plan for discharge later today with OP LEA and f/u with Dr. [name]. Also ? If patient should be continued on Actos for diabetes. He has h/o  ischemic cardiomyopathy. Last documented EF was via NST in 8/14 and was estimated at 37%. Will discuss with Dr. [name].      LOS: 1 day    Brittainy M. Delmer Islam 07/01/2013 8:23 AM  Agree with note written by Boyce Medici  [facility]  Looks great!!! S/P RSFA turbohawk directional atherectomy RSFA. He has a palpable RPP. Left groin OK. Labs OK. D/C home on ASA and plavix. He wants Left leg done next week which we will work on.   [name] 07/01/2013 10:15 AM

## 2013-07-01 NOTE — Discharge Summary (Signed)
Physician Discharge Summary     Patient ID: Dennis [name] MRN: [id number] DOB/AGE: 68-Nov-1947 68 y.o.  Admit date: 06/30/2013 Discharge date: 07/01/2013  Admission Diagnoses: Peripheral Vascular Disease  Discharge Diagnoses:  Principal Problem:   PVD - bilat SFA disease - s/p PCTA of right SFA 06/30/13 Active Problems:   Claudication   Uncontrolled diabetes mellitus type 2 with peripheral artery disease- HGB A1c 12 on admission   Discharged Condition: stable  Hospital Course: The patient is a 67 y/o male with a history of CAD, s/p CABG x 4 in 04/2013. He also has type 2 diabetes, HTN, HLD and a past history of tobacco abuse. He was referred to Dr. [name] by Dr. Al Corpus at [facility] for evaluation of claudication. He has had claudication for the last 2 years, worse over the last 3 months which is now lifestyle limiting. Doppler studies in our office performed 04/07/13 revealed a right ABI of 0.63 and a left ABI of 0.70. He had high-grade SFA disease bilaterally as well as tibial disease. He presented to [facility] on 06/30/13 for a planned PV angio. The procedure was performed by Dr. [name]. In the left lower extremity, there was 70% proximal stenosis, 80% in the the mid left SFA. There was also a 60-70% ulcerative plaque in the P2 segment of the left popliteal artery. There was one vessel runoff via the peroneal artery. In the right lower extremity, there was 40% stenosis in the proximal portion, 80 and 95% segmental stenoses in the mid SFA with one vessel runoff via the perineal. He underwent successful turbo hawked directional atherectomy of the proximal and mid right SFA, removing a large amount of atheromatous plaque. He left the cath lab in stable conidtion. He was resumed on ASA and Plavix. He was kept overnight for hydration. He had no complications. The left femoral access site remained stable. He had no difficulty ambulating. Renal function remained stable. He was last seen and examined by  Dr. [name], who determined that he was stable for discharge home. The patient will present back to [facility] on 07/07/13 to undergo intervention on the left SFA.    Consults: None  Significant Diagnostic Studies:   PV Angio 06/30/13 HEMODYNAMICS:  AO SYSTOLIC/AO DIASTOLIC: 167/89  ANGIOGRAPHIC RESULTS:  1: Left lower extremity-70% segmental proximal, 80% mid left SFA. There was a 60-70% ulcerative plaque in the P2 segment of the left popliteal artery. There was one vessel runoff via the peroneal artery.  2: Right lower extremity-there was a 40% segmental proximal, 95% fairly focal proximal to mid and 80% segmental mid right SFA stenosis with one vessel runoff via the perineal.  IMPRESSION:bilateral high-grade SFA disease with lifestyle limiting claudication. We will proceed with directional turbo hawk atherectomy.    Treatments: See Hospital Course  Discharge Exam: Blood pressure 144/92, pulse 100, temperature 97.7 F (36.5 C), temperature source Oral, resp. rate 20, height 5\' 9"  (1.753 m), weight 198 lb 6.6 oz (90 kg), SpO2 94.00%.   Disposition: 01-Home or Self Care      Discharge Orders   Future Orders Complete By Expires   Ambulatory referral to Nutrition and Diabetic Education  As directed    Comments:     CABG back in August.  Was supposed to follow up at [facility] after August.  Patient now interested in OP DM education.   Last A1c 12.4% (05/06/13)  Patient: Please call the [facility] after discharge to schedule an appointment for diabetes education  if you do not hear from the center before discharge  [phone]   Diet - low sodium heart healthy  As directed    Discharge instructions  As directed    Comments:     Wait until 10/17 to resume Metformin   Driving Restrictions  As directed    Comments:     No driving for 3 days   Increase activity slowly  As directed    Lifting restrictions  As directed    Comments:     No lifting more  than 5 lbs for 3 days       Medication List         aspirin EC 81 MG tablet  Take 1 tablet (81 mg total) by mouth daily.     cetirizine 10 MG tablet  Commonly known as:  ZYRTEC  Take 10 mg by mouth daily.     cilostazol 100 MG tablet  Commonly known as:  PLETAL  Take 100 mg by mouth 2 (two) times daily.     clopidogrel 75 MG tablet  Commonly known as:  PLAVIX  Take 1 tablet (75 mg total) by mouth daily.     glipiZIDE 10 MG 24 hr tablet  Commonly known as:  GLUCOTROL XL  Take 10 mg by mouth 2 (two) times daily.     lansoprazole 30 MG capsule  Commonly known as:  PREVACID  Take 30 mg by mouth daily.     lisinopril 40 MG tablet  Commonly known as:  PRINIVIL,ZESTRIL  Take 40 mg by mouth daily.     metFORMIN 1000 MG tablet  Commonly known as:  GLUCOPHAGE  Take 1 tablet (1,000 mg total) by mouth 2 (two) times daily with a meal.     metoprolol 50 MG tablet  Commonly known as:  LOPRESSOR  Take 1 tablet (50 mg total) by mouth 2 (two) times daily.     pioglitazone 15 MG tablet  Commonly known as:  ACTOS  Take 1 tablet (15 mg total) by mouth 2 (two) times daily after a meal.     pravastatin 40 MG tablet  Commonly known as:  PRAVACHOL  Take 40 mg by mouth daily.       Follow-up Information   Follow up with [name], MD On 07/07/2013. (arrive at [facility] at 11:00 am for procedure with Dr. [name])    Specialty:  Cardiology   Contact information:   [address] [place] [address] [phone]      TIME SPENT ON DISCHARGE, INCLUDING PHYSICIAN TIME: >30 MINUTES  Signed: Allayne Butcher, PA-C 07/01/2013, 11:32 AM

## 2013-07-01 NOTE — Progress Notes (Signed)
Inpatient Diabetes Program Recommendations  AACE/ADA: New Consensus Statement on Inpatient Glycemic Control (2013)  Target Ranges:  Prepandial:   less than 140 mg/dL      Peak postprandial:   less than 180 mg/dL (1-2 hours)      Critically ill patients:  140 - 180 mg/dL     Results for Dennis [name], Dennis [name] (MRN [id number]) as of 07/01/2013 10:17  Ref. Range 06/30/2013 07:17 06/30/2013 11:38 06/30/2013 17:41 06/30/2013 21:56  Glucose-Capillary Latest Range: 70-99 mg/dL 657 (H) 846 (H) 962 (H) 328 (H)    **Noted patient was here in August for CABG surgery.  A1c was 12.4% on 05/06/13.  DM Coordinator spoke with patient during that admission and recommended patient follow up with OP DM education at the [facility].  Not sure if patient ever followed up.  No notes form the OP center stating that he did so.  **Noted patient now has an interest in attending OP DM classes after d/c again.  Have placed OP DM education referral.  Center to contact patient to make appointment.  **Noted night shift RN has reviewed basic DM concepts with this patient.  Plan for d/c home today.   Will follow. Ambrose Finland RN, MSN, CDE Diabetes Coordinator Inpatient Diabetes Program Team Pager: [phone] (8a-10p)

## 2013-07-01 NOTE — Discharge Instructions (Signed)
You will need to arrive at [facility] Short Stay department on the 2nd floor on Tuesday 07/07/13 at 11:00am. Do not eat or drink anything past midnight the night before.

## 2013-07-01 NOTE — Progress Notes (Signed)
Pt states he is not interested in watching any diabetic education videos.

## 2013-07-06 ENCOUNTER — Other Ambulatory Visit: Payer: Self-pay | Admitting: *Deleted

## 2013-07-06 DIAGNOSIS — Z01818 Encounter for other preprocedural examination: Secondary | ICD-10-CM

## 2013-07-07 ENCOUNTER — Encounter (HOSPITAL_COMMUNITY)
Admission: RE | Disposition: A | Discharge status: Home / Self Care | Payer: Self-pay | Source: Ambulatory Visit | Attending: Cardiovascular Disease

## 2013-07-07 ENCOUNTER — Ambulatory Visit (HOSPITAL_COMMUNITY)
Admission: RE | Admit: 2013-07-07 | Discharge: 2013-07-08 | Disposition: A | Discharge status: Home / Self Care | Payer: Managed Care, Other (non HMO) | Source: Ambulatory Visit | Attending: Cardiovascular Disease | Admitting: Cardiovascular Disease

## 2013-07-07 ENCOUNTER — Encounter (HOSPITAL_COMMUNITY): Payer: Self-pay | Admitting: General Practice

## 2013-07-07 DIAGNOSIS — E1151 Type 2 diabetes mellitus with diabetic peripheral angiopathy without gangrene: Secondary | ICD-10-CM

## 2013-07-07 DIAGNOSIS — I739 Peripheral vascular disease, unspecified: Secondary | ICD-10-CM

## 2013-07-07 DIAGNOSIS — I70219 Atherosclerosis of native arteries of extremities with intermittent claudication, unspecified extremity: Secondary | ICD-10-CM

## 2013-07-07 DIAGNOSIS — Z01818 Encounter for other preprocedural examination: Secondary | ICD-10-CM

## 2013-07-07 DIAGNOSIS — F17200 Nicotine dependence, unspecified, uncomplicated: Secondary | ICD-10-CM | POA: Insufficient documentation

## 2013-07-07 DIAGNOSIS — E785 Hyperlipidemia, unspecified: Secondary | ICD-10-CM

## 2013-07-07 DIAGNOSIS — Z720 Tobacco use: Secondary | ICD-10-CM | POA: Diagnosis present

## 2013-07-07 DIAGNOSIS — Z951 Presence of aortocoronary bypass graft: Secondary | ICD-10-CM | POA: Diagnosis present

## 2013-07-07 DIAGNOSIS — IMO0002 Reserved for concepts with insufficient information to code with codable children: Secondary | ICD-10-CM

## 2013-07-07 DIAGNOSIS — E1159 Type 2 diabetes mellitus with other circulatory complications: Secondary | ICD-10-CM | POA: Diagnosis present

## 2013-07-07 DIAGNOSIS — I10 Essential (primary) hypertension: Secondary | ICD-10-CM

## 2013-07-07 DIAGNOSIS — Z79899 Other long term (current) drug therapy: Secondary | ICD-10-CM | POA: Insufficient documentation

## 2013-07-07 DIAGNOSIS — I2510 Atherosclerotic heart disease of native coronary artery without angina pectoris: Secondary | ICD-10-CM

## 2013-07-07 HISTORY — PX: LOWER EXTREMITY ANGIOGRAM: SHX5508

## 2013-07-07 LAB — GLUCOSE, CAPILLARY: Glucose-Capillary: 253 mg/dL — ABNORMAL HIGH (ref 70–99)

## 2013-07-07 LAB — POCT ACTIVATED CLOTTING TIME
Activated Clotting Time: 196 seconds
Activated Clotting Time: 186 seconds

## 2013-07-07 SURGERY — ANGIOGRAM, LOWER EXTREMITY
Anesthesia: LOCAL

## 2013-07-07 MED ORDER — CLOPIDOGREL BISULFATE 75 MG PO TABS: 75.0000 mg | ORAL_TABLET | Freq: Every day | ORAL | Status: DC

## 2013-07-07 MED ORDER — GLIPIZIDE ER 10 MG PO TB24
10.0000 mg | ORAL_TABLET | Freq: Two times a day (BID) | ORAL | Status: DC
  Administered 2013-07-07 – 2013-07-08 (×2): 10 mg via ORAL
  Filled 2013-07-07 (×3): qty 1

## 2013-07-07 MED ORDER — PIOGLITAZONE HCL 15 MG PO TABS
15.0000 mg | ORAL_TABLET | Freq: Two times a day (BID) | ORAL | Status: DC
  Administered 2013-07-07 – 2013-07-08 (×2): 15 mg via ORAL
  Filled 2013-07-07 (×4): qty 1

## 2013-07-07 MED ORDER — HEPARIN SODIUM (PORCINE) 1000 UNIT/ML IJ SOLN
INTRAMUSCULAR | Status: AC
  Filled 2013-07-07: qty 1

## 2013-07-07 MED ORDER — PANTOPRAZOLE SODIUM 40 MG PO TBEC
40.0000 mg | DELAYED_RELEASE_TABLET | Freq: Every day | ORAL | Status: DC
  Administered 2013-07-08: 40 mg via ORAL
  Filled 2013-07-07: qty 1

## 2013-07-07 MED ORDER — HEPARIN (PORCINE) IN NACL 2-0.9 UNIT/ML-% IJ SOLN
INTRAMUSCULAR | Status: AC
  Filled 2013-07-07: qty 1000

## 2013-07-07 MED ORDER — MIDAZOLAM HCL 2 MG/2ML IJ SOLN
INTRAMUSCULAR | Status: AC
  Filled 2013-07-07: qty 2

## 2013-07-07 MED ORDER — ONDANSETRON HCL 4 MG/2ML IJ SOLN: 4.0000 mg | Freq: Four times a day (QID) | INTRAMUSCULAR | Status: DC | PRN

## 2013-07-07 MED ORDER — DIAZEPAM 5 MG PO TABS
5.0000 mg | ORAL_TABLET | ORAL | Status: AC
  Administered 2013-07-07: 5 mg via ORAL
  Filled 2013-07-07: qty 1

## 2013-07-07 MED ORDER — FENTANYL CITRATE 0.05 MG/ML IJ SOLN
INTRAMUSCULAR | Status: AC
  Filled 2013-07-07: qty 2

## 2013-07-07 MED ORDER — ASPIRIN EC 81 MG PO TBEC
81.0000 mg | DELAYED_RELEASE_TABLET | Freq: Every day | ORAL | Status: DC
  Administered 2013-07-08: 81 mg via ORAL
  Filled 2013-07-07: qty 1

## 2013-07-07 MED ORDER — HYDRALAZINE HCL 20 MG/ML IJ SOLN
10.0000 mg | INTRAMUSCULAR | Status: DC | PRN
  Administered 2013-07-07: 18:00:00 10 mg via INTRAVENOUS
  Filled 2013-07-07: qty 1

## 2013-07-07 MED ORDER — LORATADINE 10 MG PO TABS
10.0000 mg | ORAL_TABLET | Freq: Every day | ORAL | Status: DC
  Administered 2013-07-08: 10 mg via ORAL
  Filled 2013-07-07: qty 1

## 2013-07-07 MED ORDER — METOPROLOL TARTRATE 50 MG PO TABS
50.0000 mg | ORAL_TABLET | Freq: Two times a day (BID) | ORAL | Status: DC
  Filled 2013-07-07: qty 1

## 2013-07-07 MED ORDER — CILOSTAZOL 100 MG PO TABS
100.0000 mg | ORAL_TABLET | Freq: Two times a day (BID) | ORAL | Status: DC
  Administered 2013-07-07 – 2013-07-08 (×2): 100 mg via ORAL
  Filled 2013-07-07 (×3): qty 1

## 2013-07-07 MED ORDER — SODIUM CHLORIDE 0.9 % IV SOLN
INTRAVENOUS | Status: DC
  Administered 2013-07-07: 12:00:00 via INTRAVENOUS

## 2013-07-07 MED ORDER — LISINOPRIL 40 MG PO TABS
40.0000 mg | ORAL_TABLET | Freq: Every day | ORAL | Status: DC
  Administered 2013-07-08: 40 mg via ORAL
  Filled 2013-07-07: qty 1

## 2013-07-07 MED ORDER — ASPIRIN EC 325 MG PO TBEC
325.0000 mg | DELAYED_RELEASE_TABLET | Freq: Every day | ORAL | Status: DC
  Filled 2013-07-07: qty 1

## 2013-07-07 MED ORDER — NIFEDIPINE ER 60 MG PO TB24
60.0000 mg | ORAL_TABLET | Freq: Every day | ORAL | Status: DC
  Filled 2013-07-07: qty 1

## 2013-07-07 MED ORDER — NITROGLYCERIN 0.4 MG SL SUBL: 0.4000 mg | SUBLINGUAL_TABLET | SUBLINGUAL | Status: DC | PRN

## 2013-07-07 MED ORDER — SODIUM CHLORIDE 0.9 % IV SOLN
INTRAVENOUS | Status: AC
  Administered 2013-07-07: 16:00:00 via INTRAVENOUS

## 2013-07-07 MED ORDER — ACETAMINOPHEN 325 MG PO TABS: 650.0000 mg | ORAL_TABLET | ORAL | Status: DC | PRN

## 2013-07-07 MED ORDER — LIDOCAINE HCL (PF) 1 % IJ SOLN
INTRAMUSCULAR | Status: AC
  Filled 2013-07-07: qty 30

## 2013-07-07 MED ORDER — INSULIN ASPART 100 UNIT/ML ~~LOC~~ SOLN
0.0000 [IU] | Freq: Three times a day (TID) | SUBCUTANEOUS | Status: DC
  Administered 2013-07-07 – 2013-07-08 (×2): 5 [IU] via SUBCUTANEOUS

## 2013-07-07 MED ORDER — SODIUM CHLORIDE 0.9 % IJ SOLN: 3.0000 mL | INTRAMUSCULAR | Status: DC | PRN

## 2013-07-07 MED ORDER — CLOPIDOGREL BISULFATE 75 MG PO TABS
75.0000 mg | ORAL_TABLET | Freq: Every day | ORAL | Status: DC
  Administered 2013-07-08: 11:00:00 75 mg via ORAL
  Filled 2013-07-07: qty 1

## 2013-07-07 MED ORDER — INSULIN ASPART 100 UNIT/ML ~~LOC~~ SOLN: 0.0000 [IU] | Freq: Three times a day (TID) | SUBCUTANEOUS | Status: DC

## 2013-07-07 MED ORDER — MORPHINE SULFATE 2 MG/ML IJ SOLN: 2.0000 mg | INTRAMUSCULAR | Status: DC | PRN

## 2013-07-07 MED ORDER — ASPIRIN 81 MG PO CHEW
81.0000 mg | CHEWABLE_TABLET | ORAL | Status: AC
  Administered 2013-07-07: 81 mg via ORAL
  Filled 2013-07-07: qty 1

## 2013-07-07 MED ORDER — METOPROLOL TARTRATE 50 MG PO TABS
50.0000 mg | ORAL_TABLET | Freq: Two times a day (BID) | ORAL | Status: DC
  Administered 2013-07-07 – 2013-07-08 (×2): 50 mg via ORAL
  Filled 2013-07-07 (×3): qty 1

## 2013-07-07 NOTE — CV Procedure (Signed)
Dennis [name] is a 67 y.o. male    [id number] LOCATION:  FACILITY: [facility]  PHYSICIAN: Nanetta Batty, M.D. 29-Apr-1946   DATE OF PROCEDURE:  07/07/2013  DATE OF DISCHARGE:     PV Angiogram/Intervention    History obtained from chart review.[name] is a 66 year old married Caucasian male father of one, grandmother, grandfather is accompanied by his wife today. He was referred by [facility] at [facility] for evaluation of claudication and arterial Doppler studies which were obtained in our office 04/07/13. His cardiovascular risk factors include type 2 diabetes, hypertension, and hyperlipidemia. He has a 50-100-pack-year history of tobacco abuse currently smoking one pack to 2 packs a day. There is no family history of heart disease. He's never had a heart attack or stroke. He does complain of dyspnea on exertion. He has had claudication for the last 2 years worse over the last 3 months which is now lifestyle limiting. Doppler studies in our office performed 04/07/13 revealed a right ABI of 0.63 and a left ABI of 0.70. He had high-grade SFA disease bilaterally as well as tibial disease.because of a positive Myoview stress test the patient first had a diagnostic coronary arteriogram. Which showed 3 vessel disease with moderate LV dysfunction. He'll underwent coronary bypass grafting x4 by Dr. [name] on 05/12/13 with a sequential LIMA to the LAD and diagonal branch, vein to obtuse marginal branch and to the PDA. His postop course was uncomplicated. He still has lifestyle limiting claudication right greater than left and wishes to have his right lower extremity revascularized. Angiography did reveal high-grade bilateral SFA disease as well as tibial vessel disease. He underwent Turbo Hawk directional atherectomy of his right SFA several weeks ago. He had an excellent angiographic and clinical result. He presents today for staged left SFA Turbo hawk directional atherectomy were lifestyle limiting  claudication.   PROCEDURE DESCRIPTION:   The patient was brought to the second floor [facility] cath lab in the postabsorptive state. He was premedicated with Valium 5 mg by mouth, IV Versed and fentanyl. His right groinwas prepped and shaved in usual sterile fashion. Xylocaine 1% was used for local anesthesia. A 7  French sheath was inserted into the right common femoral artery using standard Seldinger technique.contralateral access was obtained with a 5 [place] crossover catheter, 35 angled Glidewire, 35 Versicore  wire, and 7 French/55 cm Ansel  Sheath. A 6 mm spider distal protection device was then placed through a 035 with cross endhole catheter that was advanced to the level of the above-the-knee popliteal with the aid of an angled Glidewire. The patient received 11,000 units of heparin intravenously with an ending ACT of 227. A total of 120 cc of contrast was administered to the patient. Using an LXM Directional arthrectomy device multiple cuts were performed over a 200 mm length removing a copious amount of yellow and white atheromatous tissue. B. Distal protection device was then retrieved and completion angiography was performed revealing a widely patent proximal and mid third of the left SFA and this did not appear to require adjunctive balloon angioplasty.   HEMODYNAMICS:    AO SYSTOLIC/AO DIASTOLIC: 158/92    IMPRESSION:successful TurboHawk  directional arthrectomy using distal protection of highly diseased proximal and mid third of the left SFA with one-vessel runoff or lifestyle limiting claudication. The patient tolerated the procedure well. The sheath was then withdrawn across the bifurcation. There was immediate amount of oozing around the 7 French sheath and therefore an upgrade was performed with  a short 8 [place] sheath. This appeared to remove the sheath and hold pressure Association he falls below 170. The patient will be kept recumbent for 6 hours, hydrated overnight and  discharged home in the morning. We will get outpatient lower extremity arterial Doppler studies after which I will see him back in the office.    [name] MD, [facility] 07/07/2013 4:05 PM

## 2013-07-08 ENCOUNTER — Other Ambulatory Visit: Payer: Self-pay | Admitting: Cardiology

## 2013-07-08 DIAGNOSIS — I10 Essential (primary) hypertension: Secondary | ICD-10-CM

## 2013-07-08 DIAGNOSIS — Z959 Presence of cardiac and vascular implant and graft, unspecified: Secondary | ICD-10-CM

## 2013-07-08 DIAGNOSIS — E785 Hyperlipidemia, unspecified: Secondary | ICD-10-CM

## 2013-07-08 DIAGNOSIS — I739 Peripheral vascular disease, unspecified: Secondary | ICD-10-CM

## 2013-07-08 DIAGNOSIS — I2510 Atherosclerotic heart disease of native coronary artery without angina pectoris: Secondary | ICD-10-CM

## 2013-07-08 DIAGNOSIS — E1159 Type 2 diabetes mellitus with other circulatory complications: Secondary | ICD-10-CM

## 2013-07-08 LAB — CBC
HCT: 31.1 % — ABNORMAL LOW (ref 39.0–52.0)
Hemoglobin: 10.4 g/dL — ABNORMAL LOW (ref 13.0–17.0)
MCH: 27.9 pg (ref 26.0–34.0)
MCHC: 33.4 g/dL (ref 30.0–36.0)
MCV: 83.4 fL (ref 78.0–100.0)
RBC: 3.73 MIL/uL — ABNORMAL LOW (ref 4.22–5.81)

## 2013-07-08 LAB — BASIC METABOLIC PANEL
BUN: 9 mg/dL (ref 6–23)
CO2: 22 mEq/L (ref 19–32)
Chloride: 103 mEq/L (ref 96–112)
Creatinine, Ser: 0.83 mg/dL (ref 0.50–1.35)
GFR calc Af Amer: >90 mL/min (ref >90)
Glucose, Bld: 344 mg/dL — ABNORMAL HIGH (ref 70–99)
Potassium: 3.6 mEq/L (ref 3.5–5.1)
Sodium: 137 mEq/L (ref 135–145)

## 2013-07-08 LAB — GLUCOSE, CAPILLARY
Glucose-Capillary: 295 mg/dL — ABNORMAL HIGH (ref 70–99)
Glucose-Capillary: 392 mg/dL — ABNORMAL HIGH (ref 70–99)

## 2013-07-08 MED ORDER — METOPROLOL TARTRATE 1 MG/ML IV SOLN
5.0000 mg | Freq: Once | INTRAVENOUS | Status: AC
  Administered 2013-07-08: 11:00:00 5 mg via INTRAVENOUS

## 2013-07-08 MED ORDER — METOPROLOL TARTRATE 1 MG/ML IV SOLN
INTRAVENOUS | Status: AC
  Filled 2013-07-08: qty 5

## 2013-07-08 MED ORDER — INSULIN ASPART 100 UNIT/ML ~~LOC~~ SOLN
5.0000 [IU] | Freq: Once | SUBCUTANEOUS | Status: AC
  Administered 2013-07-08: 5 [IU] via SUBCUTANEOUS

## 2013-07-08 NOTE — Progress Notes (Signed)
Pt has had claudication for the last 2 years worse over the last 3 months which is now lifestyle limiting. Doppler studies in our office performed 04/07/13 revealed a right ABI of 0.63 and a left ABI of 0.70. He had high-grade SFA disease bilaterally as well as tibial disease.because of a positive Myoview stress test the patient first had a diagnostic coronary arteriogram. Which showed 3 vessel disease with moderate LV dysfunction. He underwent coronary bypass grafting x4 by Dr. [name] on 05/12/13 with a sequential LIMA to the LAD and diagonal branch, vein to obtuse marginal branch and to the PDA. His postop course was uncomplicated. He still has lifestyle limiting claudication right greater than left and wishes to have his right lower extremity revascularized. Angiography did reveal high-grade bilateral SFA disease as well as tibial vessel disease. He underwent Turbo Hawk directional atherectomy of his right SFA several weeks ago. He had an excellent angiographic and clinical result. He presented yesterday for staged left SFA Turbo hawk directional atherectomy were lifestyle limiting claudication.  Successful TurboHawk directional arthrectomy using distal protection of highly diseased proximal and mid third of the left SFA with one-vessel runoff   Subjective: No complaints, unaware of increased HR and PVCs.  Objective: Vital signs in last 24 hours: Temp:  [97.7 F (36.5 C)-98.4 F (36.9 C)] 98.2 F (36.8 C) (10/22 0810) Pulse Rate:  [99-117] 113 (10/22 0810) Resp:  [15-20] 20 (10/22 0810) BP: (127-201)/(44-114) 145/96 mmHg (10/22 0810) SpO2:  [18 %-99 %] 98 % (10/22 0810) Weight:  [98 lb (44.453 kg)-199 lb 8.3 oz (90.5 kg)] 199 lb 8.3 oz (90.5 kg) (10/22 0022) Weight change:  Last BM Date: 07/07/13 Intake/Output from previous day: 10/21 0701 - 10/22 0700 In: 560 [P.O.:360; I.V.:200] Out: 350 [Urine:350] Intake/Output this shift:    PE: General:Pleasant affect,  NAD Skin:Warm and dry, brisk capillary refill HEENT:normocephalic, sclera clear, mucus membranes moist Heart:S1S2 RRR without murmur, gallup, rub or click Lungs:clear without rales, rhonchi, or wheezes QIO:NGEX, non tender, + BS, do not palpate liver spleen or masses Ext:no lower ext edema, Rt groin site with small knot-  Neuro:alert and oriented, MAE, follows commands, + facial symmetry   Lab Results:  Recent Labs  07/08/13 0327  WBC 6.3  HGB 10.4*  HCT 31.1*  PLT 278   BMET  Recent Labs  07/08/13 0327  NA 137  K 3.6  CL 103  CO2 22  GLUCOSE 344*  BUN 9  CREATININE 0.83  CALCIUM 9.0   No results found for this basename: TROPONINI, CK, MB,  in the last 72 hours  No results found for this basename: CHOL,  HDL,  LDLCALC,  LDLDIRECT,  TRIG,  CHOLHDL   Lab Results  Component Value Date   HGBA1C 12.4* 05/08/2013     Lab Results  Component Value Date   TSH 3.615 06/23/2013      EKG: Orders placed in visit on 06/26/13  . EKG 12-LEAD    Studies/Results: successful TurboHawk directional arthrectomy using distal protection of highly diseased proximal and mid third of the left SFA with one-vessel runoff    Medications: I have reviewed the patient's current medications. Scheduled Meds: . aspirin EC  325 mg Oral Daily  . aspirin EC  81 mg Oral Daily  . cilostazol  100 mg Oral BID  . clopidogrel  75 mg Oral Daily  . glipiZIDE  10 mg Oral BID  . insulin aspart  0-9 Units Subcutaneous TID WC  .  lisinopril  40 mg Oral Daily  . loratadine  10 mg Oral Daily  . metoprolol tartrate  50 mg Oral BID  . pantoprazole  40 mg Oral Daily  . pioglitazone  15 mg Oral BID PC   Continuous Infusions:  PRN Meds:.acetaminophen, hydrALAZINE, morphine injection, nitroGLYCERIN, ondansetron (ZOFRAN) IV  Assessment/Plan: Principal Problem:   Claudication Active Problems:   Uncontrolled diabetes mellitus type 2 with peripheral artery disease- HGB A1c 12 on admission   Tobacco  abuse   CAD- s/p CABG X 4 05/12/13   PVD - bilat SFA disease - s/p PCTA of right SFA 06/30/13; staged PCI to prox & mid third of Lt SFA Turbohawk arthrectomy 07/07/13  PLAN: ambulate and discharge home.  Outpatient lower ext dopplers- Bil.-previous Rt SFA with turbo hawked directional atherectomy done 06/30/13.   Glucose remains elevated, metformin on hold.  Will need to see PCP as outpt.  Is using E cigs. to stop smoking and is decreasing the nicotine. HR elevated and freq PVCs.   LOS: 1 day    [facility] R  Nurse Practitioner Certified Pager [phone] 07/08/2013, 9:11 AM   Pt. Seen & examined today.  Doing well s/p TurboHawk Atherectomy of L SFA.  Ambulated without difficulty.  BP & HR up this AM - given AM meds + IV BB to make up for missed doses yesterday.    Groin stable.  Ready for d/c.  Marykay Lex, MD

## 2013-07-08 NOTE — Discharge Summary (Signed)
Physician Discharge Summary       Patient ID: Dennis [name] MRN: [id number] DOB/AGE: 04/03/1946 67 y.o.  Admit date: 07/07/2013 Discharge date: 07/08/2013  Discharge Diagnoses:  Principal Problem:   Claudication Active Problems:   PVD - bilat SFA disease - s/p PCTA of right SFA 06/30/13; staged PCI to prox & mid third of Lt SFA Turbohawk arthrectomy 07/07/13   Uncontrolled diabetes mellitus type 2 with peripheral artery disease- HGB A1c 12 on admission   Tobacco abuse   CAD- s/p CABG X 4 05/12/13   Discharged Condition: good  Procedures: PV angio and 2nd planned intervention with successful TurboHawk directional arthrectomy using distal protection of highly diseased proximal and mid third of the left SFA with one-vessel runoff 07/07/13 by Dr. [name].    Hospital Course: 67 yr old WM has had claudication for the last 2 years worse over the last 3 months which is now lifestyle limiting. Doppler studies in our office performed 04/07/13 revealed a right ABI of 0.63 and a left ABI of 0.70. He had high-grade SFA disease bilaterally as well as tibial disease.  Because of a positive Myoview stress test the patient first had a diagnostic coronary arteriogram. Which showed 3 vessel disease with moderate LV dysfunction. He underwent coronary bypass grafting x4 by Dr. [name] on 05/12/13 with a sequential LIMA to the LAD and diagonal branch, vein to obtuse marginal branch and to the PDA. His postop course was uncomplicated. He still has lifestyle limiting claudication right greater than left and wished to have his right lower extremity revascularized. Angiography did reveal high-grade bilateral SFA disease as well as tibial vessel disease. He underwent Turbo Hawk directional atherectomy of his right SFA several weeks ago. He had an excellent angiographic and clinical result. He presented back 07/07/13 for staged left SFA Turbo hawk directional atherectomy with lifestyle limiting claudication.  Pt had  successful TurboHawk directional arthrectomy using distal protection of highly diseased proximal and mid third of the left SFA with one-vessel runoff.  He tolerated procedure without complications.    Since procedure HR has been 117 ST and he has had freq PVCs.  This am we gave 5 mg IV lopressor in addition to his po lopressor.  His BP was also elevated.  By discharge BP improved to 139/91.  He ambulated without complications.  He was seen and found to be stable by Dr. Herbie Baltimore and discharged home.  He will follow up with post procedure dopplers and then with Dr. [name].  Please note gluclose is elevated and his wife stated it is always up.  He will see PCP in a week for further management.   Consults: None  Significant Diagnostic Studies:  BMET    Component Value Date/Time   NA 137 07/08/2013 0327   K 3.6 07/08/2013 0327   CL 103 07/08/2013 0327   CO2 22 07/08/2013 0327   GLUCOSE 344* 07/08/2013 0327   BUN 9 07/08/2013 0327   CREATININE 0.83 07/08/2013 0327   CREATININE 0.88 06/23/2013 1012   CALCIUM 9.0 07/08/2013 0327   GFRNONAA 88* 07/08/2013 0327   GFRAA >90 07/08/2013 0327    CBC    Component Value Date/Time   WBC 6.3 07/08/2013 0327   RBC 3.73* 07/08/2013 0327   HGB 10.4* 07/08/2013 0327   HCT 31.1* 07/08/2013 0327   PLT 278 07/08/2013 0327   MCV 83.4 07/08/2013 0327   MCH 27.9 07/08/2013 0327   MCHC 33.4 07/08/2013 0327   RDW 13.0 07/08/2013 0327  Discharge Exam: Blood pressure 139/91, pulse 113, temperature 98.2 F (36.8 C), temperature source Oral, resp. rate 20, height 5\' 9"  (1.753 m), weight 199 lb 8.3 oz (90.5 kg), SpO2 98.00%.   AM exam: PE: General:Pleasant affect, NAD  Skin:Warm and dry, brisk capillary refill  HEENT:normocephalic, sclera clear, mucus membranes moist  Heart:S1S2 RRR without murmur, gallup, rub or click  Lungs:clear without rales, rhonchi, or wheezes  ZOX:WRUE, non tender, + BS, do not palpate liver spleen or masses  Ext:no lower ext  edema, Rt groin site with small knot-  Neuro:alert and oriented, MAE, follows commands, + facial symmetry   Disposition: 01-Home or Self Care     Medication List         aspirin EC 81 MG tablet  Take 1 tablet (81 mg total) by mouth daily.     cetirizine 10 MG tablet  Commonly known as:  ZYRTEC  Take 10 mg by mouth daily.     cilostazol 100 MG tablet  Commonly known as:  PLETAL  Take 100 mg by mouth 2 (two) times daily.     clopidogrel 75 MG tablet  Commonly known as:  PLAVIX  Take 1 tablet (75 mg total) by mouth daily.     glipiZIDE 10 MG 24 hr tablet  Commonly known as:  GLUCOTROL XL  Take 10 mg by mouth 2 (two) times daily.     lansoprazole 30 MG capsule  Commonly known as:  PREVACID  Take 30 mg by mouth daily.     lisinopril 40 MG tablet  Commonly known as:  PRINIVIL,ZESTRIL  Take 40 mg by mouth daily.     metFORMIN 1000 MG tablet  Commonly known as:  GLUCOPHAGE  Take 1 tablet (1,000 mg total) by mouth 2 (two) times daily with a meal.     metoprolol 50 MG tablet  Commonly known as:  LOPRESSOR  Take 1 tablet (50 mg total) by mouth 2 (two) times daily.     NIFEDICAL XL 60 MG 24 hr tablet  Generic drug:  NIFEdipine  Take 60 mg by mouth daily.     NITROSTAT 0.4 MG SL tablet  Generic drug:  nitroGLYCERIN  Place 0.4 mg under the tongue every 5 (five) minutes as needed for chest pain.     pioglitazone 15 MG tablet  Commonly known as:  ACTOS  Take 1 tablet (15 mg total) by mouth 2 (two) times daily after a meal.     pravastatin 40 MG tablet  Commonly known as:  PRAVACHOL  Take 40 mg by mouth daily.       Follow-up Information   Follow up with [name], MD. (our office will call you with date and time for dopplers of both legs, and follow appt with Dr. [name])    Specialty:  Cardiology   Contact information:   [address] [place] [address] [phone]        Discharge Instructions: Heart Healthy diabetic diet.  See  your primary care MD for further diabetes management.  Hold Metformin until the 24th may resume on the 24th.  We hold it to prevent interaction between cath dye and metformin. No driving for 3 days, no lifting over 5 pounds for 3 days.  Call [facility]  [phone]  if any bleeding, swelling or drainage at cath site.  May shower, no tub baths for 48 hours for groin sticks.   Signed: Leone Brand Nurse Practitioner-Certified [facility]:  [facility] 07/08/2013, 5:46 PM  Time spent on discharge :40 minutes.   Agree with note written by Nada Boozer RNP [name] 07/09/2013 3:49 PM

## 2013-07-08 NOTE — Progress Notes (Signed)
Inpatient Diabetes Program Recommendations  AACE/ADA: New Consensus Statement on Inpatient Glycemic Control (2013)  Target Ranges:  Prepandial:   less than 140 mg/dL      Peak postprandial:   less than 180 mg/dL (1-2 hours)      Critically ill patients:  140 - 180 mg/dL  Results for RASMUS, PREUSSER (MRN [id number]) as of 07/08/2013 10:54  Ref. Range 07/07/2013 11:38 07/07/2013 16:11 07/07/2013 18:30 07/08/2013 00:59 07/08/2013 08:13  Glucose-Capillary Latest Range: 70-99 mg/dL 409 (H) 811 (H) 914 (H) 392 (H) 295 (H)   Inpatient Diabetes Program Recommendations Insulin - Basal: recommend basal insulin Lantus or Levemir  HgbA1C: =12.4 05/08/13 Thank you  Piedad Climes BSN, RN,CDE Inpatient Diabetes Coordinator [phone] (team pager)

## 2013-07-09 ENCOUNTER — Encounter (HOSPITAL_COMMUNITY): Payer: Self-pay | Admitting: *Deleted

## 2013-07-15 ENCOUNTER — Other Ambulatory Visit: Payer: Self-pay | Admitting: Physician Assistant

## 2013-07-16 ENCOUNTER — Encounter: Payer: Self-pay | Admitting: Cardiovascular Disease

## 2013-08-02 ENCOUNTER — Emergency Department (HOSPITAL_COMMUNITY): Payer: Managed Care, Other (non HMO)

## 2013-08-02 ENCOUNTER — Encounter (HOSPITAL_COMMUNITY): Payer: Self-pay | Admitting: Emergency Medicine

## 2013-08-02 ENCOUNTER — Emergency Department (HOSPITAL_COMMUNITY)
Admission: EM | Admit: 2013-08-02 | Discharge: 2013-08-02 | Disposition: A | Discharge status: Home / Self Care | Payer: Managed Care, Other (non HMO) | Attending: Emergency Medicine | Admitting: Emergency Medicine

## 2013-08-02 DIAGNOSIS — I2510 Atherosclerotic heart disease of native coronary artery without angina pectoris: Secondary | ICD-10-CM | POA: Insufficient documentation

## 2013-08-02 DIAGNOSIS — Z888 Allergy status to other drugs, medicaments and biological substances status: Secondary | ICD-10-CM | POA: Insufficient documentation

## 2013-08-02 DIAGNOSIS — K219 Gastro-esophageal reflux disease without esophagitis: Secondary | ICD-10-CM | POA: Insufficient documentation

## 2013-08-02 DIAGNOSIS — R0682 Tachypnea, not elsewhere classified: Secondary | ICD-10-CM | POA: Insufficient documentation

## 2013-08-02 DIAGNOSIS — Z7982 Long term (current) use of aspirin: Secondary | ICD-10-CM | POA: Insufficient documentation

## 2013-08-02 DIAGNOSIS — M129 Arthropathy, unspecified: Secondary | ICD-10-CM | POA: Insufficient documentation

## 2013-08-02 DIAGNOSIS — Z87891 Personal history of nicotine dependence: Secondary | ICD-10-CM | POA: Insufficient documentation

## 2013-08-02 DIAGNOSIS — R05 Cough: Secondary | ICD-10-CM | POA: Insufficient documentation

## 2013-08-02 DIAGNOSIS — R059 Cough, unspecified: Secondary | ICD-10-CM | POA: Insufficient documentation

## 2013-08-02 DIAGNOSIS — I509 Heart failure, unspecified: Secondary | ICD-10-CM | POA: Insufficient documentation

## 2013-08-02 DIAGNOSIS — R609 Edema, unspecified: Secondary | ICD-10-CM | POA: Insufficient documentation

## 2013-08-02 DIAGNOSIS — I10 Essential (primary) hypertension: Secondary | ICD-10-CM | POA: Insufficient documentation

## 2013-08-02 DIAGNOSIS — I739 Peripheral vascular disease, unspecified: Secondary | ICD-10-CM | POA: Insufficient documentation

## 2013-08-02 DIAGNOSIS — Z79899 Other long term (current) drug therapy: Secondary | ICD-10-CM | POA: Insufficient documentation

## 2013-08-02 DIAGNOSIS — R000 Tachycardia, unspecified: Secondary | ICD-10-CM | POA: Insufficient documentation

## 2013-08-02 DIAGNOSIS — E119 Type 2 diabetes mellitus without complications: Secondary | ICD-10-CM | POA: Insufficient documentation

## 2013-08-02 DIAGNOSIS — J810 Acute pulmonary edema: Secondary | ICD-10-CM | POA: Insufficient documentation

## 2013-08-02 DIAGNOSIS — Z951 Presence of aortocoronary bypass graft: Secondary | ICD-10-CM | POA: Insufficient documentation

## 2013-08-02 DIAGNOSIS — E785 Hyperlipidemia, unspecified: Secondary | ICD-10-CM | POA: Insufficient documentation

## 2013-08-02 LAB — COMPREHENSIVE METABOLIC PANEL
ALT: 8 U/L (ref 0–53)
Albumin: 3.5 g/dL (ref 3.5–5.2)
Alkaline Phosphatase: 70 U/L (ref 39–117)
BUN: 14 mg/dL (ref 6–23)
Chloride: 100 mEq/L (ref 96–112)
GFR calc Af Amer: >90 mL/min (ref >90)
Glucose, Bld: 271 mg/dL — ABNORMAL HIGH (ref 70–99)
Potassium: 4.4 mEq/L (ref 3.5–5.1)
Sodium: 134 mEq/L — ABNORMAL LOW (ref 135–145)
Total Bilirubin: 0.3 mg/dL (ref 0.3–1.2)
Total Protein: 7.5 g/dL (ref 6.0–8.3)

## 2013-08-02 LAB — CBC WITH DIFFERENTIAL/PLATELET
Basophils Relative: 0 % (ref 0–1)
Eosinophils Absolute: 0.2 10*3/uL (ref 0.0–0.7)
HCT: 36.5 % — ABNORMAL LOW (ref 39.0–52.0)
Hemoglobin: 12.3 g/dL — ABNORMAL LOW (ref 13.0–17.0)
Lymphocytes Relative: 21 % (ref 12–46)
Lymphs Abs: 1.5 10*3/uL (ref 0.7–4.0)
MCH: 27.3 pg (ref 26.0–34.0)
MCHC: 33.7 g/dL (ref 30.0–36.0)
MCV: 80.9 fL (ref 78.0–100.0)
Monocytes Absolute: 0.7 10*3/uL (ref 0.1–1.0)
Monocytes Relative: 10 % (ref 3–12)
Neutro Abs: 4.7 10*3/uL (ref 1.7–7.7)
Neutrophils Relative %: 66 % (ref 43–77)
Platelets: 269 10*3/uL (ref 150–400)
RBC: 4.51 MIL/uL (ref 4.22–5.81)
WBC: 7.1 10*3/uL (ref 4.0–10.5)

## 2013-08-02 MED ORDER — FUROSEMIDE 20 MG PO TABS
20.0000 mg | ORAL_TABLET | Freq: Once | ORAL | Status: AC
  Administered 2013-08-02: 20 mg via ORAL
  Filled 2013-08-02: qty 1

## 2013-08-02 MED ORDER — FUROSEMIDE 20 MG PO TABS: 20.0000 mg | ORAL_TABLET | Freq: Every day | ORAL | Status: DC

## 2013-08-02 NOTE — ED Notes (Signed)
Patient transported to X-ray 
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
## 2013-08-02 NOTE — ED Notes (Signed)
Patient returned from X-ray 

## 2013-08-02 NOTE — ED Notes (Signed)
Woke up about 0500 this morning and could not breathe, increased SOB with exertion. Went to [facility] in [place]. Given 324 ASA there. No chest pain at all today. Comfortable at rest, denies sob or pain at this time.

## 2013-08-03 ENCOUNTER — Ambulatory Visit (HOSPITAL_COMMUNITY)
Admission: RE | Admit: 2013-08-03 | Discharge: 2013-08-03 | Disposition: A | Discharge status: Home / Self Care | Payer: Managed Care, Other (non HMO) | Source: Ambulatory Visit | Attending: Cardiology | Admitting: Cardiology

## 2013-08-03 DIAGNOSIS — I70219 Atherosclerosis of native arteries of extremities with intermittent claudication, unspecified extremity: Secondary | ICD-10-CM

## 2013-08-03 DIAGNOSIS — Z959 Presence of cardiac and vascular implant and graft, unspecified: Secondary | ICD-10-CM

## 2013-08-03 DIAGNOSIS — I739 Peripheral vascular disease, unspecified: Secondary | ICD-10-CM

## 2013-08-03 DIAGNOSIS — Z9889 Other specified postprocedural states: Secondary | ICD-10-CM | POA: Insufficient documentation

## 2013-08-03 DIAGNOSIS — Z48812 Encounter for surgical aftercare following surgery on the circulatory system: Secondary | ICD-10-CM

## 2013-08-03 NOTE — Progress Notes (Signed)
Bilateral Lower Ext. Limited Arterial Duplex completed following post intervention. Marilynne Halsted, BS, RDMS, RVT

## 2013-08-05 ENCOUNTER — Encounter: Payer: Self-pay | Admitting: Cardiology

## 2013-08-05 ENCOUNTER — Telehealth: Payer: Self-pay | Admitting: *Deleted

## 2013-08-05 ENCOUNTER — Ambulatory Visit (INDEPENDENT_AMBULATORY_CARE_PROVIDER_SITE_OTHER): Payer: Managed Care, Other (non HMO) | Admitting: Cardiology

## 2013-08-05 ENCOUNTER — Ambulatory Visit (HOSPITAL_COMMUNITY): Payer: Managed Care, Other (non HMO)

## 2013-08-05 VITALS — BP 140/80 | HR 100 | Ht 69.0 in | Wt 199.3 lb

## 2013-08-05 DIAGNOSIS — I2510 Atherosclerotic heart disease of native coronary artery without angina pectoris: Secondary | ICD-10-CM

## 2013-08-05 DIAGNOSIS — R0600 Dyspnea, unspecified: Secondary | ICD-10-CM

## 2013-08-05 DIAGNOSIS — I739 Peripheral vascular disease, unspecified: Secondary | ICD-10-CM

## 2013-08-05 DIAGNOSIS — R0609 Other forms of dyspnea: Secondary | ICD-10-CM

## 2013-08-05 LAB — BASIC METABOLIC PANEL WITH GFR
BUN: 15 mg/dL (ref 6–23)
CO2: 26 mEq/L (ref 19–32)
Chloride: 102 mEq/L (ref 96–112)
Glucose, Bld: 285 mg/dL — ABNORMAL HIGH (ref 70–99)
Potassium: 4.6 mEq/L (ref 3.5–5.3)
Sodium: 136 mEq/L (ref 135–145)

## 2013-08-05 NOTE — Progress Notes (Signed)
08/06/2013 Dennis [name]   10-28-1945  [id number]  Primary Physicia Stanford Scotland, MD Primary Cardiologist: Dr. [name]  HPI:  The patient is a 67 yr old WM who has had lifestyle limitting claudication for the last 2 years. Doppler studies in our office performed 04/07/13 revealed a right ABI of 0.63 and a left ABI of 0.70. He had high-grade SFA disease bilaterally as well as tibial disease. Because of a positive Myoview stress test the patient first had a diagnostic coronary arteriogram, which showed 3 vessel disease with moderate LV dysfunction. He underwent coronary bypass grafting x 4 by Dr. [name] on 05/12/13 with a sequential LIMA to the LAD and diagonal branch, vein to obtuse marginal branch and to the PDA. His postop course was uncomplicated. He continued to have lifestyle limiting claudication, right greater than left and wished to have his right lower extremity revascularized. Angiography did reveal high-grade bilateral SFA disease as well as tibial vessel disease. He underwent Turbo Hawk directional atherectomy of his right SFA by Dr. [name] on 07/01/13. He had an excellent angiographic and clinical result. He presented back on 07/07/13 for staged left SFA intervention. The pt had successful TurboHawk directional arthrectomy using distal protection of highly diseased proximal and mid third of the left SFA with one-vessel runoff. He tolerated procedure without complications.   He presents back for post-intervention follow-up. He reports significant improvement since his interventions. He denies any claudication since that time. His physical activity level has increased as a result. He had f/u bilateral LEAs in the office, performed 11/17, which demonstrated patent SFAs with a mild amount of residual plaque visualized but not hemodynamically significant. When compared to prior study, there is marked improvement in bilateral ABIs. Right: 0.89 Left 0.85. He reports continued compliance with ASA and  Plavix.   The patient also notes being seen in the [facility] ER several days ago for evaluation of SOB. He reports acute onset and noted associated orthopnea/PND. He denied any chest pain at that time. His BNP was elevated at 3K and CXR showed mild vascular congestion. He was apparently discharged directly from the ER and was prescribed a trial of PO Lasix. He was prescribed 15 tablets of 20 mg of Lasix. He has been taking the Lasix as prescribed. He denies any further SOB. No further PND/orthopnea. No chest pain.     Current Outpatient Prescriptions  Medication Sig Dispense Refill  . aspirin EC 81 MG tablet Take 1 tablet (81 mg total) by mouth daily.  90 tablet  3  . cetirizine (ZYRTEC) 10 MG tablet Take 10 mg by mouth daily.      . cilostazol (PLETAL) 100 MG tablet Take 100 mg by mouth 2 (two) times daily.       . clopidogrel (PLAVIX) 75 MG tablet Take 1 tablet (75 mg total) by mouth daily.  30 tablet  1  . glipiZIDE (GLUCOTROL XL) 10 MG 24 hr tablet Take 10 mg by mouth 2 (two) times daily.      . lansoprazole (PREVACID) 30 MG capsule Take 30 mg by mouth daily.       Marland Kitchen lisinopril (PRINIVIL,ZESTRIL) 40 MG tablet Take 40 mg by mouth daily.       . metFORMIN (GLUCOPHAGE) 1000 MG tablet Take 1 tablet (1,000 mg total) by mouth 2 (two) times daily with a meal.  60 tablet  1  . NITROSTAT 0.4 MG SL tablet Place 0.4 mg under the tongue every 5 (five) minutes as needed for chest  pain.       . pioglitazone (ACTOS) 15 MG tablet Take 1 tablet (15 mg total) by mouth 2 (two) times daily after a meal.  60 tablet  1  . pravastatin (PRAVACHOL) 40 MG tablet Take 40 mg by mouth daily.       . carvedilol (COREG) 6.25 MG tablet Take 1 tablet (6.25 mg total) by mouth 2 (two) times daily.  60 tablet  6  . furosemide (LASIX) 20 MG tablet Take 1 tablet (20 mg total) by mouth 2 (two) times daily.  60 tablet  6  . NIFEDICAL XL 60 MG 24 hr tablet Take 60 mg by mouth daily.       No current facility-administered medications for  this visit.    Allergies  Allergen Reactions  . Chlorhexidine Rash    History   Social History  . Marital Status: Married    Spouse Name: N/A    Number of Children: N/A  . Years of Education: N/A   Occupational History  . Not on file.   Social History Main Topics  . Smoking status: Former Smoker -- 1.00 packs/day for 55 years    Types: Cigarettes    Quit date: 05/05/2013  . Smokeless tobacco: Never Used     Comment: 07/07/2013 now using E- Cig.  . Alcohol Use: Yes     Comment: 07/07/2013 "I'll have a beer once in a blue moon"  . Drug Use: No  . Sexual Activity: Not Currently   Other Topics Concern  . Not on file   Social History Narrative  . No narrative on file     Review of Systems: General: negative for chills, fever, night sweats or weight changes.  Cardiovascular: negative for chest pain, dyspnea on exertion, edema, orthopnea, palpitations, paroxysmal nocturnal dyspnea or shortness of breath Dermatological: negative for rash Respiratory: negative for cough or wheezing Urologic: negative for hematuria Abdominal: negative for nausea, vomiting, diarrhea, bright red blood per rectum, melena, or hematemesis Neurologic: negative for visual changes, syncope, or dizziness All other systems reviewed and are otherwise negative except as noted above.    Blood pressure 140/80, pulse 100, height 5\' 9"  (1.753 m), weight 199 lb 4.8 oz (90.402 kg).  General appearance: alert, cooperative and no distress Neck: no carotid bruit and no JVD Lungs: clear to auscultation bilaterally Heart: regular rate and rhythm, S1, S2 normal, no murmur, click, rub or gallop Extremities: no LEE Pulses: 2+ and symmetric Skin: warm and dry Neurologic: Grossly normal  EKG NSR, no ischemic abnormalities  ASSESSMENT AND PLAN:   PVD - bilat SFA disease - s/p PCTA of right SFA 06/30/13; staged PCI to prox & mid third of Lt SFA Turbohawk arthrectomy 07/07/13 Bilateral lower extremity  claudication has resolved. Physical activity has increased as a result. F/U bilateral LEAs performed on 08/03/13 demonstrated open and patent bilatearal SFAs, with a mild amount of residual plaque visualized but not hemodynamically significant. When compared to prior study, there is marked improvement in bilateral ABIs. Right: 0.89 Left 0.85. Repeat LEAs recommended in 6 months. Continue on ASA and Plavix.  F/U with Dr. [name] PRN.    CAD- s/p CABG X 4 05/12/13 Stable. No Angina. Continue ASA, Plavix, BB, ACE-I and statin  Dyspnea on exertion Pt was examined in the ED recently for evaluation of SOB. Pt noted resting dyspnea and what sounds like orthopnea. Cardiology was never consulted. W/u in ED revealed an elevated BNP of 3,550. CXR demonstrated increased vascular and interstitial prominence  throughout both lungs, compatible with early edema. He was discharged home directly from the ED was was given a prescription of 20 mg Lasix daily. He was prescribed 15 tablets with no refills. Interestingly, there was no echo performed on him post CABG. I have ordered for patient to have an echo done. If significant systolic function noted, we will keep on current dose of Lasix daily  for fluid control. A low sodium diet was discussed as well as checking daily weights. Since Lasix was recently initiated, I have ordered for the patient to get a BMP to evaluate renal function and electrolytes. Will f/u with results.     PLAN:  Pt presents back after undergoing bilateral PV intervention on both left and right SFAs. He has been doing well and notes significant improvement. No further claudication. F/U LEAs showed patent SFAs, good blood flow and improvement in bilateral ABIs. I have instructed pt to continue ASA and Plavix. He can follow-up with Dr. [name] as needed. I have also ordered patient to have a 2D echo to evaluate systolic function, in light of recent SOB and ?CHF exacerbation. Will notify patient of results and  will provide instruction in regards to continuation of PO Lasix. He was also ordered to get BMP and BNP. Will notify patient of results.    Allayne Butcher, PA-C 08/06/2013 6:16 PM

## 2013-08-05 NOTE — Patient Instructions (Signed)
Continue taking your Lasix daily as directed. Return for cardiac ultrasound (2D echo). Get basic lab work done. Our office will call you with ultrasound result and will notify you if you need to return for an appointment.

## 2013-08-05 NOTE — Telephone Encounter (Signed)
[employer] restrictions form is filled out.  I left a message for patient to call me

## 2013-08-05 NOTE — Assessment & Plan Note (Addendum)
Bilateral lower extremity claudication has resolved. Physical activity has increased as a result. F/U bilateral LEAs performed on 08/03/13 demonstrated open and patent bilatearal SFAs, with a mild amount of residual plaque visualized but not hemodynamically significant. When compared to prior study, there is marked improvement in bilateral ABIs. Right: 0.89 Left 0.85. Repeat LEAs recommended in 6 months. Continue on ASA and Plavix.  F/U with Dr. [name] PRN.

## 2013-08-06 ENCOUNTER — Ambulatory Visit (INDEPENDENT_AMBULATORY_CARE_PROVIDER_SITE_OTHER): Payer: Managed Care, Other (non HMO) | Admitting: Cardiovascular Disease

## 2013-08-06 ENCOUNTER — Encounter: Payer: Self-pay | Admitting: Cardiovascular Disease

## 2013-08-06 ENCOUNTER — Ambulatory Visit (HOSPITAL_COMMUNITY)
Admission: RE | Admit: 2013-08-06 | Discharge: 2013-08-06 | Disposition: A | Discharge status: Home / Self Care | Payer: Managed Care, Other (non HMO) | Source: Ambulatory Visit | Attending: Cardiovascular Disease | Admitting: Cardiovascular Disease

## 2013-08-06 ENCOUNTER — Encounter (HOSPITAL_COMMUNITY): Payer: Self-pay | Admitting: *Deleted

## 2013-08-06 VITALS — BP 140/84 | HR 100 | Ht 69.5 in | Wt 200.0 lb

## 2013-08-06 DIAGNOSIS — R0600 Dyspnea, unspecified: Secondary | ICD-10-CM

## 2013-08-06 DIAGNOSIS — I2510 Atherosclerotic heart disease of native coronary artery without angina pectoris: Secondary | ICD-10-CM

## 2013-08-06 DIAGNOSIS — Z79899 Other long term (current) drug therapy: Secondary | ICD-10-CM

## 2013-08-06 DIAGNOSIS — I739 Peripheral vascular disease, unspecified: Secondary | ICD-10-CM | POA: Insufficient documentation

## 2013-08-06 DIAGNOSIS — R0989 Other specified symptoms and signs involving the circulatory and respiratory systems: Secondary | ICD-10-CM | POA: Insufficient documentation

## 2013-08-06 DIAGNOSIS — R0609 Other forms of dyspnea: Secondary | ICD-10-CM | POA: Insufficient documentation

## 2013-08-06 DIAGNOSIS — I10 Essential (primary) hypertension: Secondary | ICD-10-CM

## 2013-08-06 MED ORDER — FUROSEMIDE 20 MG PO TABS: 20.0000 mg | ORAL_TABLET | Freq: Two times a day (BID) | ORAL | Status: DC

## 2013-08-06 MED ORDER — CARVEDILOL 6.25 MG PO TABS: 6.2500 mg | ORAL_TABLET | Freq: Two times a day (BID) | ORAL | Status: DC

## 2013-08-06 NOTE — Patient Instructions (Addendum)
  Your physician wants you to follow-up with him the week after Thanksgiving                                                                You will receive a reminder letter in the mail one month in advance. If you don't receive a letter, please call our office to schedule the follow-up appointment.   Your physician recommends that you return for lab work in: 3 weeks   Your physician has recommended you make the following change in your medication: Increase lasix to twice a day; Start Coreg 6.25mg  twice a day.  Stop the Metoprolol.   Your physician has ordered the following tests: Exercise myoview to be done next week. An echocardiogram will be done in 3 months.  Dr [name] has put in a referral for you to have a life vest placed.  The company that supplies that equipment is [employer].  They will contact you later today.

## 2013-08-06 NOTE — Assessment & Plan Note (Signed)
Patient had 3 vessel disease with moderate LV dysfunction by cath 05/05/13 assessment coronary artery bypass grafting x4 by Dr. [name] one week later. He developed shortness of breath earlier this week he was seen in the emergency room in talking to diagnosis of congestive heart failure. She was Lasix. He saw Boyce Medici, PA-C, in our office yesterday for evaluation of that. A 2-D echocardiogram performed today revealed an EF of 20-25%.I am unsure etiology for his decline in ejection fraction. I am going to get an exercise Myoview to determine whether or not he has adequate perfusion as a result of bypass grafting. I'm also going to change his metoprolol to carvedilol and him for a LifeVest which he will wear the next 3 months prior to getting a repeat 2-D echocardiogram. Hopefully we'll have improvement LV function. 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

## 2013-08-06 NOTE — Assessment & Plan Note (Addendum)
Pt was examined in the ED recently for evaluation of SOB. Pt noted resting dyspnea and what sounds like orthopnea. Cardiology was never consulted. W/u in ED revealed an elevated BNP of 3,550. CXR demonstrated increased vascular and interstitial prominence throughout both lungs, compatible with early edema. He was discharged home directly from the ED was was given a prescription of 20 mg Lasix daily. He was prescribed 15 tablets with no refills. Interestingly, there was no echo performed on him post CABG. I have ordered for patient to have an echo done. If significant systolic function noted, we will keep on current dose of Lasix daily  for fluid control. A low sodium diet was discussed as well as checking daily weights. Since Lasix was recently initiated, I have ordered for the patient to get a BMP to evaluate renal function and electrolytes. Will f/u with results.

## 2013-08-06 NOTE — Assessment & Plan Note (Addendum)
Stable. No Angina. Continue ASA, Plavix, BB, ACE-I and statin

## 2013-08-06 NOTE — Assessment & Plan Note (Signed)
Patient had severe claudication with peripheral vascular occlusive disease status post TurboHawk directional atherectomy of both superficial femoral arteries with recent lower extremity arterial Doppler studies revealing ABIs in the mid to high 0.8 range. Patient no longer has claudication.

## 2013-08-06 NOTE — Assessment & Plan Note (Signed)
Controlled on current medications 

## 2013-08-06 NOTE — Progress Notes (Signed)
2D Echo Performed 08/06/2013    Clearence Ped, RCS

## 2013-08-06 NOTE — Progress Notes (Signed)
08/06/2013 Dennis [name]   07-25-1946  [id number]  Primary Physician Dennis Scotland, MD Primary Cardiologist: Dennis Gess MD Dennis [name]   HPI:  [name] is a 67 year old married Caucasian male father of one, grandmother, grandfather is accompanied by his wife today. He was referred by [facility] at [facility] for evaluation of claudication and arterial Doppler studies which were obtained in our office 04/07/13. His cardiovascular risk factors include type 2 diabetes, hypertension, and hyperlipidemia. He has a 50-100-pack-year history of tobacco abuse currently smoking one pack to 2 packs a day. There is no family history of heart disease. He's never had a heart attack or stroke. He does complain of dyspnea on exertion. He has had claudication for the last 2 years worse over the last 3 months which is now lifestyle limiting. Doppler studies in our office performed 04/07/13 revealed a right ABI of 0.63 and a left ABI of 0.70. He had high-grade SFA disease bilaterally as well as tibial disease.because of a positive Myoview stress test the patient first had a diagnostic coronary arteriogram. Which showed 3 vessel disease with moderate LV dysfunction. He'll underwent coronary bypass grafting x4 by Dr. Rexanne [name] on 05/12/13 with a sequential LIMA to the LAD and diagonal branch, vein to obtuse marginal branch and to the PDA. His postop course was uncomplicated. Following his coronary artery bypass graft surgery he underwent staged bilateral superficial femoral artery directional atherectomy with marked improvement in his claudication and arterial Doppler studies. This week he developed shortness of breath and was seen in an emergency room and diagnosed with congestive heart failure. He was treated with Lasix and referred back to [place] for further evaluation. He saw Dennis Medici PA-C in our office yesterday who did a 2-D echocardiogram today that revealed an EF of 20-25%. This is a finding  now almost 3 months status post coronary revascularization.   Current Outpatient Prescriptions  Medication Sig Dispense Refill  . aspirin EC 81 MG tablet Take 1 tablet (81 mg total) by mouth daily.  90 tablet  3  . cetirizine (ZYRTEC) 10 MG tablet Take 10 mg by mouth daily.      . cilostazol (PLETAL) 100 MG tablet Take 100 mg by mouth 2 (two) times daily.       . clopidogrel (PLAVIX) 75 MG tablet Take 1 tablet (75 mg total) by mouth daily.  30 tablet  1  . furosemide (LASIX) 20 MG tablet Take 1 tablet (20 mg total) by mouth daily.  15 tablet  0  . glipiZIDE (GLUCOTROL XL) 10 MG 24 hr tablet Take 10 mg by mouth 2 (two) times daily.      . lansoprazole (PREVACID) 30 MG capsule Take 30 mg by mouth daily.       Marland Kitchen lisinopril (PRINIVIL,ZESTRIL) 40 MG tablet Take 40 mg by mouth daily.       . metFORMIN (GLUCOPHAGE) 1000 MG tablet Take 1 tablet (1,000 mg total) by mouth 2 (two) times daily with a meal.  60 tablet  1  . metoprolol tartrate (LOPRESSOR) 50 MG tablet Take 1 tablet (50 mg total) by mouth 2 (two) times daily.  180 tablet  3  . NIFEDICAL XL 60 MG 24 hr tablet Take 60 mg by mouth daily.      Marland Kitchen NITROSTAT 0.4 MG SL tablet Place 0.4 mg under the tongue every 5 (five) minutes as needed for chest pain.       . pioglitazone (ACTOS) 15 MG  tablet Take 1 tablet (15 mg total) by mouth 2 (two) times daily after a meal.  60 tablet  1  . pravastatin (PRAVACHOL) 40 MG tablet Take 40 mg by mouth daily.        No current facility-administered medications for this visit.    Allergies  Allergen Reactions  . Chlorhexidine Rash    History   Social History  . Marital Status: Married    Spouse Name: N/A    Number of Children: N/A  . Years of Education: N/A   Occupational History  . Not on file.   Social History Main Topics  . Smoking status: Former Smoker -- 1.00 packs/day for 55 years    Types: Cigarettes    Quit date: 05/05/2013  . Smokeless tobacco: Never Used     Comment: 07/07/2013 now  using E- Cig.  . Alcohol Use: Yes     Comment: 07/07/2013 "I'll have a beer once in a blue moon"  . Drug Use: No  . Sexual Activity: Not Currently   Other Topics Concern  . Not on file   Social History Narrative  . No narrative on file     Review of Systems: General: negative for chills, fever, night sweats or weight changes.  Cardiovascular: negative for chest pain, dyspnea on exertion, edema, orthopnea, palpitations, paroxysmal nocturnal dyspnea or shortness of breath Dermatological: negative for rash Respiratory: negative for cough or wheezing Urologic: negative for hematuria Abdominal: negative for nausea, vomiting, diarrhea, bright red blood per rectum, melena, or hematemesis Neurologic: negative for visual changes, syncope, or dizziness All other systems reviewed and are otherwise negative except as noted above.    Blood pressure 140/84, pulse 100, height 5' 9.5" (1.765 m), weight 200 lb (90.719 kg).  General appearance: alert and no distress Neck: no adenopathy, no carotid bruit, no JVD, supple, symmetrical, trachea midline and thyroid not enlarged, symmetric, no tenderness/mass/nodules Lungs: clear to auscultation bilaterally Heart: regular rate and rhythm, S1, S2 normal, no murmur, click, rub or gallop Extremities: extremities normal, atraumatic, no cyanosis or edema  EKG not performed today  ASSESSMENT AND PLAN:   CAD- s/p CABG X 4 05/12/13 Patient had 3 vessel disease with moderate LV dysfunction by cath 05/05/13 assessment coronary artery bypass grafting x4 by Dr. Rexanne [name] one week later. He developed shortness of breath earlier this week he was seen in the emergency room in talking to diagnosis of congestive heart failure. She was Lasix. He saw Dennis Medici, PA-C, in our office yesterday for evaluation of that. A 2-D echocardiogram performed today revealed an EF of 20-25%.I am unsure etiology for his decline in ejection fraction. I am going to get an exercise  Myoview to determine whether or not he has adequate perfusion as a result of bypass grafting. I'm also going to change his metoprolol to carvedilol and him for a LifeVest which he will wear the next 3 months prior to getting a repeat 2-D echocardiogram. Hopefully we'll have improvement LV function. 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  Claudication Patient had severe claudication with peripheral vascular occlusive disease status post TurboHawk directional atherectomy of both superficial femoral arteries with recent lower extremity arterial Doppler studies revealing ABIs in the mid to high 0.8 range. Patient no longer has claudication.  Essential hypertension Controlled on current medications      Dennis Gess MD [facility], [facility] 08/06/2013 2:32 PM

## 2013-08-07 NOTE — Telephone Encounter (Signed)
lmom 

## 2013-08-07 NOTE — Telephone Encounter (Signed)
Please call-still need to ask some questions regarding his disabilty form.Please call today-they want him back to work on Monday please.

## 2013-08-10 NOTE — Telephone Encounter (Signed)
Lm with [facility]

## 2013-08-12 ENCOUNTER — Ambulatory Visit (HOSPITAL_COMMUNITY)
Admission: RE | Admit: 2013-08-12 | Discharge: 2013-08-12 | Disposition: A | Discharge status: Home / Self Care | Payer: Managed Care, Other (non HMO) | Source: Ambulatory Visit | Attending: Cardiovascular Disease | Admitting: Cardiovascular Disease

## 2013-08-12 DIAGNOSIS — I509 Heart failure, unspecified: Secondary | ICD-10-CM | POA: Insufficient documentation

## 2013-08-12 DIAGNOSIS — Z8249 Family history of ischemic heart disease and other diseases of the circulatory system: Secondary | ICD-10-CM | POA: Insufficient documentation

## 2013-08-12 DIAGNOSIS — I2589 Other forms of chronic ischemic heart disease: Secondary | ICD-10-CM | POA: Insufficient documentation

## 2013-08-12 DIAGNOSIS — I2510 Atherosclerotic heart disease of native coronary artery without angina pectoris: Secondary | ICD-10-CM

## 2013-08-12 DIAGNOSIS — E119 Type 2 diabetes mellitus without complications: Secondary | ICD-10-CM | POA: Insufficient documentation

## 2013-08-12 DIAGNOSIS — R0609 Other forms of dyspnea: Secondary | ICD-10-CM | POA: Insufficient documentation

## 2013-08-12 DIAGNOSIS — R42 Dizziness and giddiness: Secondary | ICD-10-CM | POA: Insufficient documentation

## 2013-08-12 DIAGNOSIS — I739 Peripheral vascular disease, unspecified: Secondary | ICD-10-CM | POA: Insufficient documentation

## 2013-08-12 DIAGNOSIS — E663 Overweight: Secondary | ICD-10-CM | POA: Insufficient documentation

## 2013-08-12 DIAGNOSIS — Z87891 Personal history of nicotine dependence: Secondary | ICD-10-CM | POA: Insufficient documentation

## 2013-08-12 DIAGNOSIS — Z951 Presence of aortocoronary bypass graft: Secondary | ICD-10-CM | POA: Insufficient documentation

## 2013-08-12 DIAGNOSIS — Z9861 Coronary angioplasty status: Secondary | ICD-10-CM | POA: Insufficient documentation

## 2013-08-12 DIAGNOSIS — R0989 Other specified symptoms and signs involving the circulatory and respiratory systems: Secondary | ICD-10-CM | POA: Insufficient documentation

## 2013-08-12 DIAGNOSIS — I10 Essential (primary) hypertension: Secondary | ICD-10-CM | POA: Insufficient documentation

## 2013-08-12 MED ORDER — TECHNETIUM TC 99M SESTAMIBI GENERIC - CARDIOLITE
30.0000 | Freq: Once | INTRAVENOUS | Status: AC | PRN
  Administered 2013-08-12: 30 via INTRAVENOUS

## 2013-08-12 MED ORDER — TECHNETIUM TC 99M SESTAMIBI GENERIC - CARDIOLITE
10.0000 | Freq: Once | INTRAVENOUS | Status: AC | PRN
  Administered 2013-08-12: 10 via INTRAVENOUS

## 2013-08-12 NOTE — Procedures (Addendum)
[facility] [address] [place] [address] [phone]  Cardiology Nuclear Med Study  Dennis [name] is a 67 y.o. male     MRN : [id number]     DOB: 04-06-1946  Procedure Date: 08/12/2013  Nuclear Med Background Indication for Stress Test:  Graft Patency, Abnormal EKG and PVC'S;TACHYCARDIA History:  CAD;CABG X4--05/12/13;PTCA--07/07/2013;ARTHRECTOMY;ISCHEMIC CARDIOMYOPATHY;VEST DEFIBRILLATOR;CHF Cardiac Risk Factors: Claudication, Family History - CAD, History of Smoking, Hypertension, Lipids, NIDDM, Overweight and PVD  Symptoms:  Dizziness, DOE, Light-Headedness and SOB   Nuclear Pre-Procedure Caffeine/Decaff Intake:  7:00pm NPO After: 5:00am   IV Site: R Forearm  IV 0.9% NS with Angio Cath:  22g  Chest Size (in):  42"  IV Started by: Emmit Pomfret, RN  Height: 5\' 10"  (1.778 m)  Cup Size: n/a  BMI:  Body mass index is 28.7 kg/(m^2). Weight:  200 lb (90.719 kg)   Tech Comments:  N/A    Nuclear Med Study 1 or 2 day study: 1 day  Stress Test Type:  Stress  Order Authorizing Provider:  Nanetta Batty, MD   Resting Radionuclide: Technetium 15m Sestamibi  Resting Radionuclide Dose: 10.4 mCi   Stress Radionuclide:  Technetium 4m Sestamibi  Stress Radionuclide Dose: 31.1 mCi           Stress Protocol Rest HR: 109 Stress HR: 136  Rest BP: 141/86 Stress BP: 180/90  Exercise Time (min): 4:28 METS: 4.6   Predicted Max HR: 152 bpm % Max HR: 89.47 bpm Rate Pressure Product: 21308  Dose of Adenosine (mg):  n/a Dose of Lexiscan: n/a mg  Dose of Atropine (mg): n/a Dose of Dobutamine: n/a mcg/kg/min (at max HR)  Stress Test Technologist: Esperanza Sheets, CCT Nuclear Technologist: Koren Shiver, CNMT   Rest Procedure:  Myocardial perfusion imaging was performed at rest 45 minutes following the intravenous administration of Technetium 13m Sestamibi. Stress Procedure:  The patient performed treadmill exercise using a Bruce   Protocol for 4:28 minutes. The patient stopped due to SOB and leg discomfort and denied any chest pain.  There were no significant ST-T wave changes.  Technetium 18m Sestamibi was injected at peak exercise and myocardial perfusion imaging was performed after a brief delay.  Transient Ischemic Dilatation (Normal <1.22):  1.00 Lung/Heart Ratio (Normal <0.45):  0.38 QGS EDV:  179 ml QGS ESV:  132 ml LV Ejection Fraction: 26%  Rest ECG: NSR with anterior infarct pattern  Stress ECG: There are scattered PVCs.  QPS Raw Data Images:  Normal; no motion artifact; normal heart/lung ratio. Stress Images:  Large fixed inferoapical scar Rest Images:  Large fixed inferoapical scar Subtraction (SDS):  No evidence of ischemia.  Impression Exercise Capacity:  Poor exercise capacity. BP Response:  Hypertensive blood pressure response. Clinical Symptoms:  There is dyspnea. ECG Impression:  There are scattered PVCs. Comparison with Prior Nuclear Study: No images to compare  Overall Impression:  Intermediate risk stress nuclear study with fixed inferoapical scar.  No reversible ischemia..  LV Wall Motion:  Dilated ventricle with severe global hypokinesis and inferior akinesis. EF 26%  Chrystie Nose, MD, [place] Board Certified in Nuclear Cardiology Attending Cardiologist [facility]   Chrystie Nose, MD  08/12/2013 10:53 AM

## 2013-08-14 DIAGNOSIS — I5042 Chronic combined systolic (congestive) and diastolic (congestive) heart failure: Secondary | ICD-10-CM | POA: Insufficient documentation

## 2013-08-17 DIAGNOSIS — J439 Emphysema, unspecified: Secondary | ICD-10-CM | POA: Insufficient documentation

## 2013-08-20 ENCOUNTER — Ambulatory Visit (INDEPENDENT_AMBULATORY_CARE_PROVIDER_SITE_OTHER): Payer: Managed Care, Other (non HMO) | Admitting: Cardiovascular Disease

## 2013-08-20 ENCOUNTER — Encounter: Payer: Self-pay | Admitting: Cardiovascular Disease

## 2013-08-20 VITALS — BP 146/72 | HR 68 | Ht 69.5 in | Wt 205.0 lb

## 2013-08-20 DIAGNOSIS — Z79899 Other long term (current) drug therapy: Secondary | ICD-10-CM

## 2013-08-20 DIAGNOSIS — I255 Ischemic cardiomyopathy: Secondary | ICD-10-CM

## 2013-08-20 DIAGNOSIS — I2589 Other forms of chronic ischemic heart disease: Secondary | ICD-10-CM

## 2013-08-20 DIAGNOSIS — D689 Coagulation defect, unspecified: Secondary | ICD-10-CM

## 2013-08-20 DIAGNOSIS — Z01818 Encounter for other preprocedural examination: Secondary | ICD-10-CM

## 2013-08-20 LAB — CBC
MCHC: 32.3 g/dL (ref 30.0–36.0)
MCV: 78.7 fL (ref 78.0–100.0)
Platelets: 290 10*3/uL (ref 150–400)
RDW: 13.9 % (ref 11.5–15.5)
WBC: 6.4 10*3/uL (ref 4.0–10.5)

## 2013-08-20 NOTE — Patient Instructions (Signed)
Your physician has requested that you have a cardiac catheterization. Cardiac catheterization is used to diagnose and/or treat various heart conditions. Doctors may recommend this procedure for a number of different reasons. The most common reason is to evaluate chest pain. Chest pain can be a symptom of coronary artery disease (CAD), and cardiac catheterization can show whether plaque is narrowing or blocking your heart's arteries. This procedure is also used to evaluate the valves, as well as measure the blood flow and oxygen levels in different parts of your heart. For further information please visit www.cardiosmart.org.   Following your catheterization, you will not be allowed to drive for 3 days.  No lifting, pushing, or pulling greater that 10 pounds is allowed for 1 week.  When the procedure is scheduled, you will be given a date to have bloodwork done.      

## 2013-08-20 NOTE — Progress Notes (Signed)
   08/20/2013 Dennis [name]   06/28/1946  [id number]  Primary Physician Dennis S Jackson, MD Primary Cardiologist: Dennis Lorensen J. Aster Eckrich MD FACP,FACC,FAHA, FSCAI   HPI:  [name] is a 67-year-old married Caucasian male father of one, grandmother, grandfather is accompanied by his wife today. He was referred by [facility] for evaluation of claudication and arterial Doppler studies which were obtained in our office 04/07/13. His cardiovascular risk factors include type 2 diabetes, hypertension, and hyperlipidemia. He has a 50-100-pack-year history of tobacco abuse currently smoking one pack to 2 packs a day. There is no family history of heart disease. He's never had a heart attack or stroke. He does complain of dyspnea on exertion. He has had claudication for the last 2 years worse over the last 3 months which is now lifestyle limiting. Doppler studies in our office performed 04/07/13 revealed a right ABI of 0.63 and a left ABI of 0.70. He had high-grade SFA disease bilaterally as well as tibial disease.because of a positive Myoview stress test the patient first had a diagnostic coronary arteriogram. Which showed 3 vessel disease with moderate LV dysfunction. He'll underwent coronary bypass grafting x4 by Dr. Brian Bartle on 05/12/13 with a sequential LIMA to the LAD and diagonal branch, vein to obtuse marginal branch and to the PDA. His postop course was uncomplicated. Following his coronary artery bypass graft surgery he underwent staged bilateral superficial femoral artery directional atherectomy with marked improvement in his claudication and arterial Doppler studies. This week he developed shortness of breath and was seen in an emergency room and diagnosed with congestive heart failure. He was treated with Lasix and referred back to us for further evaluation. He saw Brittany Simmons PA-C in our office yesterday who did a 2-D echocardiogram today that revealed an EF of 20-25%. This is a finding  now almost 3 months status post coronary revascularization. He is currently wearing a LifeVest. A recent 2-D echo revealed an ejection fraction of 20-25% with global hypokinesia. A Myoview stress test showed inferior scar. Patient is going to proceed with cardiac catheterization and if his grafts are demonstrated to be patent we'll transition him to an ICD    Current Outpatient Prescriptions  Medication Sig Dispense Refill  . aspirin EC 81 MG tablet Take 1 tablet (81 mg total) by mouth daily.  90 tablet  3  . carvedilol (COREG) 6.25 MG tablet Take 1 tablet (6.25 mg total) by mouth 2 (two) times daily.  60 tablet  6  . cetirizine (ZYRTEC) 10 MG tablet Take 10 mg by mouth daily.      . cilostazol (PLETAL) 100 MG tablet Take 100 mg by mouth 2 (two) times daily.       . clopidogrel (PLAVIX) 75 MG tablet Take 1 tablet (75 mg total) by mouth daily.  30 tablet  1  . furosemide (LASIX) 20 MG tablet Take 1 tablet (20 mg total) by mouth 2 (two) times daily.  60 tablet  6  . insulin aspart protamine- aspart (NOVOLOG MIX 70/30) (70-30) 100 UNIT/ML injection Sliding scale. Inject 4-16 units before heaviest meal or as needed.      . insulin glargine (LANTUS) 100 UNIT/ML injection Inject 28 Units into the skin daily. Increase 2 units per day for blood sugar greater than 150.      . lansoprazole (PREVACID) 30 MG capsule Take 30 mg by mouth daily.       . lisinopril (PRINIVIL,ZESTRIL) 40 MG tablet Take 40 mg   by mouth daily.       . metFORMIN (GLUCOPHAGE) 1000 MG tablet Take 1 tablet (1,000 mg total) by mouth 2 (two) times daily with a meal.  60 tablet  1  . pravastatin (PRAVACHOL) 40 MG tablet Take 40 mg by mouth daily.       . NITROSTAT 0.4 MG SL tablet Place 0.4 mg under the tongue every 5 (five) minutes as needed for chest pain.        No current facility-administered medications for this visit.    Allergies  Allergen Reactions  . Chlorhexidine Rash    History   Social History  . Marital Status:  Married    Spouse Name: N/A    Number of Children: N/A  . Years of Education: N/A   Occupational History  . Not on file.   Social History Main Topics  . Smoking status: Former Smoker -- 1.00 packs/day for 55 years    Types: Cigarettes    Quit date: 05/05/2013  . Smokeless tobacco: Never Used     Comment: 07/07/2013 now using E- Cig.  . Alcohol Use: Yes     Comment: 07/07/2013 "I'll have a beer once in a blue moon"  . Drug Use: No  . Sexual Activity: Not Currently   Other Topics Concern  . Not on file   Social History Narrative  . No narrative on file     Review of Systems: General: negative for chills, fever, night sweats or weight changes.  Cardiovascular: negative for chest pain, dyspnea on exertion, edema, orthopnea, palpitations, paroxysmal nocturnal dyspnea or shortness of breath Dermatological: negative for rash Respiratory: negative for cough or wheezing Urologic: negative for hematuria Abdominal: negative for nausea, vomiting, diarrhea, bright red blood per rectum, melena, or hematemesis Neurologic: negative for visual changes, syncope, or dizziness All other systems reviewed and are otherwise negative except as noted above.    Blood pressure 146/72, pulse 68, height 5' 9.5" (1.765 m), weight 205 lb (92.987 kg).  General appearance: alert and no distress Neck: no adenopathy, no carotid bruit, no JVD, supple, symmetrical, trachea midline and thyroid not enlarged, symmetric, no tenderness/mass/nodules Lungs: clear to auscultation bilaterally Heart: regular rate and rhythm, S1, S2 normal, no murmur, click, rub or gallop Extremities: extremities normal, atraumatic, no cyanosis or edema  EKG not performed today  ASSESSMENT AND PLAN:   Cardiomyopathy, ischemic Status post coronary bypass grafting x4 approximately 3 months ago. That time his EF was 35-40%. He had recent admission for congestive heart failure. 2-D echo revealed EF of 20-25%  And a Myoview stress test  showed inferior scar. He is currently wearing a life vest. The plan is to perform cardiac catheterization to define his anatomy and make sure his grafts are patent and if so position him to an implantable ICD.      Mallerie Blok J. Kaelin Bonelli MD FACP,FACC,FAHA, FSCAI 08/20/2013 4:07 PM  

## 2013-08-20 NOTE — Assessment & Plan Note (Addendum)
Status post coronary bypass grafting x4 approximately 3 months ago. That time his EF was 35-40%. He had recent admission for congestive heart failure. 2-D echo revealed EF of 20-25%  And a Myoview stress test showed inferior scar. He is currently wearing a life vest. The plan is to perform cardiac catheterization to define his anatomy and make sure his grafts are patent and if so position him to an implantable ICD.

## 2013-08-21 ENCOUNTER — Encounter (HOSPITAL_COMMUNITY): Payer: Self-pay | Admitting: Pharmacist

## 2013-08-21 LAB — BASIC METABOLIC PANEL
BUN: 20 mg/dL (ref 6–23)
CO2: 26 mEq/L (ref 19–32)
Calcium: 9.3 mg/dL (ref 8.4–10.5)
Chloride: 99 mEq/L (ref 96–112)
Creat: 1.13 mg/dL (ref 0.50–1.35)
Glucose, Bld: 373 mg/dL — ABNORMAL HIGH (ref 70–99)

## 2013-08-21 LAB — PROTIME-INR
INR: 0.95 (ref <1.50)
Prothrombin Time: 12.7 seconds (ref 11.6–15.2)

## 2013-08-25 ENCOUNTER — Encounter (HOSPITAL_COMMUNITY): Payer: Self-pay | Admitting: General Practice

## 2013-08-25 ENCOUNTER — Encounter (HOSPITAL_COMMUNITY)
Admission: RE | Disposition: A | Discharge status: Home / Self Care | Payer: Managed Care, Other (non HMO) | Source: Ambulatory Visit | Attending: Cardiovascular Disease

## 2013-08-25 ENCOUNTER — Ambulatory Visit (HOSPITAL_COMMUNITY)
Admission: RE | Admit: 2013-08-25 | Discharge: 2013-08-26 | Disposition: A | Discharge status: Home / Self Care | Payer: Managed Care, Other (non HMO) | Source: Ambulatory Visit | Attending: Cardiovascular Disease | Admitting: Cardiovascular Disease

## 2013-08-25 DIAGNOSIS — E785 Hyperlipidemia, unspecified: Secondary | ICD-10-CM | POA: Insufficient documentation

## 2013-08-25 DIAGNOSIS — R931 Abnormal findings on diagnostic imaging of heart and coronary circulation: Secondary | ICD-10-CM | POA: Diagnosis present

## 2013-08-25 DIAGNOSIS — E1159 Type 2 diabetes mellitus with other circulatory complications: Secondary | ICD-10-CM | POA: Diagnosis present

## 2013-08-25 DIAGNOSIS — Z951 Presence of aortocoronary bypass graft: Secondary | ICD-10-CM | POA: Diagnosis present

## 2013-08-25 DIAGNOSIS — F17200 Nicotine dependence, unspecified, uncomplicated: Secondary | ICD-10-CM | POA: Insufficient documentation

## 2013-08-25 DIAGNOSIS — E119 Type 2 diabetes mellitus without complications: Secondary | ICD-10-CM | POA: Insufficient documentation

## 2013-08-25 DIAGNOSIS — Z7902 Long term (current) use of antithrombotics/antiplatelets: Secondary | ICD-10-CM | POA: Insufficient documentation

## 2013-08-25 DIAGNOSIS — I2510 Atherosclerotic heart disease of native coronary artery without angina pectoris: Secondary | ICD-10-CM

## 2013-08-25 DIAGNOSIS — I509 Heart failure, unspecified: Secondary | ICD-10-CM | POA: Insufficient documentation

## 2013-08-25 DIAGNOSIS — I25810 Atherosclerosis of coronary artery bypass graft(s) without angina pectoris: Secondary | ICD-10-CM

## 2013-08-25 DIAGNOSIS — I255 Ischemic cardiomyopathy: Secondary | ICD-10-CM

## 2013-08-25 DIAGNOSIS — I739 Peripheral vascular disease, unspecified: Secondary | ICD-10-CM | POA: Diagnosis present

## 2013-08-25 DIAGNOSIS — I2589 Other forms of chronic ischemic heart disease: Secondary | ICD-10-CM | POA: Insufficient documentation

## 2013-08-25 DIAGNOSIS — Z720 Tobacco use: Secondary | ICD-10-CM | POA: Diagnosis present

## 2013-08-25 DIAGNOSIS — Z01818 Encounter for other preprocedural examination: Secondary | ICD-10-CM

## 2013-08-25 DIAGNOSIS — Z7982 Long term (current) use of aspirin: Secondary | ICD-10-CM | POA: Insufficient documentation

## 2013-08-25 DIAGNOSIS — R0609 Other forms of dyspnea: Secondary | ICD-10-CM

## 2013-08-25 DIAGNOSIS — I70219 Atherosclerosis of native arteries of extremities with intermittent claudication, unspecified extremity: Secondary | ICD-10-CM | POA: Insufficient documentation

## 2013-08-25 DIAGNOSIS — I10 Essential (primary) hypertension: Secondary | ICD-10-CM | POA: Insufficient documentation

## 2013-08-25 HISTORY — PX: LEFT HEART CATHETERIZATION WITH CORONARY/GRAFT ANGIOGRAM: SHX5450

## 2013-08-25 HISTORY — PX: PERCUTANEOUS STENT INTERVENTION: SHX5500

## 2013-08-25 HISTORY — PX: PATENT DUCTUS ARTERIOUS REPAIR: SHX269

## 2013-08-25 LAB — GLUCOSE, CAPILLARY
Glucose-Capillary: 249 mg/dL — ABNORMAL HIGH (ref 70–99)
Glucose-Capillary: 227 mg/dL — ABNORMAL HIGH (ref 70–99)

## 2013-08-25 LAB — POCT ACTIVATED CLOTTING TIME: Activated Clotting Time: 288 seconds

## 2013-08-25 SURGERY — LEFT HEART CATHETERIZATION WITH CORONARY/GRAFT ANGIOGRAM
Anesthesia: LOCAL

## 2013-08-25 MED ORDER — ASPIRIN 81 MG PO CHEW: 81.0000 mg | CHEWABLE_TABLET | ORAL | Status: DC

## 2013-08-25 MED ORDER — CARVEDILOL 6.25 MG PO TABS
6.2500 mg | ORAL_TABLET | Freq: Two times a day (BID) | ORAL | Status: DC
  Administered 2013-08-25 – 2013-08-26 (×3): 6.25 mg via ORAL
  Filled 2013-08-25 (×4): qty 1

## 2013-08-25 MED ORDER — DIAZEPAM 5 MG PO TABS
5.0000 mg | ORAL_TABLET | ORAL | Status: AC
  Administered 2013-08-25: 5 mg via ORAL

## 2013-08-25 MED ORDER — LIDOCAINE HCL (PF) 1 % IJ SOLN
INTRAMUSCULAR | Status: AC
  Filled 2013-08-25: qty 30

## 2013-08-25 MED ORDER — CLOPIDOGREL BISULFATE 300 MG PO TABS
ORAL_TABLET | ORAL | Status: AC
  Filled 2013-08-25: qty 1

## 2013-08-25 MED ORDER — ACETAMINOPHEN 325 MG PO TABS: 650.0000 mg | ORAL_TABLET | ORAL | Status: DC | PRN

## 2013-08-25 MED ORDER — ASPIRIN 81 MG PO CHEW
CHEWABLE_TABLET | ORAL | Status: AC
  Filled 2013-08-25: qty 1

## 2013-08-25 MED ORDER — BIVALIRUDIN 250 MG IV SOLR
INTRAVENOUS | Status: AC
  Filled 2013-08-25: qty 250

## 2013-08-25 MED ORDER — FENTANYL CITRATE 0.05 MG/ML IJ SOLN
INTRAMUSCULAR | Status: AC
  Filled 2013-08-25: qty 2

## 2013-08-25 MED ORDER — NITROGLYCERIN 0.4 MG SL SUBL: 0.4000 mg | SUBLINGUAL_TABLET | SUBLINGUAL | Status: DC | PRN

## 2013-08-25 MED ORDER — HEPARIN (PORCINE) IN NACL 2-0.9 UNIT/ML-% IJ SOLN
INTRAMUSCULAR | Status: AC
  Filled 2013-08-25: qty 1500

## 2013-08-25 MED ORDER — MORPHINE SULFATE 2 MG/ML IJ SOLN: 1.0000 mg | INTRAMUSCULAR | Status: DC | PRN

## 2013-08-25 MED ORDER — SODIUM CHLORIDE 0.9 % IV SOLN
0.2500 mg/kg/h | INTRAVENOUS | Status: DC
  Filled 2013-08-25: qty 250

## 2013-08-25 MED ORDER — ASPIRIN 81 MG PO CHEW
CHEWABLE_TABLET | ORAL | Status: AC
  Filled 2013-08-25: qty 2

## 2013-08-25 MED ORDER — CLOPIDOGREL BISULFATE 75 MG PO TABS: 75.0000 mg | ORAL_TABLET | Freq: Every day | ORAL | Status: DC

## 2013-08-25 MED ORDER — ASPIRIN EC 81 MG PO TBEC
81.0000 mg | DELAYED_RELEASE_TABLET | Freq: Every day | ORAL | Status: DC
  Administered 2013-08-26: 11:00:00 81 mg via ORAL
  Filled 2013-08-25: qty 1

## 2013-08-25 MED ORDER — MIDAZOLAM HCL 2 MG/2ML IJ SOLN
INTRAMUSCULAR | Status: AC
  Filled 2013-08-25: qty 2

## 2013-08-25 MED ORDER — DIAZEPAM 5 MG PO TABS
ORAL_TABLET | ORAL | Status: AC
  Filled 2013-08-25: qty 1

## 2013-08-25 MED ORDER — INSULIN GLARGINE 100 UNITS/ML SOLOSTAR PEN: 20.0000 [IU] | PEN_INJECTOR | SUBCUTANEOUS | Status: DC

## 2013-08-25 MED ORDER — LORATADINE 10 MG PO TABS
10.0000 mg | ORAL_TABLET | Freq: Every day | ORAL | Status: DC
  Administered 2013-08-26: 10 mg via ORAL
  Filled 2013-08-25: qty 1

## 2013-08-25 MED ORDER — ASPIRIN 81 MG PO CHEW: 81.0000 mg | CHEWABLE_TABLET | Freq: Every day | ORAL | Status: DC

## 2013-08-25 MED ORDER — FUROSEMIDE 20 MG PO TABS
20.0000 mg | ORAL_TABLET | Freq: Two times a day (BID) | ORAL | Status: DC
  Administered 2013-08-25 – 2013-08-26 (×2): 20 mg via ORAL
  Filled 2013-08-25 (×4): qty 1

## 2013-08-25 MED ORDER — SODIUM CHLORIDE 0.9 % IJ SOLN: 3.0000 mL | INTRAMUSCULAR | Status: DC | PRN

## 2013-08-25 MED ORDER — INSULIN GLARGINE 100 UNIT/ML ~~LOC~~ SOLN
20.0000 [IU] | Freq: Every day | SUBCUTANEOUS | Status: DC
  Administered 2013-08-25 – 2013-08-26 (×2): 20 [IU] via SUBCUTANEOUS
  Filled 2013-08-25 (×2): qty 0.2

## 2013-08-25 MED ORDER — SODIUM CHLORIDE 0.9 % IV SOLN
INTRAVENOUS | Status: DC
  Administered 2013-08-25: 07:00:00 via INTRAVENOUS

## 2013-08-25 MED ORDER — PANTOPRAZOLE SODIUM 40 MG PO TBEC
40.0000 mg | DELAYED_RELEASE_TABLET | Freq: Every day | ORAL | Status: DC
  Administered 2013-08-25 – 2013-08-26 (×2): 40 mg via ORAL
  Filled 2013-08-25 (×2): qty 1

## 2013-08-25 MED ORDER — INSULIN ASPART 100 UNIT/ML FLEXPEN: 4.0000 [IU] | PEN_INJECTOR | Freq: Three times a day (TID) | SUBCUTANEOUS | Status: DC

## 2013-08-25 MED ORDER — ONDANSETRON HCL 4 MG/2ML IJ SOLN: 4.0000 mg | Freq: Four times a day (QID) | INTRAMUSCULAR | Status: DC | PRN

## 2013-08-25 MED ORDER — LISINOPRIL 40 MG PO TABS
40.0000 mg | ORAL_TABLET | Freq: Every day | ORAL | Status: DC
  Administered 2013-08-26: 11:00:00 40 mg via ORAL
  Filled 2013-08-25: qty 1

## 2013-08-25 MED ORDER — NITROGLYCERIN 0.2 MG/ML ON CALL CATH LAB
INTRAVENOUS | Status: AC
  Filled 2013-08-25: qty 1

## 2013-08-25 MED ORDER — CILOSTAZOL 100 MG PO TABS
100.0000 mg | ORAL_TABLET | Freq: Two times a day (BID) | ORAL | Status: DC
  Administered 2013-08-25 – 2013-08-26 (×2): 100 mg via ORAL
  Filled 2013-08-25 (×3): qty 1

## 2013-08-25 MED ORDER — SODIUM CHLORIDE 0.9 % IV SOLN
INTRAVENOUS | Status: AC
  Administered 2013-08-25: 09:00:00 via INTRAVENOUS

## 2013-08-25 MED ORDER — INSULIN ASPART 100 UNIT/ML ~~LOC~~ SOLN
4.0000 [IU] | Freq: Three times a day (TID) | SUBCUTANEOUS | Status: DC
  Administered 2013-08-25: 12 [IU] via SUBCUTANEOUS
  Administered 2013-08-26: 14 [IU] via SUBCUTANEOUS

## 2013-08-25 MED ORDER — ALUM & MAG HYDROXIDE-SIMETH 200-200-20 MG/5ML PO SUSP
30.0000 mL | ORAL | Status: DC | PRN
  Administered 2013-08-25: 21:00:00 30 mL via ORAL
  Filled 2013-08-25: qty 30

## 2013-08-25 MED ORDER — CLOPIDOGREL BISULFATE 75 MG PO TABS
75.0000 mg | ORAL_TABLET | Freq: Every day | ORAL | Status: DC
  Administered 2013-08-26: 75 mg via ORAL

## 2013-08-25 NOTE — CV Procedure (Signed)
Dennis [name] is a 67 y.o. male    [id number] LOCATION:  FACILITY: [facility]  PHYSICIAN: Nanetta Batty, M.D. 1946-07-10   DATE OF PROCEDURE:  08/25/2013  DATE OF DISCHARGE:     CARDIAC CATHETERIZATION     History obtained from chart review.[name] is a 67 year old married Caucasian male father of one, grandmother, grandfather is accompanied by his wife today. He was referred by [facility] at [facility] for evaluation of claudication and arterial Doppler studies which were obtained in our office 04/07/13. His cardiovascular risk factors include type 2 diabetes, hypertension, and hyperlipidemia. He has a 50-100-pack-year history of tobacco abuse currently smoking one pack to 2 packs a day. There is no family history of heart disease. He's never had a heart attack or stroke. He does complain of dyspnea on exertion. He has had claudication for the last 2 years worse over the last 3 months which is now lifestyle limiting. Doppler studies in our office performed 04/07/13 revealed a right ABI of 0.63 and a left ABI of 0.70. He had high-grade SFA disease bilaterally as well as tibial disease.because of a positive Myoview stress test the patient first had a diagnostic coronary arteriogram. Which showed 3 vessel disease with moderate LV dysfunction. He'll underwent coronary bypass grafting x4 by Dr. [name] on 05/12/13 with a sequential LIMA to the LAD and diagonal branch, vein to obtuse marginal branch and to the PDA. His postop course was uncomplicated. Following his coronary artery bypass graft surgery he underwent staged bilateral superficial femoral artery directional atherectomy with marked improvement in his claudication and arterial Doppler studies. This week he developed shortness of breath and was seen in an emergency room and diagnosed with congestive heart failure. He was treated with Lasix and referred back to [place] for further evaluation. He saw [name] in our office yesterday who did  a 2-D echocardiogram today that revealed an EF of 20-25%. This is a finding now almost 3 months status post coronary revascularization.  He is currently wearing a LifeVest. A recent 2-D echo revealed an ejection fraction of 20-25% with global hypokinesia. A Myoview stress test showed inferior scar. Patient is going to proceed with cardiac catheterization and if his grafts are demonstrated to be patent we'll transition him to an ICD    PROCEDURE DESCRIPTION:   The patient was brought to the second floor [facility] cath lab in the postabsorptive state. He was premedicated with Valium 10 mg by mouth, IV Versed and fentanyl. His right groinwas prepped and shaved in usual sterile fashion. Xylocaine 1% was used for local anesthesia. A 5 French sheath was inserted into the right common femoral artery using standard Seldinger technique. 5 French right and left Judkins diagnostic catheters with a 5 French pigtail catheter were used for selective coronary angiography, selective vein graft and IMA angiography, and left ventriculography. Visipaque was used for the entirety of the case and   HEMODYNAMICS:    AO SYSTOLIC/AO DIASTOLIC: 111/64 LV SYSTOLIC/LV DIASTOLIC: 115/14 ANGIOGRAPHIC RESULTS:   1. Left main; normal  2. LAD; 95% proximal to mid, document was occluded at its ostium. There is competitive flow noted. 3. Left circumflex; had 90% proximal stenosis with competitive flow in is a nondominant vessel.  4. Right coronary artery; large dominant vessel with a 90% mid stenosis. 5.LIMA TO LAD; widely patent sequential LIMA to the LAD and diagonal branches 6. SVG TO obtuse marginal branch #2 was widely patent     SVG TO the PDA  had a 95% mid shaft stenosis    7. Left ventriculography; RAO left ventriculogram was performed using  25 mL of Visipaque dye at 12 mL/second. The overall LVEF estimated  20 %  Without  wall motion abnormalities  IMPRESSION:Mr. Has patent grafts, severe dysfunction  with a known the mid SVG PDA stenosis. . Certainly possible that this may explain to some degree he was dysfunction declined since his bypass surgery. The plan will be to proceed with stenting of his mid PDA SVG with DES.  Procedure description: 5 French sheath was exchanged for a 6 [place]. The patient received an additional 300 mg of by mouth Plavix. He was on 75 mg daily. He also received an additional 3 to baby aspirin. Intimax this was given with an ACT of 288. Total contrast administered to the patient was 175 cc. Using a 6 [place] RCB guide along with a 1/190 cm long [place] guidewire and a 5 mm Spider distal protection device, the lesion was crossed. I predilated with a 2 mm x 12 mm long emerge balloon. I stented with a 3 mm x 18 mm long expedition drug-eluting stent. I then postdilated with a 3.25 upgrading to a 3.5 x 15 mm long [place] emu balloon at 16 atmospheres (3.5 mm) resulting in reduction of a 99% mid PDA SVG stenosis to 0% residual with excellent distal flow. I then captured the Spider protection device and performed completion angiography.   Final impression: Successful PCI and stenting using drug-eluting stent of mid shaft PDA SVG with DES. The guidewire and guide catheter were removed. The sheath was secured. The patient left the Cath Lab in stable condition. He'll be gently hydrated overnight, discharged home in the morning on antiplatelet therapy. He ill wear his LifeVest at DC and will continue to monitor his left ventricular function.  [name] MD, [facility] 08/25/2013 9:15 AM

## 2013-08-25 NOTE — Interval H&P Note (Signed)
Cath Lab Visit (complete for each Cath Lab visit)  Clinical Evaluation Leading to the Procedure:   ACS: no  Non-ACS:    Anginal Classification: No Symptoms  Anti-ischemic medical therapy: Minimal Therapy (1 class of medications)  Non-Invasive Test Results: Intermediate-risk stress test findings: cardiac mortality 1-3%/year  Prior CABG: Previous CABG      History and Physical Interval Note:  08/25/2013 7:37 AM  Dennis [name]  has presented today for surgery, with the diagnosis of Chest pain  The various methods of treatment have been discussed with the patient and family. After consideration of risks, benefits and other options for treatment, the patient has consented to  Procedure(s): LEFT HEART CATHETERIZATION WITH CORONARY/GRAFT ANGIOGRAM (N/A) as a surgical intervention .  The patient's history has been reviewed, patient examined, no change in status, stable for surgery.  I have reviewed the patient's chart and labs.  Questions were answered to the patient's satisfaction.     [name]

## 2013-08-25 NOTE — H&P (View-Only) (Signed)
08/20/2013 Dennis [name]   May 24, 1946  [id number]  Primary Physician Dennis Scotland, MD Primary Cardiologist: Dennis Gess MD Dennis [name]   HPI:  [name] is a 67 year old married Caucasian male father of one, grandmother, grandfather is accompanied by his wife today. He was referred by [facility] at [facility] for evaluation of claudication and arterial Doppler studies which were obtained in our office 04/07/13. His cardiovascular risk factors include type 2 diabetes, hypertension, and hyperlipidemia. He has a 50-100-pack-year history of tobacco abuse currently smoking one pack to 2 packs a day. There is no family history of heart disease. He's never had a heart attack or stroke. He does complain of dyspnea on exertion. He has had claudication for the last 2 years worse over the last 3 months which is now lifestyle limiting. Doppler studies in our office performed 04/07/13 revealed a right ABI of 0.63 and a left ABI of 0.70. He had high-grade SFA disease bilaterally as well as tibial disease.because of a positive Myoview stress test the patient first had a diagnostic coronary arteriogram. Which showed 3 vessel disease with moderate LV dysfunction. He'll underwent coronary bypass grafting x4 by Dr. Rexanne [name] on 05/12/13 with a sequential LIMA to the LAD and diagonal branch, vein to obtuse marginal branch and to the PDA. His postop course was uncomplicated. Following his coronary artery bypass graft surgery he underwent staged bilateral superficial femoral artery directional atherectomy with marked improvement in his claudication and arterial Doppler studies. This week he developed shortness of breath and was seen in an emergency room and diagnosed with congestive heart failure. He was treated with Lasix and referred back to [place] for further evaluation. He saw Dennis Medici PA-C in our office yesterday who did a 2-D echocardiogram today that revealed an EF of 20-25%. This is a finding  now almost 3 months status post coronary revascularization. He is currently wearing a Dennis [name]. A recent 2-D echo revealed an ejection fraction of 20-25% with global hypokinesia. A Myoview stress test showed inferior scar. Patient is going to proceed with cardiac catheterization and if his grafts are demonstrated to be patent we'll transition him to an ICD    Current Outpatient Prescriptions  Medication Sig Dispense Refill  . aspirin EC 81 MG tablet Take 1 tablet (81 mg total) by mouth daily.  90 tablet  3  . carvedilol (COREG) 6.25 MG tablet Take 1 tablet (6.25 mg total) by mouth 2 (two) times daily.  60 tablet  6  . cetirizine (ZYRTEC) 10 MG tablet Take 10 mg by mouth daily.      . cilostazol (PLETAL) 100 MG tablet Take 100 mg by mouth 2 (two) times daily.       . clopidogrel (PLAVIX) 75 MG tablet Take 1 tablet (75 mg total) by mouth daily.  30 tablet  1  . furosemide (LASIX) 20 MG tablet Take 1 tablet (20 mg total) by mouth 2 (two) times daily.  60 tablet  6  . insulin aspart protamine- aspart (NOVOLOG MIX 70/30) (70-30) 100 UNIT/ML injection Sliding scale. Inject 4-16 units before heaviest meal or as needed.      . insulin glargine (LANTUS) 100 UNIT/ML injection Inject 28 Units into the skin daily. Increase 2 units per day for blood sugar greater than 150.      . lansoprazole (PREVACID) 30 MG capsule Take 30 mg by mouth daily.       Marland Kitchen lisinopril (PRINIVIL,ZESTRIL) 40 MG tablet Take 40 mg  by mouth daily.       . metFORMIN (GLUCOPHAGE) 1000 MG tablet Take 1 tablet (1,000 mg total) by mouth 2 (two) times daily with a meal.  60 tablet  1  . pravastatin (PRAVACHOL) 40 MG tablet Take 40 mg by mouth daily.       Marland Kitchen NITROSTAT 0.4 MG SL tablet Place 0.4 mg under the tongue every 5 (five) minutes as needed for chest pain.        No current facility-administered medications for this visit.    Allergies  Allergen Reactions  . Chlorhexidine Rash    History   Social History  . Marital Status:  Married    Spouse Name: N/A    Number of Children: N/A  . Years of Education: N/A   Occupational History  . Not on file.   Social History Main Topics  . Smoking status: Former Smoker -- 1.00 packs/day for 55 years    Types: Cigarettes    Quit date: 05/05/2013  . Smokeless tobacco: Never Used     Comment: 07/07/2013 now using E- Cig.  . Alcohol Use: Yes     Comment: 07/07/2013 "I'll have a beer once in a blue moon"  . Drug Use: No  . Sexual Activity: Not Currently   Other Topics Concern  . Not on file   Social History Narrative  . No narrative on file     Review of Systems: General: negative for chills, fever, night sweats or weight changes.  Cardiovascular: negative for chest pain, dyspnea on exertion, edema, orthopnea, palpitations, paroxysmal nocturnal dyspnea or shortness of breath Dermatological: negative for rash Respiratory: negative for cough or wheezing Urologic: negative for hematuria Abdominal: negative for nausea, vomiting, diarrhea, bright red blood per rectum, melena, or hematemesis Neurologic: negative for visual changes, syncope, or dizziness All other systems reviewed and are otherwise negative except as noted above.    Blood pressure 146/72, pulse 68, height 5' 9.5" (1.765 m), weight 205 lb (92.987 kg).  General appearance: alert and no distress Neck: no adenopathy, no carotid bruit, no JVD, supple, symmetrical, trachea midline and thyroid not enlarged, symmetric, no tenderness/mass/nodules Lungs: clear to auscultation bilaterally Heart: regular rate and rhythm, S1, S2 normal, no murmur, click, rub or gallop Extremities: extremities normal, atraumatic, no cyanosis or edema  EKG not performed today  ASSESSMENT AND PLAN:   Cardiomyopathy, ischemic Status post coronary bypass grafting x4 approximately 3 months ago. That time his EF was 35-40%. He had recent admission for congestive heart failure. 2-D echo revealed EF of 20-25%  And a Myoview stress test  showed inferior scar. He is currently wearing a life vest. The plan is to perform cardiac catheterization to define his anatomy and make sure his grafts are patent and if so position him to an implantable ICD.      Dennis Gess MD FACP,FACC,FAHA, [facility] 08/20/2013 4:07 PM

## 2013-08-26 ENCOUNTER — Telehealth: Payer: Self-pay | Admitting: Cardiology

## 2013-08-26 DIAGNOSIS — I25810 Atherosclerosis of coronary artery bypass graft(s) without angina pectoris: Secondary | ICD-10-CM | POA: Insufficient documentation

## 2013-08-26 DIAGNOSIS — R9430 Abnormal result of cardiovascular function study, unspecified: Secondary | ICD-10-CM | POA: Insufficient documentation

## 2013-08-26 DIAGNOSIS — R931 Abnormal findings on diagnostic imaging of heart and coronary circulation: Secondary | ICD-10-CM | POA: Diagnosis present

## 2013-08-26 DIAGNOSIS — I2510 Atherosclerotic heart disease of native coronary artery without angina pectoris: Secondary | ICD-10-CM

## 2013-08-26 DIAGNOSIS — R9439 Abnormal result of other cardiovascular function study: Secondary | ICD-10-CM | POA: Insufficient documentation

## 2013-08-26 DIAGNOSIS — I2589 Other forms of chronic ischemic heart disease: Secondary | ICD-10-CM

## 2013-08-26 LAB — CBC
HCT: 32.8 % — ABNORMAL LOW (ref 39.0–52.0)
Hemoglobin: 10.5 g/dL — ABNORMAL LOW (ref 13.0–17.0)
Platelets: 248 10*3/uL (ref 150–400)
RBC: 4.14 MIL/uL — ABNORMAL LOW (ref 4.22–5.81)
WBC: 6.2 10*3/uL (ref 4.0–10.5)

## 2013-08-26 LAB — BASIC METABOLIC PANEL
BUN: 14 mg/dL (ref 6–23)
CO2: 26 mEq/L (ref 19–32)
Chloride: 102 mEq/L (ref 96–112)
Glucose, Bld: 285 mg/dL — ABNORMAL HIGH (ref 70–99)
Potassium: 4.2 mEq/L (ref 3.5–5.1)
Sodium: 135 mEq/L (ref 135–145)

## 2013-08-26 MED ORDER — PANTOPRAZOLE SODIUM 40 MG PO TBEC: 40.0000 mg | DELAYED_RELEASE_TABLET | Freq: Every day | ORAL | Status: DC

## 2013-08-26 MED ORDER — LIVING WELL WITH DIABETES BOOK: 1.0000 | Freq: Once | Status: DC

## 2013-08-26 MED ORDER — LIVING WELL WITH DIABETES BOOK
Freq: Once | Status: DC
  Filled 2013-08-26: qty 1

## 2013-08-26 MED ORDER — ACETAMINOPHEN 325 MG PO TABS: 650.0000 mg | ORAL_TABLET | ORAL | Status: DC | PRN

## 2013-08-26 MED ORDER — METFORMIN HCL 1000 MG PO TABS: 1000.0000 mg | ORAL_TABLET | Freq: Two times a day (BID) | ORAL | Status: DC

## 2013-08-26 MED FILL — Sodium Chloride IV Soln 0.9%: INTRAVENOUS | Qty: 50 | Status: AC

## 2013-08-26 NOTE — Telephone Encounter (Signed)
I spoke with Dr [name]. He would prefer Protonix over Prevacid secondary to possible interaction. I left a message with the pt and called in a new Rx.  Coty Larsh PA-C 08/26/2013 1:26 PM

## 2013-08-26 NOTE — Progress Notes (Signed)
Subjective:  No complaints, anxious for discharge.  Objective:  Vital Signs in the last 24 hours: Temp:  [97.4 F (36.3 C)-98.5 F (36.9 C)] 98.5 F (36.9 C) (12/10 0814) Pulse Rate:  [86-107] 98 (12/10 0814) Resp:  [15-18] 18 (12/10 0814) BP: (102-140)/(43-95) 133/82 mmHg (12/10 0814) SpO2:  [93 %-100 %] 96 % (12/10 0814) Weight:  [195 lb 8.8 oz (88.7 kg)] 195 lb 8.8 oz (88.7 kg) (12/10 0001)  Intake/Output from previous day:  Intake/Output Summary (Last 24 hours) at 08/26/13 0900 Last data filed at 08/26/13 0500  Gross per 24 hour  Intake    930 ml  Output   1050 ml  Net   -120 ml    Physical Exam: General appearance: alert, cooperative and no distress Lungs: clear to auscultation bilaterally Heart: regular rate and rhythm Extremities: Rt groin without hematoma   Rate: 80  Rhythm: normal sinus rhythm and PVC  Lab Results:  Recent Labs  08/26/13 0528  WBC 6.2  HGB 10.5*  PLT 248    Recent Labs  08/26/13 0528  NA 135  K 4.2  CL 102  CO2 26  GLUCOSE 285*  BUN 14  CREATININE 1.01   No results found for this basename: TROPONINI, CK, MB,  in the last 72 hours No results found for this basename: INR,  in the last 72 hours  Imaging: Imaging results have been reviewed  Cardiac Studies:  Assessment/Plan:   Principal Problem:   CAD of artery bypass graft- SVG-PDA DES 08/25/13 Active Problems:   Abnormal nuclear cardiac imaging test 08/12/13   Type 2 diabetes mellitus not at goal   CAD- s/p CABG X 4 05/12/13   Ischemic cardiomyopathy- EF 20-25% by 2D 11/14   Essential hypertension   Hyperlipidemia   Tobacco abuse   PVD - bilat SFA disease - s/p PCTA of right SFA 06/30/13; staged PCI to prox & mid third of Lt SFA Turbohawk arthrectomy 07/07/13    PLAN: He has now been revascularized and will need to continue Life Vest and have an echo in 3 months to determine if he needs an ICD. 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 He  apparently is scheduled to see an endocrinologist at [facility]. 2 week follow up with Boyce Medici, then Dr [name] after his follow up echo unless his EF is less than 35%, then he should see EP.    Corine Shelter PA-C Beeper [phone] 08/26/2013, 9:00 AM   I have seen and examined the patient along with Corine Shelter, PA.  I have reviewed the chart, notes and new data.  I agree with PA's note.   PLAN: No access ite complications. Plan Life Vest for 90 days, repeat evaluation of LVEF before decision re: ICD. I have already talked to him about this in the office.  Thurmon Fair, MD, [facility] [phone] 08/26/2013, 11:07 AM

## 2013-08-26 NOTE — Progress Notes (Signed)
CARDIAC REHAB PHASE I   Pt walked 800 ft independently this am. Sts he feels much better, no nausea like he has been having when he stood up. Discussed ed. Pt struggles to comply with heart healthy living. Sts his family will have to make him eat right because he is unable to do so. Has not been exercising although sts he is now interested in doing CRPII with the hopes that it will make his heart stronger. Will refer to [facility]. Pt is smoking the E-cigarette with 16% nicotine solution. Counseled on the dangers of the e-cigarette however he is enjoying it and is not thinking of quitting or weaning down at this time. Pt needs much more reinforcement on risk factor modification. [id number]  Harriet Masson CES, ACSM 08/26/2013 9:08 AM

## 2013-08-26 NOTE — Discharge Summary (Signed)
Patient ID: Dennis [name],  MRN: [id number], DOB/AGE: 68-Feb-1947 68 y.o.  Admit date: 08/25/2013 Discharge date: 08/26/2013  Primary Care Provider: Stanford Scotland, MD  Primary Cardiologist: Dr [name]  Discharge Diagnoses Principal Problem:   CAD of artery bypass graft- SVG-PDA DES 08/25/13 Active Problems:   Abnormal nuclear cardiac imaging test 08/12/13   Type 2 diabetes mellitus not at goal   CAD- s/p CABG X 4 05/12/13   Ischemic cardiomyopathy- EF 20-25% by 2D 11/14   Essential hypertension   Hyperlipidemia   Tobacco abuse   PVD - bilat SFA disease - s/p PCTA of right SFA 06/30/13; staged PCI to prox & mid third of Lt SFA Turbohawk arthrectomy 07/07/13    Procedures:  SVG-PDA DES 08/25/13   Hospital Course:  [name] is a 67 year old who was referred by [facility] for evaluation of claudication in July 2014.  OP LE Doppler studies were abnormal. He had multiple cardiac risk factors including type 2 diabetes, hypertension, and hyperlipidemia. He has a 50-100-pack-year history of tobacco abuse currently smoking one pack to 2 packs a day. He had a positive Myoview stress test followed by a diagnostic coronary arteriogram. Which showed 3 vessel disease with moderate LV dysfunction (35-40%). He underwent coronary bypass grafting x4 by Dr. [name] on 05/12/13 with a sequential LIMA to the LAD and Dx, an SVG to OM and an SVG to the PDA.  Following his coronary artery bypass graft surgery he underwent staged bilateral SFA directional atherectomy with marked improvement in his claudication and arterial Doppler studies. He had an episode of shortness of breath in November and was seen in the ER and diagnosed with congestive heart failure. He was treated with Lasix and referred back to [place] for further evaluation. He saw [name] in our office who ordered a 2-D echocardiogram that revealed an EF of 20-25%. This was new compared to his EF at CABG which was 35-40%. He was prescribed  a LifeVest.  A Myoview stress test showed inferior scar. The pt was admitted for diagnostic cath 08/25/13. This revealed SVG-PDA disease and he underwent PCI with DES. He has now been revascularized and will need to continue his Life Vest and have an echo in 3 months to determine if he needs an ICD. 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 He apparently is scheduled to see an endocrinologist at [facility] for his diabetes.  He will have a 2 week follow up with Boyce Medici, then Dr [name] after his follow up echo - unless his EF is less than 35%, then he should see Dr Royann Shivers.       Discharge Vitals:  Blood pressure 133/82, pulse 98, temperature 98.5 F (36.9 C), temperature source Oral, resp. rate 18, height 5\' 9"  (1.753 m), weight 195 lb 8.8 oz (88.7 kg), SpO2 96.00%.    Labs: Results for orders placed during the hospital encounter of 08/25/13 (from the past 48 hour(s))  GLUCOSE, CAPILLARY     Status: Abnormal   Collection Time    08/25/13  6:37 AM      Result Value Range   Glucose-Capillary 241 (*) 70 - 99 mg/dL  POCT ACTIVATED CLOTTING TIME     Status: None   Collection Time    08/25/13  8:15 AM      Result Value Range   Activated Clotting Time 288    GLUCOSE, CAPILLARY     Status: Abnormal   Collection Time  08/25/13  9:06 AM      Result Value Range   Glucose-Capillary 249 (*) 70 - 99 mg/dL  GLUCOSE, CAPILLARY     Status: Abnormal   Collection Time    08/25/13  5:29 PM      Result Value Range   Glucose-Capillary 329 (*) 70 - 99 mg/dL   Comment 1 Notify RN    GLUCOSE, CAPILLARY     Status: Abnormal   Collection Time    08/25/13  9:00 PM      Result Value Range   Glucose-Capillary 227 (*) 70 - 99 mg/dL  CBC     Status: Abnormal   Collection Time    08/26/13  5:28 AM      Result Value Range   WBC 6.2  4.0 - 10.5 K/uL   RBC 4.14 (*) 4.22 - 5.81 MIL/uL   Hemoglobin 10.5 (*) 13.0 - 17.0 g/dL   HCT 16.1 (*) 09.6 - 04.5 %   MCV 79.2  78.0 -  100.0 fL   MCH 25.4 (*) 26.0 - 34.0 pg   MCHC 32.0  30.0 - 36.0 g/dL   RDW 40.9  81.1 - 91.4 %   Platelets 248  150 - 400 K/uL  BASIC METABOLIC PANEL     Status: Abnormal   Collection Time    08/26/13  5:28 AM      Result Value Range   Sodium 135  135 - 145 mEq/L   Potassium 4.2  3.5 - 5.1 mEq/L   Chloride 102  96 - 112 mEq/L   CO2 26  19 - 32 mEq/L   Glucose, Bld 285 (*) 70 - 99 mg/dL   BUN 14  6 - 23 mg/dL   Creatinine, Ser 7.82  0.50 - 1.35 mg/dL   Calcium 9.1  8.4 - 95.6 mg/dL   GFR calc non Af Amer 74 (*) >90 mL/min   GFR calc Af Amer 86 (*) >90 mL/min   Comment: (NOTE)     The eGFR has been calculated using the CKD EPI equation.     This calculation has not been validated in all clinical situations.     eGFR's persistently <90 mL/min signify possible Chronic Kidney     Disease.  GLUCOSE, CAPILLARY     Status: Abnormal   Collection Time    08/26/13  8:13 AM      Result Value Range   Glucose-Capillary 335 (*) 70 - 99 mg/dL    Disposition:  Follow-up Information   Follow up with SIMMONS, BRITTAINY, PA-C. (office will call you)    Specialty:  Cardiology   Contact information:   [address] [place] [address] [phone]       Discharge Medications:    Medication List    STOP taking these medications       cilostazol 100 MG tablet  Commonly known as:  PLETAL      TAKE these medications       acetaminophen 325 MG tablet  Commonly known as:  TYLENOL  Take 2 tablets (650 mg total) by mouth every 4 (four) hours as needed for headache or mild pain.     aspirin EC 81 MG tablet  Take 1 tablet (81 mg total) by mouth daily.     carvedilol 6.25 MG tablet  Commonly known as:  COREG  Take 1 tablet (6.25 mg total) by mouth 2 (two) times daily.     cetirizine 10 MG tablet  Commonly known as:  ZYRTEC  Take 10 mg by mouth daily.     clopidogrel 75 MG tablet  Commonly known as:  PLAVIX  Take 1 tablet (75 mg total) by mouth daily.      furosemide 20 MG tablet  Commonly known as:  LASIX  Take 1 tablet (20 mg total) by mouth 2 (two) times daily.     insulin glargine 100 units/mL Soln  Commonly known as:  LANTUS  Inject 20 Units into the skin See admin instructions. Start with 20 units once daily and increase by 2 units daily until blood sugar is < or = 150     lansoprazole 30 MG capsule  Commonly known as:  PREVACID  Take 30 mg by mouth daily.     lisinopril 40 MG tablet  Commonly known as:  PRINIVIL,ZESTRIL  Take 40 mg by mouth daily.     metFORMIN 1000 MG tablet  Commonly known as:  GLUCOPHAGE  Take 1 tablet (1,000 mg total) by mouth 2 (two) times daily with a meal.  Start taking on:  08/28/2013     NITROSTAT 0.4 MG SL tablet  Generic drug:  nitroGLYCERIN  Place 0.4 mg under the tongue every 5 (five) minutes as needed for chest pain.     NOVOLOG FLEXPEN 100 UNIT/ML Sopn FlexPen  Generic drug:  insulin aspart  - Inject 4-20 Units into the skin 3 (three) times daily with meals. Per SSI.    - Blood sugar <200= no insulin; 200-250= 4 units; 251-300= 8 units; 301-350= 12 units; 351-400= 14 units; 401-450= 16 units; > or equal to 451= 20 units     pravastatin 40 MG tablet  Commonly known as:  PRAVACHOL  Take 40 mg by mouth daily.         Duration of Discharge Encounter: Greater than 30 minutes including physician time.  Jolene Provost PA-C 08/26/2013 11:11 AM

## 2013-08-31 ENCOUNTER — Encounter: Payer: Self-pay | Admitting: Surgery

## 2013-09-04 ENCOUNTER — Encounter: Payer: Self-pay | Admitting: Cardiology

## 2013-09-04 ENCOUNTER — Ambulatory Visit (INDEPENDENT_AMBULATORY_CARE_PROVIDER_SITE_OTHER): Payer: Managed Care, Other (non HMO) | Admitting: Cardiology

## 2013-09-04 VITALS — BP 130/80 | HR 94 | Ht 69.0 in | Wt 201.0 lb

## 2013-09-04 DIAGNOSIS — I25810 Atherosclerosis of coronary artery bypass graft(s) without angina pectoris: Secondary | ICD-10-CM

## 2013-09-04 DIAGNOSIS — I2589 Other forms of chronic ischemic heart disease: Secondary | ICD-10-CM

## 2013-09-04 DIAGNOSIS — E119 Type 2 diabetes mellitus without complications: Secondary | ICD-10-CM

## 2013-09-04 DIAGNOSIS — I255 Ischemic cardiomyopathy: Secondary | ICD-10-CM

## 2013-09-04 DIAGNOSIS — I2510 Atherosclerotic heart disease of native coronary artery without angina pectoris: Secondary | ICD-10-CM

## 2013-09-04 MED ORDER — FUROSEMIDE 20 MG PO TABS: 20.0000 mg | ORAL_TABLET | Freq: Two times a day (BID) | ORAL | Status: DC

## 2013-09-04 MED ORDER — CARVEDILOL 12.5 MG PO TABS: 12.5000 mg | ORAL_TABLET | Freq: Two times a day (BID) | ORAL | Status: DC

## 2013-09-04 NOTE — Assessment & Plan Note (Addendum)
H/o CABG x 4 in Aug. 2014. S/p recent LHC w/ PCI + DES to SVG-PDA. All other grafts were patent. Denies any chest pain. EKG w/o any ischemic changes. On ASA+ Plavix. Also on BB, ACE-I and statin. Reports daily compliance with meds.

## 2013-09-04 NOTE — Assessment & Plan Note (Signed)
Recently started on Insulin by his PCP for better glucose control

## 2013-09-04 NOTE — Assessment & Plan Note (Addendum)
Has Lifevest. Reports daily compliance. On low dose BB. 6.25 mg of Coreg BID. HR and BP both stable. Will increase to 12.5 mg BID with goal of optimizing to 25 mg BID. He is on maximum dose of an ACE-I, 40 mg Lisinopril daily. Today in clinic, he is w/o any signs of fluid retention (No JVD or LEE. Lungs are CTAB). HF education was provided on the importance of medication adherence, daily weights and low sodium diet. He reports his dry weight is 192 lbs. Directions were given to increase Lasix by 1 additional tablet (20 mg) if his weight increases 2 lbs over a 24 hour period. Will continue on maintainance dose of 20 mg BID. A long discussion was held over driving restrictions while on LiveVest. He was told absolutely no driving. We will have him return to the office in 3 weeks for f/u and medication titration. His Coreg will need to be increased to 25 mg BID, if BP and HR allows. His f/u 2D echo is already ordered to be completed ~11/06/13, to determine if an ICD is waranted.

## 2013-09-04 NOTE — Patient Instructions (Signed)
Increase Coreg to 12.5 mg twice a day. Continue to weigh your self every day. Take Lasix daily. Take any extra tablet if you gain more than 2 lbs in one day. Continue low sodium diet. Continue to wear your lifevest everyday. Absolutely no driving. Return in 1 month for f/u visit and medication adjustment.

## 2013-09-04 NOTE — Progress Notes (Signed)
Patient ID: Edder Bellanca, male   DOB: 1946/04/02, 67 y.o.   MRN: [id number] 09/04/2013 Malaki Koury   03/29/1946  [id number]  Primary Physicia Stanford Scotland, MD Primary Cardiologist: Dr. [name]  HPI:  [name] is a 67 year old who was referred by [facility] for evaluation of claudication in July 2014. OP LE Doppler studies were abnormal. He had multiple cardiac risk factors including type 2 diabetes, hypertension, and hyperlipidemia. He has a 50-100-pack-year history of tobacco abuse currently smoking one pack to 2 packs a day. He had a positive Myoview stress test followed by a diagnostic coronary arteriogram. Which showed 3 vessel disease with moderate LV dysfunction (35-40%). He underwent coronary bypass grafting x4 by Dr. [name] on 05/12/13 with a sequential LIMA to the LAD and Dx, an SVG to OM and an SVG to the PDA. Following his coronary artery bypass graft surgery he underwent staged bilateral SFA directional atherectomy with marked improvement in his claudication and arterial Doppler studies. He had an episode of shortness of breath in November and was seen in the ER and diagnosed with congestive heart failure. He was treated with Lasix and referred back to [place] for further evaluation. He then saw me in our office and I ordered a 2-D echocardiogram that revealed an EF of 20-25%. This was new compared to his EF at CABG which was 35-40%. He was prescribed a LifeVest. A Myoview stress test showed inferior scar. The pt was admitted for diagnostic cath 08/25/13. This revealed SVG-PDA disease and he underwent PCI with DES. All of his other grafts were patent. He has now been revascularized and will need to continue his Life Vest and have an echo in 3 months to determine if he needs an ICD. 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 He apparently is scheduled to see an endocrinologist at [facility] for his diabetes.  He returns to clinic for post-hospital follow-up, following  the stenting of his SVG-PDA. Since that time, he has denied any chest pain. He has had some occasional orthopnea, but this has improved with increasing his Lasix dose PRN. He continues daily weights and has been fairly compliant with his low sodium restriction. He reports daily compliance with his medications and with his LifeVest. 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     Current Outpatient Prescriptions  Medication Sig Dispense Refill  . acetaminophen (TYLENOL) 325 MG tablet Take 2 tablets (650 mg total) by mouth every 4 (four) hours as needed for headache or mild pain.      Marland Kitchen aspirin EC 81 MG tablet Take 1 tablet (81 mg total) by mouth daily.  90 tablet  3  . carvedilol (COREG) 12.5 MG tablet Take 1 tablet (12.5 mg total) by mouth 2 (two) times daily.  60 tablet  6  . cetirizine (ZYRTEC) 10 MG tablet Take 10 mg by mouth daily.      . clopidogrel (PLAVIX) 75 MG tablet Take 1 tablet (75 mg total) by mouth daily.  30 tablet  1  . furosemide (LASIX) 20 MG tablet Take 1 tablet (20 mg total) by mouth 2 (two) times daily.  75 tablet  6  . insulin aspart (NOVOLOG FLEXPEN) 100 UNIT/ML SOPN FlexPen Inject 4-20 Units into the skin 3 (three) times daily with meals. Per SSI.   Blood sugar <200= no insulin; 200-250= 4 units; 251-300= 8 units; 301-350=  12 units; 351-400= 14 units; 401-450= 16 units; > or equal to 451= 20 units      . insulin glargine (LANTUS) 100 units/mL SOLN Inject 34 Units into the skin See admin instructions. Start with 20 units once daily and increase by 2 units daily until blood sugar is < or = 150      . lisinopril (PRINIVIL,ZESTRIL) 40 MG tablet Take 40 mg by mouth daily.       Marland Kitchen living well with diabetes book MISC 1 each by Does not apply route once.      . metFORMIN (GLUCOPHAGE) 1000 MG tablet Take 1 tablet (1,000 mg total) by mouth 2 (two) times daily with a meal.  60 tablet  1  . NITROSTAT 0.4 MG SL tablet  Place 0.4 mg under the tongue every 5 (five) minutes as needed for chest pain.       . pantoprazole (PROTONIX) 40 MG tablet Take 1 tablet (40 mg total) by mouth daily.  30 tablet  11  . pravastatin (PRAVACHOL) 40 MG tablet Take 40 mg by mouth daily.        No current facility-administered medications for this visit.    Allergies  Allergen Reactions  . Chlorhexidine Rash    History   Social History  . Marital Status: Married    Spouse Name: N/A    Number of Children: N/A  . Years of Education: N/A   Occupational History  . Not on file.   Social History Main Topics  . Smoking status: Former Smoker -- 1.00 packs/day for 55 years    Types: Cigarettes    Quit date: 05/05/2013  . Smokeless tobacco: Never Used     Comment: 07/07/2013 now using E- Cig.  . Alcohol Use: No     Comment: 07/07/2013 "I'll have a beer once in a blue moon"  . Drug Use: No  . Sexual Activity: Not Currently   Other Topics Concern  . Not on file   Social History Narrative  . No narrative on file     Review of Systems: General: negative for chills, fever, night sweats or weight changes.  Cardiovascular: negative for chest pain, dyspnea on exertion, edema, orthopnea, palpitations, paroxysmal nocturnal dyspnea or shortness of breath Dermatological: negative for rash Respiratory: negative for cough or wheezing Urologic: negative for hematuria Abdominal: negative for nausea, vomiting, diarrhea, bright red blood per rectum, melena, or hematemesis Neurologic: negative for visual changes, syncope, or dizziness All other systems reviewed and are otherwise negative except as noted above.    Blood pressure 130/80, pulse 94, height 5\' 9"  (1.753 m), weight 201 lb (91.173 kg).  General appearance: alert, cooperative, no distress and wearing LifeVest Neck: no carotid bruit and no JVD Lungs: clear to auscultation bilaterally Heart: regular rate and rhythm, S1, S2 normal, no murmur, click, rub or  gallop Abdomen: soft, non-tender; bowel sounds normal; no masses,  no organomegaly Extremities: no LEE Pulses: 2+ and symmetric Skin: warm and dry Neurologic: Grossly normal  EKG NSR. HR: 94 bmp. No ischemic changes  ASSESSMENT AND PLAN:   CAD of artery bypass graft- SVG-PDA DES 08/25/13 H/o CABG x 4 in Aug. 2014. S/p recent LHC w/ PCI + DES to SVG-PDA. All other grafts were patent. Denies any chest pain. EKG w/o any ischemic changes. On ASA+ Plavix. Also on BB, ACE-I and statin. Reports daily compliance with meds.   Ischemic cardiomyopathy- EF 20-25% by 2D 11/14 Has Lifevest. Reports daily compliance. On low dose BB. 6.25  mg of Coreg BID. HR and BP both stable. Will increase to 12.5 mg BID with goal of optimizing to 25 mg BID. He is on maximum dose of an ACE-I, 40 mg Lisinopril daily. Today in clinic, he is w/o any signs of fluid retention (No JVD or LEE. Lungs are CTAB). HF education was provided on the importance of medication adherence, daily weights and low sodium diet. He reports his dry weight is 192 lbs. Directions were given to increase Lasix by 1 additional tablet (20 mg) if his weight increases 2 lbs over a 24 hour period. Will continue on maintainance dose of 20 mg BID. A long discussion was held over driving restrictions while on LiveVest. He was told absolutely no driving. We will have him return to the office in 3 weeks for f/u and medication titration. His Coreg will need to be increased to 25 mg BID, if BP and HR allows. His f/u 2D echo is already ordered to be completed ~11/06/13, to determine if an ICD is waranted.   Type 2 diabetes mellitus not at goal Recently started on Insulin by his PCP for better glucose control     PLAN  67 y/o male with recent CABG x 4, several months ago, newly diagnosed ischemic cardiomyopathy w/ EF of 20% and recent PCI + DES to SVG-PDA 2 weeks ago, returns to clinic for post-hospital f/u. He has been w/o any chest pain and EKG shows NSR w/o any  ischemic changes. We will continue him on ASA, Plavix and statin. Will also resume him on his BB and ACE-I for his cardiomyopathy. He is on the maximum dose of Lisinopril at 40 mg daily. I have elected to increase his Coreg to 12.5 mg BID. His BP and HR were both stable during today's visit and there appears to be plenty of room for titration. He will also continue on Lasix, 40 mg daily for fluid control. I stressed continued daily weights and low sodium diet. We will continue him on his LifeVest for prevention of SCD. He has been compliant with it, but he was told absolutely no driving until cleared by Dr. [name]. He voiced understanding. We will have him return to clinic in 3 weeks for further titration of his BB, to maximize therapy, in an effort to improve systolic function. His f/u 2D echo is already ordered to be completed ~11/06/13, to reassess LV function and to determine if an ICD is warranted.   Dineen Kid 09/04/2013 6:32 PM

## 2013-09-08 ENCOUNTER — Ambulatory Visit: Payer: Managed Care, Other (non HMO) | Admitting: Cardiology

## 2013-10-12 ENCOUNTER — Ambulatory Visit (INDEPENDENT_AMBULATORY_CARE_PROVIDER_SITE_OTHER): Payer: Managed Care, Other (non HMO) | Admitting: Cardiology

## 2013-10-12 VITALS — BP 110/70 | HR 80 | Ht 69.0 in | Wt 201.0 lb

## 2013-10-12 DIAGNOSIS — I2510 Atherosclerotic heart disease of native coronary artery without angina pectoris: Secondary | ICD-10-CM

## 2013-10-12 DIAGNOSIS — I2589 Other forms of chronic ischemic heart disease: Secondary | ICD-10-CM

## 2013-10-12 DIAGNOSIS — I255 Ischemic cardiomyopathy: Secondary | ICD-10-CM

## 2013-10-12 MED ORDER — CARVEDILOL 25 MG PO TABS: 25.0000 mg | ORAL_TABLET | Freq: Two times a day (BID) | ORAL | Status: DC

## 2013-10-12 MED ORDER — CARVEDILOL 25 MG PO TABS: 12.5000 mg | ORAL_TABLET | Freq: Two times a day (BID) | ORAL | Status: DC

## 2013-10-12 NOTE — Patient Instructions (Signed)
Increase Coreg to 25 mg twice a day. Continue taking Lisinopril 40 mg daily. Continue all other meds. Return in 1 month for ultrasound of heart and follow-up with Dr. Allyson SabalBerry.

## 2013-10-12 NOTE — Progress Notes (Signed)
Patient ID: Dennis [name], male   DOB: 02-28-1946, 68 y.o.   MRN: [id number]  10/14/2013 Dennis [name]   10/02/1945  [id number]  Primary Physicia Dennis Scotland, MD Primary Cardiologist: Dr. Allyson [name]  HPI:  The patient is a 68 y/o male, followed by Dr. Allyson [name], with a history of CAD, s/p CABG x 4 on 05/12/13 with a sequential LIMA to the LAD and diagonal branch, SVG to obtuse marginal branch and to the PDA. In December 2014, he had an acute systolic heart failure exacerbation, which led to a 2D echo, which revealed severely reduced systolic function with an EF of 20-25%. Subsequently, he was prescribed a LifeVest and underwent a Myoview NST which showed an inferior scar. Dr. Allyson [name] then recommended a LHC to redefine his coronary anatomy. He underwent a LHC by Dr. Allyson [name] on August 25, 2013 which demonstrated mid SVG-PDA stenosis. This was successfully treated with PCI utilizing a DES. All other grafts were patent. His other history is significant for poorly controlled DM, HTN, HLD and PVD. He recently quit smoking in August 2014 following CABG.   He presents to clinic today for medication titration for his ischemic cardiomyopathy. He reports that he has been doing well since his last office visit. He denies any chest pain, SOB, orthopnea, PND/LEE. He has tolerated his last increase in his BB w/o difficulty. He denies feeling week, fatigued, dizzy, lightheaded. No syncope/near syncope. He reports daily compliance with his LifeVest. He denies any shocks. He has been fully compliant with his home medications. His PCP recently made medication adjustments to his oral hypoglycemics for better control of his diabetes.     Current Outpatient Prescriptions  Medication Sig Dispense Refill  . acetaminophen (TYLENOL) 325 MG tablet Take 2 tablets (650 mg total) by mouth every 4 (four) hours as needed for headache or mild pain.      Marland Kitchen aspirin EC 81 MG tablet Take 1 tablet (81 mg total) by mouth daily.  90 tablet  3  .  carvedilol (COREG) 25 MG tablet Take 1 tablet (25 mg total) by mouth 2 (two) times daily.  60 tablet  6  . cetirizine (ZYRTEC) 10 MG tablet Take 10 mg by mouth daily.      . clopidogrel (PLAVIX) 75 MG tablet Take 1 tablet (75 mg total) by mouth daily.  30 tablet  1  . furosemide (LASIX) 20 MG tablet Take 1 tablet (20 mg total) by mouth 2 (two) times daily.  75 tablet  6  . glipiZIDE (GLUCOTROL XL) 10 MG 24 hr tablet 10 mg.      . lisinopril (PRINIVIL,ZESTRIL) 40 MG tablet Take 40 mg by mouth daily.       Marland Kitchen living well with diabetes book MISC 1 each by Does not apply route once.      . metFORMIN (GLUCOPHAGE) 1000 MG tablet Take 1 tablet (1,000 mg total) by mouth 2 (two) times daily with a meal.  60 tablet  1  . NITROSTAT 0.4 MG SL tablet Place 0.4 mg under the tongue every 5 (five) minutes as needed for chest pain.       . pravastatin (PRAVACHOL) 40 MG tablet Take 40 mg by mouth daily.       . pantoprazole (PROTONIX) 40 MG tablet Take 1 tablet (40 mg total) by mouth daily.  30 tablet  11   No current facility-administered medications for this visit.    Allergies  Allergen Reactions  . Chlorhexidine Rash    History  Social History  . Marital Status: Married    Spouse Name: N/A    Number of Children: N/A  . Years of Education: N/A   Occupational History  . Not on file.   Social History Main Topics  . Smoking status: Former Smoker -- 1.00 packs/day for 55 years    Types: Cigarettes    Quit date: 05/05/2013  . Smokeless tobacco: Never Used     Comment: 07/07/2013 now using E- Cig.  . Alcohol Use: No     Comment: 07/07/2013 "I'll have a beer once in a blue moon"  . Drug Use: No  . Sexual Activity: Not Currently   Other Topics Concern  . Not on file   Social History Narrative  . No narrative on file     Review of Systems: General: negative for chills, fever, night sweats or weight changes.  Cardiovascular: negative for chest pain, dyspnea on exertion, edema, orthopnea,  palpitations, paroxysmal nocturnal dyspnea or shortness of breath Dermatological: negative for rash Respiratory: negative for cough or wheezing Urologic: negative for hematuria Abdominal: negative for nausea, vomiting, diarrhea, bright red blood per rectum, melena, or hematemesis Neurologic: negative for visual changes, syncope, or dizziness All other systems reviewed and are otherwise negative except as noted above.    Blood pressure 110/70, pulse 80, height 5\' 9"  (1.753 m), weight 201 lb (91.173 kg).  General appearance: alert, cooperative, no distress and wearing his LifeVest Neck: no carotid bruit and no JVD Lungs: clear to auscultation bilaterally Heart: regular rate and rhythm, S1, S2 normal, no murmur, click, rub or gallop Extremities: no LEE Pulses: 2+ and symmetric Skin: warm and dry Neurologic: Grossly normal    ASSESSMENT AND PLAN:   Ischemic cardiomyopathy- EF 20-25% by 2D 11/14 He is already on the maximum dose of ACE-I. We will resume Lisinopril, 40 mg daily. His BP and HR are both stable and there is room to titrate his BB. We will increase his Coreg dose from 12.5 mg BID to 25 mg BID. Continue with LifeVest for prevention of SCD. Pt reminded no driving. We will have him return in 1 month to repeat 2D echo to reassess systolic function, to see if an ICD is warranted. He will also f/u with Dr. Allyson [name].      PLAN  Titrate BB to maximum dose for ischemic cardiomyopathy. Pt now on 25 mg of Coreg BID. Also will resume maximum dose of ACE-I, Lisinopril 40 mg daily. Resume LifeVest. Return in 1 month for repeat echo to see if an ICD is warranted. F/u with Dr. Allyson [name] in 1 month.   Dennis [name], Dennis [name] 10/14/2013 10:31 AM

## 2013-10-14 ENCOUNTER — Encounter: Payer: Self-pay | Admitting: Cardiology

## 2013-10-14 NOTE — Assessment & Plan Note (Signed)
He is already on the maximum dose of ACE-I. We will resume Lisinopril, 40 mg daily. His BP and HR are both stable and there is room to titrate his BB. We will increase his Coreg dose from 12.5 mg daily to 25 mg daily. Continue with LifeVest for prevention of SCD. Pt reminded no driving. We will have him return in 1 month to repeat 2D echo to reassess systolic function, to see if an ICD is warranted. He will also f/u with Dr. Allyson SabalBerry.

## 2013-10-20 ENCOUNTER — Telehealth: Payer: Self-pay | Admitting: *Deleted

## 2013-10-20 NOTE — Telephone Encounter (Signed)
Returned call.  Left message to call back before 4pm.  

## 2013-10-20 NOTE — Telephone Encounter (Signed)
Pt stated that he feels that his legs are hurting like they were and needed to speak to someone about this. I made an appointment for him for March and he has an appointment with [place] on 2/24.  JB

## 2013-10-21 ENCOUNTER — Telehealth: Payer: Self-pay | Admitting: *Deleted

## 2013-10-21 NOTE — Telephone Encounter (Signed)
Returned call and pt verified x 2.  Pt stated he needs to speak to Dr. Hazle CocaBerry's nurse about his legs.  Pt stated his legs have started hurting real bad.  Stated it just started.  Of note, pt called yesterday with c/o leg pain and was not able to be reached.  Samara DeistKathryn, RN w/ Dr. Allyson SabalBerry notified and advised pt come in tomorrow for first available appt w/ an Extender.  Pt informed and verbalized understanding.  Appt scheduled for tomorrow at 8:30am w/ Robbie LisBrittainy Simmons, PA-C.  DVT symptoms reviewed w/ pt and advised ER if any develop between now and then.  Pt verbalized understanding and agreed w/ plan.

## 2013-10-21 NOTE — Telephone Encounter (Signed)
Pt stated that he was returning his call and he also stated that his legs are hurting him and he doesn't know what to do.  JB

## 2013-10-22 ENCOUNTER — Telehealth: Payer: Self-pay | Admitting: Cardiovascular Disease

## 2013-10-22 ENCOUNTER — Ambulatory Visit (INDEPENDENT_AMBULATORY_CARE_PROVIDER_SITE_OTHER): Payer: Managed Care, Other (non HMO) | Admitting: Cardiology

## 2013-10-22 VITALS — BP 110/72 | HR 70 | Ht 69.0 in | Wt 198.0 lb

## 2013-10-22 DIAGNOSIS — M79604 Pain in right leg: Secondary | ICD-10-CM | POA: Insufficient documentation

## 2013-10-22 DIAGNOSIS — I739 Peripheral vascular disease, unspecified: Secondary | ICD-10-CM

## 2013-10-22 DIAGNOSIS — M79605 Pain in left leg: Secondary | ICD-10-CM

## 2013-10-22 DIAGNOSIS — M79609 Pain in unspecified limb: Secondary | ICD-10-CM

## 2013-10-22 LAB — D-DIMER, QUANTITATIVE (NOT AT ARMC): D DIMER QUANT: 0.48 ug{FEU}/mL (ref 0.00–0.48)

## 2013-10-22 NOTE — Telephone Encounter (Signed)
Pt seen this morning and JC, LPN advised to speak w/ lab for Critical Lab Results.

## 2013-10-22 NOTE — Patient Instructions (Signed)
Return tomorrow for ultrasound of legs Get blood work done today

## 2013-10-22 NOTE — Assessment & Plan Note (Signed)
Differential diagnosis includes PVD, DVT (low probability) and diabetic neuropathy. He has decreased distal pulses bilaterally. Hand held doppler had to be used to assess for pulses. He recently had is BB increased to maximum dose for ischemic cardiomyopathy a week ago. ? If this may be a contributing factor. We will assses degree of arterial patency and ABIs via bilateral arterial duplex ultrasound. I feel that DVT is less likely, however will obtain a D-dimer to rule out. If elevated will proceed with bilateral venous dopplers. It is possible that his pain may also be due to diabetic neuropathy, as he has a history of poorly controlled diabetes. If our work-up returns unremarkable, will have pt f/u with his PCP for further evaluation.

## 2013-10-22 NOTE — Progress Notes (Signed)
Patient ID: Dennis [name], male   DOB: 10-27-1945, 68 y.o.   MRN: [id number]  10/22/2013 Dennis [name]   10-27-1945  [id number]  Primary Physicia Stanford Scotlandavid S Jackson, MD Primary Cardiologist: Dr. Allyson SabalBerry  HPI:  [name] is a 68 y/o male, followed by Dr. Allyson SabalBerry with a history of CAD, s/p CABG x 4 on 05/12/13 with a sequential LIMA to the LAD and diagonal branch, SVG to obtuse marginal branch and to the PDA, s/p PCI to the SVG-PDA in December 2014, ischemic cardiomyopathy with EF of 20-25%. He has a LifeVest for prevention of SCD. He also has PVD. He underwent Turbo Hawk directional atherectomy of his right SFA by Dr. Allyson SabalBerry on 07/01/13. He had an excellent angiographic and clinical result. He presented back on 07/07/13 for staged left SFA intervention. The pt had successful TurboHawk directional arthrectomy using distal protection of a highly diseased proximal and mid third of the left SFA with one-vessel runoff. He had f/u bilateral LEAs in the office, performed 11/17, which demonstrated patent SFAs with a mild amount of residual plaque visualized but not hemodynamically significant. When compared to prior study, there was marked improvement in bilateral ABIs. Right: 0.89 Left 0.85 (prior study in July 2014 showed ABIs of 0.63 on the right and 0.70 on the left). He is on DAPT with ASA + Plavix. He also has h/o of poorly controlled DM, followed by his PCP, HTN and HLD.  He returns to clinic today with a complaint of severe bilateral LEE pain x 1 week. He describes the pain to be very similar to the claudication pain he had prior to undergoing his PV procedures. It is in the distribution of his posterior thighs and calf muscles. Described as a constant dull aching pain. No numbness or tingling. He notes decreased exercise tolerance as a result. He has pain both a rest and will ambulation. His pain is worse with physical activity. The pain does not improve with rest. He reports compliance with ASA and Plavix. He has refrained  from tobacco use. He quit smoking right after his CABG last summer. He states that his DM remains poorly controlled. His PCP recently adjusted his medications. He denies any lower extremity erythema or swelling. No recent falls/trauma/ injury. No recent history of prolonged travel or immobility. He denies CP and SOB.     Current Outpatient Prescriptions  Medication Sig Dispense Refill  . acetaminophen (TYLENOL) 325 MG tablet Take 2 tablets (650 mg total) by mouth every 4 (four) hours as needed for headache or mild pain.      Marland Kitchen. aspirin EC 81 MG tablet Take 1 tablet (81 mg total) by mouth daily.  90 tablet  3  . carvedilol (COREG) 25 MG tablet Take 1 tablet (25 mg total) by mouth 2 (two) times daily.  60 tablet  6  . cetirizine (ZYRTEC) 10 MG tablet Take 10 mg by mouth daily.      . clopidogrel (PLAVIX) 75 MG tablet Take 1 tablet (75 mg total) by mouth daily.  30 tablet  1  . furosemide (LASIX) 20 MG tablet Take 1 tablet (20 mg total) by mouth 2 (two) times daily.  75 tablet  6  . glipiZIDE (GLUCOTROL XL) 10 MG 24 hr tablet 10 mg.      . lisinopril (PRINIVIL,ZESTRIL) 40 MG tablet Take 40 mg by mouth daily.       Marland Kitchen. living well with diabetes book MISC 1 each by Does not apply route once.      .Marland Kitchen  metFORMIN (GLUCOPHAGE) 1000 MG tablet Take 1 tablet (1,000 mg total) by mouth 2 (two) times daily with a meal.  60 tablet  1  . NITROSTAT 0.4 MG SL tablet Place 0.4 mg under the tongue every 5 (five) minutes as needed for chest pain.       . pantoprazole (PROTONIX) 40 MG tablet Take 1 tablet (40 mg total) by mouth daily.  30 tablet  11  . pravastatin (PRAVACHOL) 40 MG tablet Take 40 mg by mouth daily.        No current facility-administered medications for this visit.    Allergies  Allergen Reactions  . Chlorhexidine Rash    History   Social History  . Marital Status: Married    Spouse Name: N/A    Number of Children: N/A  . Years of Education: N/A   Occupational History  . Not on file.    Social History Main Topics  . Smoking status: Former Smoker -- 1.00 packs/day for 55 years    Types: Cigarettes    Quit date: 05/05/2013  . Smokeless tobacco: Never Used     Comment: 07/07/2013 now using E- Cig.  . Alcohol Use: No     Comment: 07/07/2013 "I'll have a beer once in a blue moon"  . Drug Use: No  . Sexual Activity: Not Currently   Other Topics Concern  . Not on file   Social History Narrative  . No narrative on file     Review of Systems: General: negative for chills, fever, night sweats or weight changes.  Cardiovascular: negative for chest pain, dyspnea on exertion, edema, orthopnea, palpitations, paroxysmal nocturnal dyspnea or shortness of breath Dermatological: negative for rash Respiratory: negative for cough or wheezing Urologic: negative for hematuria Abdominal: negative for nausea, vomiting, diarrhea, bright red blood per rectum, melena, or hematemesis Neurologic: negative for visual changes, syncope, or dizziness All other systems reviewed and are otherwise negative except as noted above.    Blood pressure 110/72, pulse 70, height 5\' 9"  (1.753 m), weight 198 lb (89.812 kg).  General appearance: alert, cooperative and no distress Neck: no carotid bruit and no JVD Lungs: clear to auscultation bilaterally Heart: regular rate and rhythm, S1, S2 normal, no murmur, click, rub or gallop Extremities: No LEE, no erthyema, negative homan's bilatearlly Pulses: faint 1+ posterior tibials, faint 1+ DP on the right, no palpable DP on the left but audible with dopplers Skin: distal extremities slight cool to touch Neurologic: Grossly normal   ASSESSMENT AND PLAN:   Bilateral leg pain Differential diagnosis includes PVD, DVT (low probability) and diabetic neuropathy. He has decreased distal pulses bilaterally. Hand held doppler had to be used to assess for pulses. He recently had his BB increased to maximum dose for ischemic cardiomyopathy a week ago. ? If this  may be a contributing factor. We will assses degree of arterial patency and ABIs via bilateral arterial duplex ultrasound. I feel that DVT is less likely, however will obtain a D-dimer to rule out. If elevated will proceed with bilateral venous dopplers. It is possible that his pain may also be due to diabetic neuropathy, as he has a history of poorly controlled diabetes. If our work-up returns unremarkable, will have pt f/u with his PCP for further evaluation.    PLAN  Check D-dimer to r/o DVT. Repeat bilateral LEAs to evaluate vessel patency and degree of PVD. Will have patent f/u with Dr. [name] if LEAs show progression of vascular disease.   SIMMONS, BRITTAINYPA-C 10/22/2013  3:21 PM

## 2013-10-23 ENCOUNTER — Encounter: Payer: Self-pay | Admitting: Cardiovascular Disease

## 2013-10-23 ENCOUNTER — Ambulatory Visit (INDEPENDENT_AMBULATORY_CARE_PROVIDER_SITE_OTHER): Payer: Managed Care, Other (non HMO) | Admitting: Cardiovascular Disease

## 2013-10-23 ENCOUNTER — Ambulatory Visit (HOSPITAL_COMMUNITY)
Admission: RE | Admit: 2013-10-23 | Discharge: 2013-10-23 | Disposition: A | Discharge status: Home / Self Care | Payer: Managed Care, Other (non HMO) | Source: Ambulatory Visit | Attending: Cardiology | Admitting: Cardiology

## 2013-10-23 ENCOUNTER — Encounter: Payer: Self-pay | Admitting: Cardiology

## 2013-10-23 VITALS — BP 102/64 | HR 90 | Ht 69.0 in | Wt 202.0 lb

## 2013-10-23 DIAGNOSIS — I2510 Atherosclerotic heart disease of native coronary artery without angina pectoris: Secondary | ICD-10-CM

## 2013-10-23 DIAGNOSIS — M79605 Pain in left leg: Secondary | ICD-10-CM

## 2013-10-23 DIAGNOSIS — Z79899 Other long term (current) drug therapy: Secondary | ICD-10-CM

## 2013-10-23 DIAGNOSIS — Z01818 Encounter for other preprocedural examination: Secondary | ICD-10-CM

## 2013-10-23 DIAGNOSIS — D689 Coagulation defect, unspecified: Secondary | ICD-10-CM

## 2013-10-23 DIAGNOSIS — M79604 Pain in right leg: Secondary | ICD-10-CM

## 2013-10-23 DIAGNOSIS — I739 Peripheral vascular disease, unspecified: Secondary | ICD-10-CM

## 2013-10-23 DIAGNOSIS — I70219 Atherosclerosis of native arteries of extremities with intermittent claudication, unspecified extremity: Secondary | ICD-10-CM | POA: Insufficient documentation

## 2013-10-23 NOTE — Assessment & Plan Note (Signed)
Patient has CAD status post coronary bypass grafting x48/26 was 14. Because of continued LV dysfunction I re\re cathed him 08/25/13 revealing a high-grade stenosis in the PDA SVG which I stented. He continues where a life vest. He denies chest pain. Plan is to recheck a 2-D echo later this month to determine whether or not he needs to transition to a permanent indwelling ICD

## 2013-10-23 NOTE — Progress Notes (Signed)
Lower Extremity Arterial Duplex Completed. °Brianna L Mazza,RVT °

## 2013-10-23 NOTE — Progress Notes (Signed)
10/23/2013 Dennis ModenaGeorge [name]   04-22-1946  [id number]  Primary Physician Stanford Scotlandavid S Jackson, MD Primary Cardiologist: Runell GessJonathan J. Berry MD Dennis RenoFACP,FACC,FAHA, FSCAI   HPI:  Mr. Dennis MorinBell is a 68 year old married Caucasian male father of one, grandmother, grandfather is accompanied by his wife today. [name] for evaluation of claudication and arterial Doppler studies which were obtained in our office 04/07/13. His cardiovascular risk factors include type 2 diabetes, hypertension, and hyperlipidemia. He has a 50-100-pack-year history of tobacco abuse currently smoking one pack to 2 packs a day. There is no family history of heart disease. He's never had a heart attack or stroke. He does complain of dyspnea on exertion. He has had claudication for the last 2 years worse over the last 3 months which is now lifestyle limiting. Doppler studies in our office performed 04/07/13 revealed a right ABI of 0.63 and a left ABI of 0.70. He had high-grade SFA disease bilaterally as well as tibial disease.because of a positive Myoview stress test the patient first had a diagnostic coronary arteriogram. Which showed 3 vessel disease with moderate LV dysfunction. He'll underwent coronary bypass grafting x4 by Dr. Rexanne ManoBrian [name] on 05/12/13 with a sequential LIMA to the LAD and diagonal branch, vein to obtuse marginal branch and to the PDA. His postop course was uncomplicated. Following his coronary artery bypass graft surgery he underwent staged bilateral superficial femoral artery directional atherectomy with marked improvement in his claudication and arterial Doppler studies. This week he developed shortness of breath and was seen in an emergency room and diagnosed with congestive heart failure. He was treated with Lasix and referred back to us for further evaluation. He saw Dennis MediciBrittany Simmons PA-C in our office yesterday who did a 2-D echocardiogram today that revealed an EF of 20-25%. This is a finding  now almost 3 months status post coronary revascularization.  He is currently wearing a LifeVest. A recent 2-D echo revealed an ejection fraction of 20-25% with global hypokinesia. A Myoview stress test showed inferior scar. [name] underwent cardiac catheterization by myself/5/14 with a high-grade stenosis within his PDA graft was stented. He continues to wear a life vest with a 2-D echo scheduled several weeks now to determine whether he's had improvement in LV function and, whether he can take off his life vest or needs to transition to an ICD. He [facility] Dennis [facility]Peggy [name]  in the office last week . Bilateral lower extremity pain similar to this preintervention symptoms. Followup Dopplers confirmed recurrent disease.   Current Outpatient Prescriptions  Medication Sig Dispense Refill  . acetaminophen (TYLENOL) 325 MG tablet Take 2 tablets (650 mg total) by mouth every 4 (four) hours as needed for headache or mild pain.      Dennis [name]. aspirin EC 81 MG tablet Take 1 tablet (81 mg total) by mouth daily.  90 tablet  3  . carvedilol (COREG) 25 MG tablet Take 1 tablet (25 mg total) by mouth 2 (two) times daily.  60 tablet  6  . cetirizine (ZYRTEC) 10 MG tablet Take 10 mg by mouth daily.      . clopidogrel (PLAVIX) 75 MG tablet Take 1 tablet (75 mg total) by mouth daily.  30 tablet  1  . furosemide (LASIX) 20 MG tablet Take 1 tablet (20 mg total) by mouth 2 (two) times daily.  75 tablet  6  . glipiZIDE (GLUCOTROL XL) 10 MG 24 hr tablet 10 mg.      .Dennis [name]  lisinopril (PRINIVIL,ZESTRIL) 40 MG tablet Take 40 mg by mouth daily.       Dennis [name] living well with diabetes book MISC 1 each by Does not apply route once.      . metFORMIN (GLUCOPHAGE) 1000 MG tablet Take 1 tablet (1,000 mg total) by mouth 2 (two) times daily with a meal.  60 tablet  1  . NITROSTAT 0.4 MG SL tablet Place 0.4 mg under the tongue every 5 (five) minutes as needed for chest pain.       . pantoprazole (PROTONIX) 40 MG tablet Take 1 tablet (40 mg total) by mouth  daily.  30 tablet  11  . pravastatin (PRAVACHOL) 40 MG tablet Take 40 mg by mouth daily.        No current facility-administered medications for this visit.    Allergies  Allergen Reactions  . Chlorhexidine Rash    History   Social History  . Marital Status: Married    Spouse Name: N/A    Number of Children: N/A  . Years of Education: N/A   Occupational History  . Not on file.   Social History Main Topics  . Smoking status: Former Smoker -- 1.00 packs/day for 55 years    Types: Cigarettes    Quit date: 05/05/2013  . Smokeless tobacco: Never Used     Comment: 07/07/2013 now using E- Cig.  . Alcohol Use: No     Comment: 07/07/2013 "I'll have a beer once in a blue moon"  . Drug Use: No  . Sexual Activity: Not Currently   Other Topics Concern  . Not on file   Social History Narrative  . No narrative on file     Review of Systems: General: negative for chills, fever, night sweats or weight changes.  Cardiovascular: negative for chest pain, dyspnea on exertion, edema, orthopnea, palpitations, paroxysmal nocturnal dyspnea or shortness of breath Dermatological: negative for rash Respiratory: negative for cough or wheezing Urologic: negative for hematuria Abdominal: negative for nausea, vomiting, diarrhea, bright red blood per rectum, melena, or hematemesis Neurologic: negative for visual changes, syncope, or dizziness All other systems reviewed and are otherwise negative except as noted above.    Blood pressure 102/64, pulse 90, height 5\' 9"  (1.753 m), weight 202 lb (91.627 kg).  General appearance: alert and no distress Neck: no adenopathy, no carotid bruit, no JVD, supple, symmetrical, trachea midline and thyroid not enlarged, symmetric, no tenderness/mass/nodules Lungs: clear to auscultation bilaterally Heart: regular rate and rhythm, S1, S2 normal, no murmur, click, rub or gallop Extremities: extremities normal, atraumatic, no cyanosis or edema  EKG performed  today  ASSESSMENT AND PLAN:   PVD - bilat SFA disease - s/p PCTA of right SFA 06/30/13; staged PCI to prox & mid third of Lt SFA Turbohawk arthrectomy 07/07/13 I performed diamondback orbital rotation arthrectomy of both SFAs in a staged procedure back in October. His followup Dopplers were markedly improved as were his symptoms. He saw Dennis Medici, PA-C in the office last week with complaints of recurrent medication and Dopplers confirmed recurrent disease. Patient is slowly re\re angiography and intervention.  CAD- s/p CABG X 4 05/12/13 Patient has CAD status post coronary bypass grafting x48/26 was 14. Because of continued LV dysfunction I re\re cathed him 08/25/13 revealing a high-grade stenosis in the PDA SVG which I stented. He continues where a life vest. He denies chest pain. Plan is to recheck a 2-D echo later this month to determine whether or not he needs to transition  to a permanent indwelling ICD      [name] MD [facility], [facility] 10/23/2013 4:32 PM

## 2013-10-23 NOTE — Assessment & Plan Note (Signed)
I performed diamondback orbital rotation arthrectomy of both SFAs in a staged procedure back in October. His followup Dopplers were markedly improved as were his symptoms. He saw [name], PA-C in the office last week with complaints of recurrent medication and Dopplers confirmed recurrent disease. Patient is slowly re\re angiography and intervention.

## 2013-10-23 NOTE — Patient Instructions (Addendum)
Dr. Allyson SabalBerry has ordered a peripheral [facility]  This procedure is going to look at the bloodflow in your lower extremities.  If Dr. Allyson SabalBerry is able to open up the arteries, you will have to spend one night in the hospital.  If he is not able to open the arteries, you will be able to go home that same day.    After the procedure, you will not be allowed to drive for 3 days or push, pull, or lift anything greater than 10 lbs for one week.    You will be required to have blood work prior to your procedure.  Our scheduler will advise you on when this needs to be done.    REPS: Vernia BuffMarc

## 2013-11-05 ENCOUNTER — Ambulatory Visit (HOSPITAL_COMMUNITY)
Admission: RE | Admit: 2013-11-05 | Discharge: 2013-11-05 | Disposition: A | Discharge status: Home / Self Care | Payer: Managed Care, Other (non HMO) | Source: Ambulatory Visit | Attending: Cardiovascular Disease | Admitting: Cardiovascular Disease

## 2013-11-05 DIAGNOSIS — I379 Nonrheumatic pulmonary valve disorder, unspecified: Secondary | ICD-10-CM | POA: Insufficient documentation

## 2013-11-05 DIAGNOSIS — I079 Rheumatic tricuspid valve disease, unspecified: Secondary | ICD-10-CM | POA: Insufficient documentation

## 2013-11-05 DIAGNOSIS — I10 Essential (primary) hypertension: Secondary | ICD-10-CM | POA: Insufficient documentation

## 2013-11-05 DIAGNOSIS — F17200 Nicotine dependence, unspecified, uncomplicated: Secondary | ICD-10-CM | POA: Insufficient documentation

## 2013-11-05 DIAGNOSIS — I059 Rheumatic mitral valve disease, unspecified: Secondary | ICD-10-CM | POA: Insufficient documentation

## 2013-11-05 DIAGNOSIS — I2589 Other forms of chronic ischemic heart disease: Secondary | ICD-10-CM | POA: Insufficient documentation

## 2013-11-05 DIAGNOSIS — I2510 Atherosclerotic heart disease of native coronary artery without angina pectoris: Secondary | ICD-10-CM

## 2013-11-05 NOTE — Progress Notes (Signed)
2D Echo Performed 11/05/2013    Dennis [name], RCS  

## 2013-11-10 ENCOUNTER — Encounter (HOSPITAL_COMMUNITY): Payer: Self-pay | Admitting: Pharmacy Technician

## 2013-11-10 ENCOUNTER — Ambulatory Visit (INDEPENDENT_AMBULATORY_CARE_PROVIDER_SITE_OTHER): Payer: Managed Care, Other (non HMO) | Admitting: Cardiology

## 2013-11-10 ENCOUNTER — Encounter: Payer: Self-pay | Admitting: Cardiology

## 2013-11-10 VITALS — BP 126/60 | HR 60 | Ht 69.5 in | Wt 206.0 lb

## 2013-11-10 DIAGNOSIS — I2589 Other forms of chronic ischemic heart disease: Secondary | ICD-10-CM

## 2013-11-10 DIAGNOSIS — I25810 Atherosclerosis of coronary artery bypass graft(s) without angina pectoris: Secondary | ICD-10-CM

## 2013-11-10 DIAGNOSIS — I739 Peripheral vascular disease, unspecified: Secondary | ICD-10-CM

## 2013-11-10 DIAGNOSIS — I255 Ischemic cardiomyopathy: Secondary | ICD-10-CM

## 2013-11-10 DIAGNOSIS — I10 Essential (primary) hypertension: Secondary | ICD-10-CM

## 2013-11-10 LAB — CBC
HEMATOCRIT: 35.3 % — AB (ref 39.0–52.0)
HEMOGLOBIN: 11.9 g/dL — AB (ref 13.0–17.0)
MCH: 25.9 pg — ABNORMAL LOW (ref 26.0–34.0)
MCHC: 33.7 g/dL (ref 30.0–36.0)
MCV: 76.7 fL — ABNORMAL LOW (ref 78.0–100.0)
Platelets: 252 10*3/uL (ref 150–400)
RBC: 4.6 MIL/uL (ref 4.22–5.81)
RDW: 15.8 % — ABNORMAL HIGH (ref 11.5–15.5)
WBC: 7.7 10*3/uL (ref 4.0–10.5)

## 2013-11-10 LAB — PROTIME-INR
INR: 1.02 (ref <1.50)
PROTHROMBIN TIME: 13.3 s (ref 11.6–15.2)

## 2013-11-10 LAB — BASIC METABOLIC PANEL
BUN: 19 mg/dL (ref 6–23)
CALCIUM: 9.7 mg/dL (ref 8.4–10.5)
CO2: 26 mEq/L (ref 19–32)
CREATININE: 1.18 mg/dL (ref 0.50–1.35)
Chloride: 100 mEq/L (ref 96–112)
Glucose, Bld: 178 mg/dL — ABNORMAL HIGH (ref 70–99)
Potassium: 5.3 mEq/L (ref 3.5–5.3)
Sodium: 137 mEq/L (ref 135–145)

## 2013-11-10 LAB — APTT: APTT: 34 s (ref 24–37)

## 2013-11-10 MED ORDER — AMLODIPINE BESYLATE 2.5 MG PO TABS: 2.5000 mg | ORAL_TABLET | Freq: Every day | ORAL | Status: DC

## 2013-11-10 NOTE — Assessment & Plan Note (Signed)
Still w/ bilateral lower extremity claudication. Recent f/u LEAs demonstrated reduction in ABIs bilaterally. Pt is scheduled for diagnostic PV angio with Dr[facility]Berry [facility].

## 2013-11-10 NOTE — Patient Instructions (Signed)
Start Amlodipine daily. Stop if you experience dizziness or your blood pressure drops too low. Continue all other medications.

## 2013-11-10 NOTE — Progress Notes (Signed)
Patient ID: Dennis [name], male   DOB: Jul 28, 1946, 68 y.o.   MRN: [id number]  11/10/2013 Dennis [name]   Jul 28, 1946  [id number]  Primary Physician: Stanford Scotlandavid S Jackson, MD Primary Cardiologist: Dr. Allyson SabalBerry  HPI:  The patient is a 68 y/o male, followed by Dr. Allyson SabalBerry, with a history of CAD, s/p CABG x 4 on 05/12/13 with a sequential LIMA to the LAD and diagonal branch, SVG to obtuse marginal branch and to the PDA. In December 2014, he had an acute systolic heart failure exacerbation, which led to a 2D echo, which revealed severely reduced systolic function with an EF of 20-25%. Subsequently, he was prescribed a LifeVest and underwent a Myoview NST which showed an inferior scar. Dr. Allyson SabalBerry then recommended a LHC to redefine his coronary anatomy. He underwent a LHC by Dr. Allyson SabalBerry on August 25, 2013 which demonstrated mid SVG-PDA stenosis. This was successfully treated with PCI utilizing a DES. All other grafts were patent. His other history is significant for poorly controlled DM, HTN, HLD and PVD. He recently quit smoking in August 2014 following CABG.   Since diagnosis of an ischemic cardiomyopathy, he has been treated medically with an ACE-I and Beta Blocker for the last 3 months. His Lisinopril and Coreg were both titrated to the maximum daily doses. He is on 40 mg of Lisinopril daily, as well as 25 mg of Coreg BID. He recently underwent a f/u 2D echocardiogram to reassess LV systolic function to see if an ICD is warranted.  The echo was performed on 11/05/12 and demonstrated improvement in LVF. His EF was estimated at 40-45%.  There was diffuse hypokinesis with mild regional variation.   He presents to clinic today for f/u. He denies chest pain, SOB, orthopnea, PND, weight gain, LEE, dizziness, syncope, near syncope. He reports daily compliance with his medications. He continues to endorse intermittent coldness of his fingers and toes as well as his nose. He has occasional tingling in his digits when this occurs. He  hasn't noticed any significant color changes in his hands and feet. Denies extremity weakness. He states that his condition has worsen since his BB was increased.    Current Outpatient Prescriptions  Medication Sig Dispense Refill  . acetaminophen (TYLENOL) 325 MG tablet Take 2 tablets (650 mg total) by mouth every 4 (four) hours as needed for headache or mild pain.      Marland Kitchen. aspirin EC 81 MG tablet Take 1 tablet (81 mg total) by mouth daily.  90 tablet  3  . carvedilol (COREG) 25 MG tablet Take 1 tablet (25 mg total) by mouth 2 (two) times daily.  60 tablet  6  . cetirizine (ZYRTEC) 10 MG tablet Take 10 mg by mouth daily as needed.       . clopidogrel (PLAVIX) 75 MG tablet Take 1 tablet (75 mg total) by mouth daily.  30 tablet  1  . furosemide (LASIX) 20 MG tablet Take 1 tablet (20 mg total) by mouth 2 (two) times daily.  75 tablet  6  . glipiZIDE (GLUCOTROL XL) 10 MG 24 hr tablet Take 10 mg by mouth daily with breakfast.       . lisinopril (PRINIVIL,ZESTRIL) 40 MG tablet Take 40 mg by mouth daily.       Marland Kitchen. living well with diabetes book MISC 1 each by Does not apply route once.      . metFORMIN (GLUCOPHAGE) 1000 MG tablet Take 1 tablet (1,000 mg total) by mouth 2 (two) times daily with  a meal.  60 tablet  1  . pantoprazole (PROTONIX) 40 MG tablet Take 1 tablet (40 mg total) by mouth daily.  30 tablet  11  . pravastatin (PRAVACHOL) 40 MG tablet Take 40 mg by mouth daily.        No current facility-administered medications for this visit.    Allergies  Allergen Reactions  . Chlorhexidine Rash    History   Social History  . Marital Status: Married    Spouse Name: N/A    Number of Children: N/A  . Years of Education: N/A   Occupational History  . Not on file.   Social History Main Topics  . Smoking status: Former Smoker -- 1.00 packs/day for 55 years    Types: Cigarettes    Quit date: 05/05/2013  . Smokeless tobacco: Never Used     Comment: 07/07/2013 now using E- Cig.  . Alcohol  Use: No     Comment: 07/07/2013 "I'll have a beer once in a blue moon"  . Drug Use: No  . Sexual Activity: Not Currently   Other Topics Concern  . Not on file   Social History Narrative  . No narrative on file     Review of Systems: General: negative for chills, fever, night sweats or weight changes.  Cardiovascular: negative for chest pain, dyspnea on exertion, edema, orthopnea, palpitations, paroxysmal nocturnal dyspnea or shortness of breath Dermatological: negative for rash Respiratory: negative for cough or wheezing Urologic: negative for hematuria Abdominal: negative for nausea, vomiting, diarrhea, bright red blood per rectum, melena, or hematemesis Neurologic: negative for visual changes, syncope, or dizziness All other systems reviewed and are otherwise negative except as noted above.    Blood pressure 126/60, pulse 60, height 5' 9.5" (1.765 m), weight 206 lb (93.441 kg).  General appearance: alert, cooperative and no distress Neck: no carotid bruit and no JVD Lungs: clear to auscultation bilaterally Heart: regular rate and rhythm, S1, S2 normal, no murmur, click, rub or gallop Extremities: no LEE, upper extremity strenght 5/5 bilaterally Pulses: 2+ and symmetric decreased distal pulses bilatealy  Skin: warm and dry Neurologic: Grossly normal     ASSESSMENT AND PLAN:   Ischemic cardiomyopathy- EF 20-25% by 2D 11/14 Repeat 2D echo on 11/05/13 demonstrated improved LV systolic function from 20-25% to 40-45%. No indication for ICD. Will discontinue LifeVest. Will continue however on 40 mg of Lisinopril daily and 25 mg of Coreg BID. BP and HR both stable. Will also continue on 20 mg of Lasix daily.   CAD of artery bypass graft- SVG-PDA DES 08/25/13 Denies CP. Resume DAPT with ASA + Plavix. Continue BB, ACE-I and statin.   PVD - bilat SFA disease - s/p PCTA of right SFA 06/30/13; staged PCI to prox & mid third of Lt SFA Turbohawk arthrectomy 07/07/13 Still w/ bilateral  lower extremity claudication. Recent f/u LEAs demonstrated reduction in ABIs bilaterally. Pt is scheduled for diagnostic PV angio with Dr. [name] on 11/16/13 at [facility].  Cold/ numbness of digits of hands and feet ? Raynaulds. This has seemed to worsen after increase in his BB, however I do not want to cut back on his BB dose. Will try a trial of low dose amlodipine, a vasoselective dihydropyridine CCB, which is ok to use in systolic dysfunction. Will prescribe 2.5 mg daily. BP is stable. Pt instructed to discontinue if he develops signs of hypotension.   PLAN  From a cardiac standpoint, pt was instructed to f/u with Dr. [name] in 2-3 months.  He will undergo a PV angio with Dr.  [name] next week on 11/16/13 at [facility].   SIMMONS, BRITTAINYPA-C 11/10/2013 12:08 PM

## 2013-11-10 NOTE — Assessment & Plan Note (Signed)
Denies CP. Resume DAPT with ASA + Plavix. Continue BB, ACE-I and statin.

## 2013-11-10 NOTE — Assessment & Plan Note (Signed)
Repeat 2D echo on 11/05/13 demonstrated improved LV systolic function from 20-25% to 40-45%. No indication for ICD. Will discontinue LifeVest. Will continue however, on 40 mg of Lisinopril daily and 25 mg of Coreg BID. BP and HR both stable. Will also continue on 20 mg of Lasix daily.

## 2013-11-16 ENCOUNTER — Encounter (HOSPITAL_COMMUNITY)
Admission: RE | Disposition: A | Discharge status: Home / Self Care | Payer: Self-pay | Source: Ambulatory Visit | Attending: Cardiovascular Disease

## 2013-11-16 ENCOUNTER — Ambulatory Visit (HOSPITAL_COMMUNITY)
Admission: RE | Admit: 2013-11-16 | Discharge: 2013-11-17 | Disposition: A | Discharge status: Home / Self Care | Payer: Managed Care, Other (non HMO) | Source: Ambulatory Visit | Attending: Cardiovascular Disease | Admitting: Cardiovascular Disease

## 2013-11-16 DIAGNOSIS — F17200 Nicotine dependence, unspecified, uncomplicated: Secondary | ICD-10-CM | POA: Diagnosis not present

## 2013-11-16 DIAGNOSIS — Z7982 Long term (current) use of aspirin: Secondary | ICD-10-CM | POA: Diagnosis not present

## 2013-11-16 DIAGNOSIS — E1159 Type 2 diabetes mellitus with other circulatory complications: Secondary | ICD-10-CM | POA: Diagnosis present

## 2013-11-16 DIAGNOSIS — I2589 Other forms of chronic ischemic heart disease: Secondary | ICD-10-CM | POA: Insufficient documentation

## 2013-11-16 DIAGNOSIS — I70219 Atherosclerosis of native arteries of extremities with intermittent claudication, unspecified extremity: Secondary | ICD-10-CM

## 2013-11-16 DIAGNOSIS — I739 Peripheral vascular disease, unspecified: Secondary | ICD-10-CM | POA: Diagnosis present

## 2013-11-16 DIAGNOSIS — E119 Type 2 diabetes mellitus without complications: Secondary | ICD-10-CM | POA: Insufficient documentation

## 2013-11-16 DIAGNOSIS — I25810 Atherosclerosis of coronary artery bypass graft(s) without angina pectoris: Secondary | ICD-10-CM | POA: Diagnosis present

## 2013-11-16 DIAGNOSIS — I10 Essential (primary) hypertension: Secondary | ICD-10-CM | POA: Diagnosis not present

## 2013-11-16 DIAGNOSIS — I2510 Atherosclerotic heart disease of native coronary artery without angina pectoris: Secondary | ICD-10-CM | POA: Insufficient documentation

## 2013-11-16 DIAGNOSIS — I255 Ischemic cardiomyopathy: Secondary | ICD-10-CM

## 2013-11-16 DIAGNOSIS — E785 Hyperlipidemia, unspecified: Secondary | ICD-10-CM | POA: Diagnosis not present

## 2013-11-16 DIAGNOSIS — I509 Heart failure, unspecified: Secondary | ICD-10-CM | POA: Insufficient documentation

## 2013-11-16 DIAGNOSIS — Z720 Tobacco use: Secondary | ICD-10-CM | POA: Diagnosis present

## 2013-11-16 DIAGNOSIS — I5022 Chronic systolic (congestive) heart failure: Secondary | ICD-10-CM | POA: Insufficient documentation

## 2013-11-16 DIAGNOSIS — Z7902 Long term (current) use of antithrombotics/antiplatelets: Secondary | ICD-10-CM | POA: Diagnosis not present

## 2013-11-16 DIAGNOSIS — Z951 Presence of aortocoronary bypass graft: Secondary | ICD-10-CM | POA: Diagnosis not present

## 2013-11-16 DIAGNOSIS — Z01818 Encounter for other preprocedural examination: Secondary | ICD-10-CM

## 2013-11-16 HISTORY — PX: LOWER EXTREMITY ANGIOGRAM: SHX5955

## 2013-11-16 HISTORY — DX: Peripheral vascular disease, unspecified: I73.9

## 2013-11-16 HISTORY — PX: LOWER EXTREMITY ANGIOGRAM: SHX5508

## 2013-11-16 HISTORY — DX: Chronic systolic (congestive) heart failure: I50.22

## 2013-11-16 LAB — GLUCOSE, CAPILLARY
GLUCOSE-CAPILLARY: 211 mg/dL — AB (ref 70–99)
Glucose-Capillary: 248 mg/dL — ABNORMAL HIGH (ref 70–99)
Glucose-Capillary: 161 mg/dL — ABNORMAL HIGH (ref 70–99)
Glucose-Capillary: 289 mg/dL — ABNORMAL HIGH (ref 70–99)
Glucose-Capillary: 291 mg/dL — ABNORMAL HIGH (ref 70–99)

## 2013-11-16 LAB — POCT ACTIVATED CLOTTING TIME
ACTIVATED CLOTTING TIME: 210 s
ACTIVATED CLOTTING TIME: 227 s
ACTIVATED CLOTTING TIME: 155 s
Activated Clotting Time: 182 seconds
Activated Clotting Time: 204 seconds
Activated Clotting Time: 238 seconds

## 2013-11-16 SURGERY — ANGIOGRAM, LOWER EXTREMITY
Anesthesia: LOCAL

## 2013-11-16 MED ORDER — SODIUM CHLORIDE 0.9 % IV SOLN
INTRAVENOUS | Status: DC
  Administered 2013-11-16: 07:00:00 via INTRAVENOUS

## 2013-11-16 MED ORDER — ACETAMINOPHEN 325 MG PO TABS: 650.0000 mg | ORAL_TABLET | ORAL | Status: DC | PRN

## 2013-11-16 MED ORDER — MIDAZOLAM HCL 2 MG/2ML IJ SOLN
INTRAMUSCULAR | Status: AC
  Filled 2013-11-16: qty 2

## 2013-11-16 MED ORDER — AMLODIPINE BESYLATE 2.5 MG PO TABS
2.5000 mg | ORAL_TABLET | Freq: Every day | ORAL | Status: DC
  Administered 2013-11-16 – 2013-11-17 (×2): 2.5 mg via ORAL
  Filled 2013-11-16 (×2): qty 1

## 2013-11-16 MED ORDER — GLIPIZIDE ER 10 MG PO TB24
10.0000 mg | ORAL_TABLET | Freq: Every day | ORAL | Status: DC
  Administered 2013-11-17: 08:00:00 10 mg via ORAL
  Filled 2013-11-16 (×2): qty 1

## 2013-11-16 MED ORDER — HYDRALAZINE HCL 20 MG/ML IJ SOLN
10.0000 mg | INTRAMUSCULAR | Status: DC
Start: 2013-11-16 — End: 2013-11-17
  Administered 2013-11-16: 10 mg via INTRAVENOUS
  Filled 2013-11-16: qty 1

## 2013-11-16 MED ORDER — INSULIN ASPART 100 UNIT/ML ~~LOC~~ SOLN
0.0000 [IU] | Freq: Every day | SUBCUTANEOUS | Status: DC
  Administered 2013-11-16: 3 [IU] via SUBCUTANEOUS

## 2013-11-16 MED ORDER — ASPIRIN EC 325 MG PO TBEC
325.0000 mg | DELAYED_RELEASE_TABLET | Freq: Every day | ORAL | Status: DC
  Administered 2013-11-17: 08:00:00 325 mg via ORAL
  Filled 2013-11-16 (×2): qty 1

## 2013-11-16 MED ORDER — CARVEDILOL 25 MG PO TABS
25.0000 mg | ORAL_TABLET | Freq: Two times a day (BID) | ORAL | Status: DC
  Administered 2013-11-16 – 2013-11-17 (×3): 25 mg via ORAL
  Filled 2013-11-16 (×2): qty 1
  Filled 2013-11-16: qty 2
  Filled 2013-11-16 (×2): qty 1
  Filled 2013-11-16: qty 2
  Filled 2013-11-16: qty 1

## 2013-11-16 MED ORDER — LIVING WELL WITH DIABETES BOOK
1.0000 | Freq: Once | Status: AC
  Administered 2013-11-16: 13:00:00 1
  Filled 2013-11-16: qty 1

## 2013-11-16 MED ORDER — CLOPIDOGREL BISULFATE 75 MG PO TABS
75.0000 mg | ORAL_TABLET | Freq: Every day | ORAL | Status: DC
  Administered 2013-11-17: 09:00:00 75 mg via ORAL
  Filled 2013-11-16: qty 1

## 2013-11-16 MED ORDER — PANTOPRAZOLE SODIUM 40 MG PO TBEC
40.0000 mg | DELAYED_RELEASE_TABLET | Freq: Every day | ORAL | Status: DC
  Administered 2013-11-16 – 2013-11-17 (×2): 40 mg via ORAL
  Filled 2013-11-16 (×2): qty 1

## 2013-11-16 MED ORDER — SODIUM CHLORIDE 0.9 % IJ SOLN: 3.0000 mL | INTRAMUSCULAR | Status: DC | PRN

## 2013-11-16 MED ORDER — ASPIRIN EC 81 MG PO TBEC
81.0000 mg | DELAYED_RELEASE_TABLET | Freq: Every day | ORAL | Status: DC
  Filled 2013-11-16: qty 1

## 2013-11-16 MED ORDER — ASPIRIN 81 MG PO CHEW
81.0000 mg | CHEWABLE_TABLET | ORAL | Status: AC
  Administered 2013-11-16: 81 mg via ORAL
  Filled 2013-11-16: qty 1

## 2013-11-16 MED ORDER — FUROSEMIDE 20 MG PO TABS
20.0000 mg | ORAL_TABLET | Freq: Two times a day (BID) | ORAL | Status: DC
  Administered 2013-11-16 – 2013-11-17 (×2): 20 mg via ORAL
  Filled 2013-11-16 (×5): qty 1

## 2013-11-16 MED ORDER — HEPARIN SODIUM (PORCINE) 1000 UNIT/ML IJ SOLN
INTRAMUSCULAR | Status: AC
  Filled 2013-11-16: qty 1

## 2013-11-16 MED ORDER — HEPARIN (PORCINE) IN NACL 2-0.9 UNIT/ML-% IJ SOLN
INTRAMUSCULAR | Status: AC
  Filled 2013-11-16: qty 1000

## 2013-11-16 MED ORDER — LORATADINE 10 MG PO TABS
10.0000 mg | ORAL_TABLET | Freq: Every day | ORAL | Status: DC
  Administered 2013-11-16 – 2013-11-17 (×2): 10 mg via ORAL
  Filled 2013-11-16 (×2): qty 1

## 2013-11-16 MED ORDER — FENTANYL CITRATE 0.05 MG/ML IJ SOLN
INTRAMUSCULAR | Status: AC
  Filled 2013-11-16: qty 2

## 2013-11-16 MED ORDER — CLOPIDOGREL BISULFATE 75 MG PO TABS: 75.0000 mg | ORAL_TABLET | Freq: Every day | ORAL | Status: DC

## 2013-11-16 MED ORDER — LISINOPRIL 40 MG PO TABS
40.0000 mg | ORAL_TABLET | Freq: Every day | ORAL | Status: DC
  Administered 2013-11-16 – 2013-11-17 (×2): 40 mg via ORAL
  Filled 2013-11-16 (×2): qty 1

## 2013-11-16 MED ORDER — INSULIN ASPART 100 UNIT/ML ~~LOC~~ SOLN
0.0000 [IU] | Freq: Three times a day (TID) | SUBCUTANEOUS | Status: DC
  Administered 2013-11-16: 18:00:00 8 [IU] via SUBCUTANEOUS
  Administered 2013-11-16: 13:00:00 3 [IU] via SUBCUTANEOUS
  Administered 2013-11-17: 08:00:00 5 [IU] via SUBCUTANEOUS

## 2013-11-16 MED ORDER — LIDOCAINE HCL (PF) 1 % IJ SOLN
INTRAMUSCULAR | Status: AC
  Filled 2013-11-16: qty 30

## 2013-11-16 MED ORDER — SODIUM CHLORIDE 0.9 % IV SOLN
INTRAVENOUS | Status: AC
  Administered 2013-11-16: 10:00:00 via INTRAVENOUS

## 2013-11-16 NOTE — Progress Notes (Signed)
Site area: left groin  Site Prior to Removal:  Level 0  Pressure Applied For 20 MINUTES    Minutes Beginning at 1240  Manual:   yes  Patient Status During Pull:  stable  Post Pull Groin Site:  Level 0  Post Pull Instructions Given:  yes  Post Pull Pulses Present:  yes  Dressing Applied:  yes  Comments:

## 2013-11-16 NOTE — CV Procedure (Signed)
Dennis [name] is a 68 y.o. male    [id number] LOCATION:  FACILITY: [facility]  PHYSICIAN: Nanetta BattyJonathan Paxton Kanaan, M.D. 26-Oct-1945   DATE OF PROCEDURE:  11/16/2013  DATE OF DISCHARGE:     PV Angiogram/Intervention    History obtained from chart review.Dennis [name] is a 68 year old married Caucasian male father of one, grandmother, grandfather is accompanied by his wife today. [facility] for evaluation of claudication and arterial Doppler studies which were obtained in our office 04/07/13. His cardiovascular risk factors include type 2 diabetes, hypertension, and hyperlipidemia. He has a 50-100-pack-year history of tobacco abuse currently smoking one pack to 2 packs a day. There is no family history of heart disease. He's never had a heart attack or stroke. He does complain of dyspnea on exertion. He has had claudication for the last 2 years worse over the last 3 months which is now lifestyle limiting. Doppler studies in our office performed 04/07/13 revealed a right ABI of 0.63 and a left ABI of 0.70. He had high-grade SFA disease bilaterally as well as tibial disease.because of a positive Myoview stress test the patient first had a diagnostic coronary arteriogram. Which showed 3 vessel disease with moderate LV dysfunction. He'll underwent coronary bypass grafting x4 by Dr. Rexanne ManoBrian Bartle on 05/12/13 with a sequential LIMA to the LAD and diagonal branch, vein to obtuse marginal branch and to the PDA. His postop course was uncomplicated. Following his coronary artery bypass graft surgery he underwent staged bilateral superficial femoral artery directional atherectomy with marked improvement in his claudication and arterial Doppler studies. This week he developed shortness of breath and was seen in an emergency room and diagnosed with congestive heart failure. He was treated with Lasix and referred back to us for further evaluation. He saw [name] PA-C in our office yesterday who  did a 2-D echocardiogram today that revealed an EF of 20-25%. This is a finding now almost 3 months status post coronary revascularization.  He is currently wearing a LifeVest. A recent 2-D echo revealed an ejection fraction of 20-25% with global hypokinesia. A Myoview stress test showed inferior scar. [name] underwent cardiac catheterization by myself/5/14 with a high-grade stenosis within his PDA graft was stented. He continues to wear a life vest with a 2-D echo scheduled several weeks now to determine whether he's had improvement in LV function and, whether he can take off his life vest or needs to transition to an ICD.  He saw [name] PAC in the office last week . Bilateral lower extremity pain similar to this preintervention symptoms. Followup Dopplers confirmed recurrent disease. Patient presents today for angiography and potential re\re intervention for lifestyle limiting claudication    PROCEDURE DESCRIPTION:   The patient was brought to the second floor [place] [facility] cath lab in the postabsorptive state. He was premedicated with Valium 5 mg by mouth, IV Versed and fentanyl. His left groinwas prepped and shaved in usual sterile fashion. Xylocaine 1% was used for local anesthesia. A 5 French sheath was inserted into the left common femoral artery using standard Seldinger technique.5 French pigtail catheters placed in the distal abdominal aorta. Bilateral iliac angiography and bifemoral runoff using bolus chase digital subtraction spelled table technique was performed. Visipaque dye was used for the entirety of the case. retrograde aortic pressure was monitored during the case.   HEMODYNAMICS:    AO SYSTOLIC/AO DIASTOLIC: 183/77   Angiographic Data:   1: Left lower extremity-the left common and  external iliac arteries were widely patent. The left SFA had a 95% focal proximal stenosis, 50% long segmental mid stenosis. There was a 50-60% stenosis in the above-the-knee  popliteal, 75% stenosis in the tibioperoneal trunk. There was one vessel runoff via the peroneal artery  2: Right lower extremity- the right common and external iliac arteries are widely patent. The entire proximal third of the right SFA had approximately 50% stenosis. There is a 95% stenosis in the distal third of the right SFA in the adductor canal. There was one vessel runoff via the peroneal  IMPRESSION:Dennis [name] has focal high grade stenosis in the distal right SFA and proximal left SFA corresponding to his abnormalities on duplex ultrasound. His previously atherectomized areas remain patent with moderate disease. Plan today will be to perform directional atherectomy of the distal right SFA stenosis using turbo hawk atherectomy device plus or minus drug coated balloon angioplasty to optimize restenosis.  Procedure Description:contralateral access was obtained with a crossover catheter, 35 Glidewire, and Rosen wire. A 7 French/55 cm Ansel sheath was advanced across the iliac bifurcation and placed in the right common femoral artery. The patient received 12 thousand  units of heparin intravenously with an ACT of 238. A total of 150 cc of contrast was administered to the patient. An 014/300 cm long [place] core wire was then passed down the SFA into the popliteal artery across the culprit lesion. Using an LXM  Atherectomy device multiple circumflex parenteral cuts were performed of the distal right SFA removing copious amount of atheromatous plaque. Following this, angioplasty was performed with a 5 mm x 4 cm long drug coated balloon at 4-6 atmospheres resulting in reduction of a 95% focal distal right SFA stenosis to 0% residual with excellent flow. There is no dissection. The perineal remained intact.  Final Impression: successful TurboHawk directional atherectomy, PTA using drug-coated balloon of high-grade distal right SFA stenosis for lifestyle limiting claudication. The patient is on dual antiplatelet  therapy.the long sheath was removed over an 035 wire and exchanged for a short 7 [place] sheath. The patient will be hydrated overnight. The sheath will be removed once the ACT falls below 170 and pressure will be held. The patient will be discharged home in the morning and will followup with me in one to 2 weeks. We will then consider doing staged proximal left SFA directional atherectomy.    [name] MD, [facility] 11/16/2013 9:26 AM

## 2013-11-16 NOTE — H&P (View-Only) (Signed)
10/23/2013 Dennis [name]   04-22-1946  [id number]  Primary Physician Stanford Scotlandavid S Jackson, MD Primary Cardiologist: Runell GessJonathan J. Jalaya Sarver MD Roseanne RenoFACP,FACC,FAHA, FSCAI   HPI:  Dennis [name] is a 68 year old married Caucasian male father of one, grandmother, grandfather is accompanied by his wife today. [facility] for evaluation of claudication and arterial Doppler studies which were obtained in our office 04/07/13. His cardiovascular risk factors include type 2 diabetes, hypertension, and hyperlipidemia. He has a 50-100-pack-year history of tobacco abuse currently smoking one pack to 2 packs a day. There is no family history of heart disease. He's never had a heart attack or stroke. He does complain of dyspnea on exertion. He has had claudication for the last 2 years worse over the last 3 months which is now lifestyle limiting. Doppler studies in our office performed 04/07/13 revealed a right ABI of 0.63 and a left ABI of 0.70. He had high-grade SFA disease bilaterally as well as tibial disease.because of a positive Myoview stress test the patient first had a diagnostic coronary arteriogram. Which showed 3 vessel disease with moderate LV dysfunction. He'll underwent coronary bypass grafting x4 by Dr. Rexanne ManoBrian Bartle on 05/12/13 with a sequential LIMA to the LAD and diagonal branch, vein to obtuse marginal branch and to the PDA. His postop course was uncomplicated. Following his coronary artery bypass graft surgery he underwent staged bilateral superficial femoral artery directional atherectomy with marked improvement in his claudication and arterial Doppler studies. This week he developed shortness of breath and was seen in an emergency room and diagnosed with congestive heart failure. He was treated with Lasix and referred back to us for further evaluation. He saw Dennis MediciBrittany Simmons PA-C in our office yesterday who did a 2-D echocardiogram today that revealed an EF of 20-25%. This is a finding  now almost 3 months status post coronary revascularization.  He is currently wearing a LifeVest. A recent 2-D echo revealed an ejection fraction of 20-25% with global hypokinesia. A Myoview stress test showed inferior scar. Dennis [name] underwent cardiac catheterization by myself/5/14 with a high-grade stenosis within his PDA graft was stented. He continues to wear a life vest with a 2-D echo scheduled several weeks now to determine whether he's had improvement in LV function and, whether he can take off his life vest or needs to transition to an ICD. [name] week . Bilateral lower extremity pain similar to this preintervention symptoms. Followup Dopplers confirmed recurrent disease.   Current Outpatient Prescriptions  Medication Sig Dispense Refill  . acetaminophen (TYLENOL) 325 MG tablet Take 2 tablets (650 mg total) by mouth every 4 (four) hours as needed for headache or mild pain.      Marland Kitchen. aspirin EC 81 MG tablet Take 1 tablet (81 mg total) by mouth daily.  90 tablet  3  . carvedilol (COREG) 25 MG tablet Take 1 tablet (25 mg total) by mouth 2 (two) times daily.  60 tablet  6  . cetirizine (ZYRTEC) 10 MG tablet Take 10 mg by mouth daily.      . clopidogrel (PLAVIX) 75 MG tablet Take 1 tablet (75 mg total) by mouth daily.  30 tablet  1  . furosemide (LASIX) 20 MG tablet Take 1 tablet (20 mg total) by mouth 2 (two) times daily.  75 tablet  6  . glipiZIDE (GLUCOTROL XL) 10 MG 24 hr tablet 10 mg.      .Marland Kitchen  lisinopril (PRINIVIL,ZESTRIL) 40 MG tablet Take 40 mg by mouth daily.       Marland Kitchen living well with diabetes book MISC 1 each by Does not apply route once.      . metFORMIN (GLUCOPHAGE) 1000 MG tablet Take 1 tablet (1,000 mg total) by mouth 2 (two) times daily with a meal.  60 tablet  1  . NITROSTAT 0.4 MG SL tablet Place 0.4 mg under the tongue every 5 (five) minutes as needed for chest pain.       . pantoprazole (PROTONIX) 40 MG tablet Take 1 tablet (40 mg total) by mouth  daily.  30 tablet  11  . pravastatin (PRAVACHOL) 40 MG tablet Take 40 mg by mouth daily.        No current facility-administered medications for this visit.    Allergies  Allergen Reactions  . Chlorhexidine Rash    History   Social History  . Marital Status: Married    Spouse Name: N/A    Number of Children: N/A  . Years of Education: N/A   Occupational History  . Not on file.   Social History Main Topics  . Smoking status: Former Smoker -- 1.00 packs/day for 55 years    Types: Cigarettes    Quit date: 05/05/2013  . Smokeless tobacco: Never Used     Comment: 07/07/2013 now using E- Cig.  . Alcohol Use: No     Comment: 07/07/2013 "I'll have a beer once in a blue moon"  . Drug Use: No  . Sexual Activity: Not Currently   Other Topics Concern  . Not on file   Social History Narrative  . No narrative on file     Review of Systems: General: negative for chills, fever, night sweats or weight changes.  Cardiovascular: negative for chest pain, dyspnea on exertion, edema, orthopnea, palpitations, paroxysmal nocturnal dyspnea or shortness of breath Dermatological: negative for rash Respiratory: negative for cough or wheezing Urologic: negative for hematuria Abdominal: negative for nausea, vomiting, diarrhea, bright red blood per rectum, melena, or hematemesis Neurologic: negative for visual changes, syncope, or dizziness All other systems reviewed and are otherwise negative except as noted above.    Blood pressure 102/64, pulse 90, height 5\' 9"  (1.753 m), weight 202 lb (91.627 kg).  General appearance: alert and no distress Neck: no adenopathy, no carotid bruit, no JVD, supple, symmetrical, trachea midline and thyroid not enlarged, symmetric, no tenderness/mass/nodules Lungs: clear to auscultation bilaterally Heart: regular rate and rhythm, S1, S2 normal, no murmur, click, rub or gallop Extremities: extremities normal, atraumatic, no cyanosis or edema  EKG performed  today  ASSESSMENT AND PLAN:   PVD - bilat SFA disease - s/p PCTA of right SFA 06/30/13; staged PCI to prox & mid third of Lt SFA Turbohawk arthrectomy 07/07/13 I performed diamondback orbital rotation arthrectomy of both SFAs in a staged procedure back in October. His followup Dopplers were markedly improved as were his symptoms. He saw Dennis Medici, PA-C in the office last week with complaints of recurrent medication and Dopplers confirmed recurrent disease. Patient is slowly re\re angiography and intervention.  CAD- s/p CABG X 4 05/12/13 Patient has CAD status post coronary bypass grafting x48/26 was 14. Because of continued LV dysfunction I re\re cathed him 08/25/13 revealing a high-grade stenosis in the PDA SVG which I stented. He continues where a life vest. He denies chest pain. Plan is to recheck a 2-D echo later this month to determine whether or not he needs to transition  to a permanent indwelling ICD      [name] MD [facility], [facility] 10/23/2013 4:32 PM

## 2013-11-16 NOTE — Interval H&P Note (Signed)
History and Physical Interval Note:  11/16/2013 7:40 AM  Dennis ModenaGeorge Bell  has presented today for surgery, with the diagnosis of Claudication  The various methods of treatment have been discussed with the patient and family. After consideration of risks, benefits and other options for treatment, the patient has consented to  Procedure(s): LOWER EXTREMITY ANGIOGRAM (N/A) as a surgical intervention .  The patient's history has been reviewed, patient examined, no change in status, stable for surgery.  I have reviewed the patient's chart and labs.  Questions were answered to the patient's satisfaction.     Runell GessBERRY,Javelle Donigan J

## 2013-11-17 ENCOUNTER — Encounter (HOSPITAL_COMMUNITY): Payer: Self-pay | Admitting: Nurse Practitioner

## 2013-11-17 DIAGNOSIS — I739 Peripheral vascular disease, unspecified: Secondary | ICD-10-CM

## 2013-11-17 DIAGNOSIS — F17200 Nicotine dependence, unspecified, uncomplicated: Secondary | ICD-10-CM

## 2013-11-17 DIAGNOSIS — I70219 Atherosclerosis of native arteries of extremities with intermittent claudication, unspecified extremity: Secondary | ICD-10-CM | POA: Diagnosis not present

## 2013-11-17 DIAGNOSIS — I255 Ischemic cardiomyopathy: Secondary | ICD-10-CM

## 2013-11-17 DIAGNOSIS — I25810 Atherosclerosis of coronary artery bypass graft(s) without angina pectoris: Secondary | ICD-10-CM

## 2013-11-17 LAB — CBC
HCT: 34.3 % — ABNORMAL LOW (ref 39.0–52.0)
HEMOGLOBIN: 11.3 g/dL — AB (ref 13.0–17.0)
MCH: 25.6 pg — AB (ref 26.0–34.0)
MCHC: 32.9 g/dL (ref 30.0–36.0)
MCV: 77.8 fL — AB (ref 78.0–100.0)
Platelets: 213 10*3/uL (ref 150–400)
RBC: 4.41 MIL/uL (ref 4.22–5.81)
RDW: 14.9 % (ref 11.5–15.5)
WBC: 6.8 10*3/uL (ref 4.0–10.5)

## 2013-11-17 LAB — BASIC METABOLIC PANEL
BUN: 12 mg/dL (ref 6–23)
CALCIUM: 9.3 mg/dL (ref 8.4–10.5)
CO2: 23 mEq/L (ref 19–32)
CREATININE: 0.95 mg/dL (ref 0.50–1.35)
Chloride: 106 mEq/L (ref 96–112)
GFR calc Af Amer: >90 mL/min (ref >90)
GFR calc non Af Amer: 84 mL/min — ABNORMAL LOW (ref >90)
GLUCOSE: 192 mg/dL — AB (ref 70–99)
Potassium: 4.3 mEq/L (ref 3.7–5.3)
SODIUM: 142 meq/L (ref 137–147)

## 2013-11-17 LAB — GLUCOSE, CAPILLARY: Glucose-Capillary: 243 mg/dL — ABNORMAL HIGH (ref 70–99)

## 2013-11-17 MED ORDER — METFORMIN HCL 1000 MG PO TABS: 1000.0000 mg | ORAL_TABLET | Freq: Two times a day (BID) | ORAL | Status: DC

## 2013-11-17 NOTE — Discharge Summary (Addendum)
Discharge Summary   Patient ID: Dennis [name],  MRN: [id number], DOB/AGE: 1946-04-04 68 y.o.  Admit date: 11/16/2013 Discharge date: 11/17/2013  Primary Care Provider: Stanford Scotlandavid S Jackson Primary Cardiologist: Erlene QuanJ. Berry, MD   Discharge Diagnoses Principal Problem:   Claudication  **S/P peripheral angiography and atherectomy with drug coated balloon angioplasty of the R SFA during this admission.  **Plan for staged PTA of proximal L SFA dzs.  Active Problems:   PAD (peripheral artery disease)   Type 2 diabetes mellitus   Tobacco abuse - ongoing   CAD of artery bypass graft x 4 04/2013 with PCI/DES of the VG->RPL 08/2013   Ischemic cardiomyopathy - EF improved to 40-45% 10/2013   Essential hypertension   Hyperlipidemia  Allergies Allergies  Allergen Reactions  . Chlorhexidine Rash   Procedures  Peripheral Angiography with percutaneous transluminal angioplasty 3.2.2015  Angiographic Data:   1: Left lower extremity-the left common and external iliac arteries were widely patent. The left SFA had a 95% focal proximal stenosis, 50% long segmental mid stenosis. There was a 50-60% stenosis in the above-the-knee popliteal, 75% stenosis in the tibioperoneal trunk. There was one vessel runoff via the peroneal artery  2: Right lower extremity- the right common and external iliac arteries are widely patent. The entire proximal third of the right SFA had approximately 50% stenosis. There is a 95% stenosis in the distal third of the right SFA in the adductor canal. There was one vessel runoff via the peroneal   **The R SFA was successfully treated with atherectomy and drug coated balloon angioplasty (5.0 x 40mm). _____________   History of Present Illness  68 year old male with prior history of coronary artery disease status post coronary artery bypass grafting x4 in August of 2014 with subsequent PCI and drug-eluting stent placement within the vein graft to the PDA in December 2014. Patient also  has a history of peripheral arterial disease and bilateral lower extremity claudication status post directional atherectomy of bilateral superficial femoral arteries in October of 2014. Patient was recently seen in clinic with recurrent bilateral lower extremity claudication and subsequently underwent arterial brachial indices in early February, revealing an ABI on the right of 0.77 and an ABI on the left of 0.85, suggestive of recurrent bilateral SFA stenosis. Decision was made to pursue repeat angiography.  Hospital Course  [facility] and underwent peripheral angiography revealing severe distal right SFA stenosis and severe proximal left SFA stenosis. Previously treated areas were patent. Attention was turned to the right SFA stenosis which was successfully treated with atherectomy and subsequent drug coated balloon angioplasty. It was felt that the proximal left SFA stenosis could be treated in a staged fashion within the next month. Post procedure, patient has had no recurrence of right lower extremity claudication and has been ambulating without difficulty. He will be discharged home today in good condition and has followup with Dr. Allyson SabalBerry on March 13, at which time consideration will be given for staged PTA of the left SFA.  Discharge Vitals Blood pressure 136/77, pulse 61, temperature 98.6 F (37 C), temperature source Oral, resp. rate 18, height 5' 9.5" (1.765 m), weight 203 lb 4.2 oz (92.2 kg), SpO2 96.00%.  Filed Weights   11/16/13 0554 11/17/13 0013  Weight: 202 lb (91.627 kg) 203 lb 4.2 oz (92.2 kg)   Labs  CBC  Recent Labs  11/17/13 0511  WBC 6.8  HGB 11.3*  HCT 34.3*  MCV 77.8*  PLT 213   Basic Metabolic Panel  Recent Labs  11/17/13 0511  NA 142  K 4.3  CL 106  CO2 23  GLUCOSE 192*  BUN 12  CREATININE 0.95  CALCIUM 9.3   Disposition  Pt is being discharged home today in good  condition.  Follow-up Plans & Appointments  Follow-up Information   Follow up with [name], MD On 11/27/2013. (9:00 AM)    Specialty:  Cardiology   Contact information:   [address] [place] [address] [phone]       Follow up with Stanford Scotland, MD. (as scheduled.)    Specialty:  Family Medicine   Contact information:   [address] [place] [address] [phone]      Discharge Medications    Medication List         acetaminophen 325 MG tablet  Commonly known as:  TYLENOL  Take 2 tablets (650 mg total) by mouth every 4 (four) hours as needed for headache or mild pain.     amLODipine 2.5 MG tablet  Commonly known as:  NORVASC  Take 1 tablet (2.5 mg total) by mouth daily.     aspirin EC 81 MG tablet  Take 1 tablet (81 mg total) by mouth daily.     carvedilol 25 MG tablet  Commonly known as:  COREG  Take 1 tablet (25 mg total) by mouth 2 (two) times daily.     cetirizine 10 MG tablet  Commonly known as:  ZYRTEC  Take 10 mg by mouth daily as needed.     clopidogrel 75 MG tablet  Commonly known as:  PLAVIX  Take 1 tablet (75 mg total) by mouth daily.     furosemide 20 MG tablet  Commonly known as:  LASIX  Take 1 tablet (20 mg total) by mouth 2 (two) times daily.     glipiZIDE 10 MG 24 hr tablet  Commonly known as:  GLUCOTROL XL  Take 10 mg by mouth daily with breakfast.     lisinopril 40 MG tablet  Commonly known as:  PRINIVIL,ZESTRIL  Take 40 mg by mouth daily.     living well with diabetes book Misc  1 each by Does not apply route once.     metFORMIN 1000 MG tablet - to be resumed 11/19/2013.  Commonly known as:  GLUCOPHAGE  Take 1 tablet (1,000 mg total) by mouth 2 (two) times daily with a meal.     pantoprazole 40 MG tablet  Commonly known as:  PROTONIX  Take 1 tablet (40 mg total) by mouth daily.     pravastatin 40 MG tablet  Commonly known as:  PRAVACHOL  Take 40 mg by mouth daily.        Outstanding Labs/Studies  None  Duration of Discharge Encounter   Greater than 30 minutes including physician time.  Signed, Nicolasa Ducking NP 11/17/2013, 8:40 AM   I have examined the patient and reviewed assessment and plan and discussed with patient.  Agree with above as stated.  See my progress  note for further details. Trace but palpable DP pulses bilaterally  Ahmani Prehn S.

## 2013-11-17 NOTE — Progress Notes (Signed)
Patient Name: Dennis [name] Date of Encounter: 11/17/2013   Principal Problem:   Claudication Active Problems:   PAD (peripheral artery disease)   Type 2 diabetes mellitus not at goal   Tobacco abuse   CAD of artery bypass graft- SVG-PDA DES 08/25/13   Ischemic cardiomyopathy   Essential hypertension   Hyperlipidemia   SUBJECTIVE  No leg/groin pain overnight.  No chest pain or sob.  Eager to go home.  Eager to have next procedure scheduled.  CURRENT MEDS . amLODipine  2.5 mg Oral Daily  . aspirin EC  325 mg Oral Daily  . carvedilol  25 mg Oral BID WC  . clopidogrel  75 mg Oral Q breakfast  . furosemide  20 mg Oral BID  . glipiZIDE  10 mg Oral Q breakfast  . hydrALAZINE  10 mg Intravenous UD  . insulin aspart  0-15 Units Subcutaneous TID WC  . insulin aspart  0-5 Units Subcutaneous QHS  . lisinopril  40 mg Oral Daily  . loratadine  10 mg Oral Daily  . pantoprazole  40 mg Oral Daily   OBJECTIVE  Filed Vitals:   11/16/13 2022 11/17/13 0013 11/17/13 0430 11/17/13 0748  BP: 147/74 132/54 143/68 136/77  Pulse: 85 55 82 61  Temp: 98.3 F (36.8 C) 97.7 F (36.5 C) 97.9 F (36.6 C) 98.6 F (37 C)  TempSrc: Oral Oral Oral Oral  Resp: 18 16 17 18   Height:      Weight:  203 lb 4.2 oz (92.2 kg)    SpO2: 96% 97% 97% 96%    Intake/Output Summary (Last 24 hours) at 11/17/13 0836 Last data filed at 11/17/13 0656  Gross per 24 hour  Intake 1211.25 ml  Output      0 ml  Net 1211.25 ml   Filed Weights   11/16/13 0554 11/17/13 0013  Weight: 202 lb (91.627 kg) 203 lb 4.2 oz (92.2 kg)    PHYSICAL EXAM  General: Pleasant, NAD. Neuro: Alert and oriented X 3. Moves all extremities spontaneously. Psych: Normal affect. HEENT:  Normal  Neck: Supple without bruits or JVD. Lungs:  Resp regular and unlabored, CTA. Heart: RRR no s3, s4, or murmurs. Abdomen: Soft, non-tender, non-distended, BS + x 4.  Extremities: No clubbing, cyanosis or edema. DP/PT/Radials 1+ and equal  bilaterally.  Left groin cath site w/o bleeding/bruit/hematoma.  Accessory Clinical Findings  CBC  Recent Labs  11/17/13 0511  WBC 6.8  HGB 11.3*  HCT 34.3*  MCV 77.8*  PLT 213   Basic Metabolic Panel  Recent Labs  11/17/13 0511  NA 142  K 4.3  CL 106  CO2 23  GLUCOSE 192*  BUN 12  CREATININE 0.95  CALCIUM 9.3   TELE  Rsr/sinus brady - 50's to 80's.  Radiology/Studies  No results found.  ASSESSMENT AND PLAN  1.  Claudication/PAD:  S/p angiography yesterday revealing severe distal R SFA and prox L SFA dzs.  The R SFA was successfully treated with atherectomy and drug coated balloon angioplasty.  No left groin or right leg pain this AM.  Cont asa, plavix, statin (on prava @ home).  He has f/u with Dr. [name] on 3/13 @ 9am, @ which time arrangement will be made for L SFA procedure.  2.  CAD: no chest pain or sob.  Cont asa, plavix, bb, statin.  3.  ICM/Chronic systolic chf:  Euvolemic.  Cont bb/acei/lasix.  4.  HTN:  Stable.  Cont bb/acei.  5.  HL:  Resume pravastatin.  6.  DM:  Hold metformin x 48 hrs. Cont glipizide.  7.  Tob Abuse:  Cessation advised.  Signed, Nicolasa Duckinghristopher Berge NP  I have examined the patient and reviewed assessment and plan and discussed with patient.  Agree with above as stated.  Trace DP pulses bilaterally by palpation.  They gave been using Doppler to obtain the PT pulses.  No left groin hematoma.  No painwith walking the halls.  Advised that he should stop smoking.  OK for d/c.  Zaeda Mcferran S.

## 2013-11-17 NOTE — Discharge Instructions (Signed)

## 2013-11-27 ENCOUNTER — Encounter: Payer: Self-pay | Admitting: Cardiovascular Disease

## 2013-11-27 ENCOUNTER — Ambulatory Visit (INDEPENDENT_AMBULATORY_CARE_PROVIDER_SITE_OTHER): Payer: Managed Care, Other (non HMO) | Admitting: Cardiovascular Disease

## 2013-11-27 ENCOUNTER — Telehealth: Payer: Self-pay | Admitting: *Deleted

## 2013-11-27 VITALS — BP 102/60 | HR 64 | Ht 65.5 in | Wt 208.0 lb

## 2013-11-27 DIAGNOSIS — Z01818 Encounter for other preprocedural examination: Secondary | ICD-10-CM

## 2013-11-27 DIAGNOSIS — I10 Essential (primary) hypertension: Secondary | ICD-10-CM

## 2013-11-27 DIAGNOSIS — E785 Hyperlipidemia, unspecified: Secondary | ICD-10-CM

## 2013-11-27 DIAGNOSIS — Z79899 Other long term (current) drug therapy: Secondary | ICD-10-CM

## 2013-11-27 DIAGNOSIS — D689 Coagulation defect, unspecified: Secondary | ICD-10-CM

## 2013-11-27 DIAGNOSIS — I739 Peripheral vascular disease, unspecified: Secondary | ICD-10-CM

## 2013-11-27 MED ORDER — EZETIMIBE 10 MG PO TABS: 10.0000 mg | ORAL_TABLET | Freq: Every day | ORAL | Status: DC

## 2013-11-27 MED ORDER — EZETIMIBE 10 MG PO TABS
10.0000 mg | ORAL_TABLET | Freq: Every day | ORAL | Status: DC
Start: 2013-11-27 — End: 2015-04-15

## 2013-11-27 NOTE — Assessment & Plan Note (Signed)
Well-controlled on current medications 

## 2013-11-27 NOTE — Patient Instructions (Addendum)
Dr. Allyson SabalBerry has ordered [facility]  This procedure is going to look at the bloodflow in your lower extremities.  If Dr. Allyson SabalBerry is able to open up the arteries, you will have to spend one night in the hospital.  If he is not able to open the arteries, you will be able to go home that same day.    After the procedure, you will not be allowed to drive for 3 days or push, pull, or lift anything greater than 10 lbs for one week.    Dr Allyson SabalBerry has ordered some lab work - CBC, BMP, PT, PTT  Dr Allyson SabalBerry has made the following medication changes: START Zetia 10 mg once daily.   Rep - Lorin PicketScott

## 2013-11-27 NOTE — Assessment & Plan Note (Signed)
On statin therapy not at goal. We will add Zetia 10 mg daily

## 2013-11-27 NOTE — Progress Notes (Signed)
11/27/2013 Dennis [name]   1946-03-26  [id number]  Primary Physician Dennis Scotlandavid S Jackson, MD Primary Cardiologist: Dennis GessJonathan J. Berry MD Dennis [name],FACC,FAHA, FSCAI    HPI:  .[name] is a 68 year old married Caucasian male father of one, grandmother, grandfather is accompanied by his wife today. [facility] for evaluation of claudication and arterial Doppler studies which were obtained in our office 04/07/13. His cardiovascular risk factors include type 2 diabetes, hypertension, and hyperlipidemia. He has a 50-100-pack-year history of tobacco abuse currently smoking one pack to 2 packs a day. There is no family history of heart disease. He's never had a heart attack or stroke. He does complain of dyspnea on exertion. He has had claudication for the last 2 years worse over the last 3 months which is now lifestyle limiting. Doppler studies in our office performed 04/07/13 revealed a right ABI of 0.63 and a left ABI of 0.70. He had high-grade SFA disease bilaterally as well as tibial disease.because of a positive Myoview stress test the patient first had a diagnostic coronary arteriogram. Which showed 3 vessel disease with moderate LV dysfunction. He'll underwent coronary bypass grafting x4 by Dr. Rexanne ManoBrian [name] on 05/12/13 with a sequential LIMA to the LAD and diagonal branch, vein to obtuse marginal branch and to the PDA. His postop course was uncomplicated. Following his coronary artery bypass graft surgery he underwent staged bilateral superficial femoral artery directional atherectomy with marked improvement in his claudication and arterial Doppler studies. This week he developed shortness of breath and was seen in an emergency room and diagnosed with congestive heart failure. He was treated with Lasix and referred back to us for further evaluation. He saw Dennis MediciBrittany Simmons PA-C in our office yesterday who did a 2-D echocardiogram today that revealed an EF of 20-25%. This is a  finding now almost 3 months status post coronary revascularization.  He is currently wearing a LifeVest. A recent 2-D echo revealed an ejection fraction of 20-25% with global hypokinesia. A Myoview stress test showed inferior scar. [name] underwent cardiac catheterization by myself/5/14 with a high-grade stenosis within his PDA graft was stented. He continues to wear a life vest with a 2-D echo scheduled several weeks now to determine whether he's had improvement in LV function and, whether he can take off his life vest or needs to transition to an ICD.  He saw Dennis [name] in the office last week . Bilateral lower extremity pain similar to this preintervention symptoms. Followup Dopplers confirmed recurrent disease.on 11/16/13 he underwent re\re angiography, TurboHAWK directional arthrectomy, PTA of distal right SFA with drug-eluting balloon. He had a excellent angiographic and clinical result. He has residual disease in the left left lower extremity lifestyle limiting claudication.  Current Outpatient Prescriptions  Medication Sig Dispense Refill  . acetaminophen (TYLENOL) 325 MG tablet Take 2 tablets (650 mg total) by mouth every 4 (four) hours as needed for headache or mild pain.      Marland Kitchen. amLODipine (NORVASC) 2.5 MG tablet Take 1 tablet (2.5 mg total) by mouth daily.  30 tablet  5  . aspirin EC 81 MG tablet Take 1 tablet (81 mg total) by mouth daily.  90 tablet  3  . carvedilol (COREG) 25 MG tablet Take 1 tablet (25 mg total) by mouth 2 (two) times daily.  60 tablet  6  . cetirizine (ZYRTEC) 10 MG tablet Take 10 mg by mouth daily as needed.       .Marland Kitchen  clopidogrel (PLAVIX) 75 MG tablet Take 1 tablet (75 mg total) by mouth daily.  30 tablet  1  . furosemide (LASIX) 20 MG tablet Take 1 tablet (20 mg total) by mouth 2 (two) times daily.  75 tablet  6  . glipiZIDE (GLUCOTROL XL) 10 MG 24 hr tablet Take 10 mg by mouth daily with breakfast.       . lisinopril (PRINIVIL,ZESTRIL) 40 MG tablet Take 40 mg  by mouth daily.       Marland Kitchen living well with diabetes book MISC 1 each by Does not apply route once.      . metFORMIN (GLUCOPHAGE) 1000 MG tablet Take 1 tablet (1,000 mg total) by mouth 2 (two) times daily with a meal.  60 tablet  1  . pantoprazole (PROTONIX) 40 MG tablet Take 1 tablet (40 mg total) by mouth daily.  30 tablet  11  . pravastatin (PRAVACHOL) 40 MG tablet Take 40 mg by mouth daily.       . sitaGLIPtin-metformin (JANUMET) 50-1000 MG per tablet Take 1 tablet by mouth 2 (two) times daily with a meal.        No current facility-administered medications for this visit.    Allergies  Allergen Reactions  . Chlorhexidine Rash    History   Social History  . Marital Status: Married    Spouse Name: N/A    Number of Children: N/A  . Years of Education: N/A   Occupational History  . Not on file.   Social History Main Topics  . Smoking status: Former Smoker -- 1.00 packs/day for 55 years    Types: Cigarettes    Quit date: 05/05/2013  . Smokeless tobacco: Never Used     Comment: 07/07/2013 now using E- Cig.  . Alcohol Use: No     Comment: 07/07/2013 "I'll have a beer once in a blue moon"  . Drug Use: No  . Sexual Activity: Not Currently   Other Topics Concern  . Not on file   Social History Narrative  . No narrative on file     Review of Systems: General: negative for chills, fever, night sweats or weight changes.  Cardiovascular: negative for chest pain, dyspnea on exertion, edema, orthopnea, palpitations, paroxysmal nocturnal dyspnea or shortness of breath Dermatological: negative for rash Respiratory: negative for cough or wheezing Urologic: negative for hematuria Abdominal: negative for nausea, vomiting, diarrhea, bright red blood per rectum, melena, or hematemesis Neurologic: negative for visual changes, syncope, or dizziness All other systems reviewed and are otherwise negative except as noted above.    Blood pressure 102/60, pulse 64, height 5' 5.5" (1.664  m), weight 208 lb (94.348 kg).  General appearance: alert and no distress Neck: no adenopathy, no carotid bruit, no JVD, supple, symmetrical, trachea midline and thyroid not enlarged, symmetric, no tenderness/mass/nodules Lungs: clear to auscultation bilaterally Heart: regular rate and rhythm, S1, S2 normal, no murmur, click, rub or gallop Extremities: extremities normal, atraumatic, no cyanosis or edema and the left femoral arterial puncture site is well-healed  EKG not performed today  ASSESSMENT AND PLAN:   PVD - bilat SFA disease - s/p PCTA of right SFA 06/30/13; staged PCI to prox & mid third of Lt SFA Turbohawk arthrectomy 07/07/13 [name] recently underwent TurboHawk directional atherectomy and drug-eluting balloon angioplasty of a high-grade distal right SFA stenosis on 11/16/13 with an excellent angiographic and clinical result. He does have one vessel runoff bilaterally via peroneal arteries. He has a high-grade proximal left SFA stenosis,  otherwise diffuse left SFA disease as well as a 75% left tibial peroneal trunk stenosis. The plan is to bring him back for [facility] directional atherectomy of the proximal left SFA post MI was tibial perineal trunk intervention lifestyle limiting claudication.  Hyperlipidemia On statin therapy not at goal. We will add Zetia 10 mg daily  Essential hypertension Well-controlled on current medications      Dennis Gess MD [facility], [facility] 11/27/2013 10:03 AM

## 2013-11-27 NOTE — Assessment & Plan Note (Signed)
[name] recently underwent TurboHawk directional atherectomy and drug-eluting balloon angioplasty of a high-grade distal right SFA stenosis on 11/16/13 with an excellent angiographic and clinical result. He does have one vessel runoff bilaterally via peroneal arteries. He has a high-grade proximal left SFA stenosis, otherwise diffuse left SFA disease as well as a 75% left tibial peroneal trunk stenosis. [facility] proximal left SFA post MI was tibial perineal trunk intervention lifestyle limiting claudication.

## 2013-11-30 ENCOUNTER — Encounter (HOSPITAL_COMMUNITY): Payer: Self-pay | Admitting: Pharmacy Technician

## 2013-12-01 LAB — CBC
HCT: 33.3 % — ABNORMAL LOW (ref 39.0–52.0)
HEMOGLOBIN: 10.7 g/dL — AB (ref 13.0–17.0)
MCH: 24.8 pg — AB (ref 26.0–34.0)
MCHC: 32.1 g/dL (ref 30.0–36.0)
MCV: 77.3 fL — AB (ref 78.0–100.0)
Platelets: 261 10*3/uL (ref 150–400)
RBC: 4.31 MIL/uL (ref 4.22–5.81)
RDW: 15.5 % (ref 11.5–15.5)
WBC: 8 10*3/uL (ref 4.0–10.5)

## 2013-12-01 LAB — BASIC METABOLIC PANEL
BUN: 22 mg/dL (ref 6–23)
CALCIUM: 9.4 mg/dL (ref 8.4–10.5)
CHLORIDE: 104 meq/L (ref 96–112)
CO2: 20 meq/L (ref 19–32)
CREATININE: 1.38 mg/dL — AB (ref 0.50–1.35)
GLUCOSE: 236 mg/dL — AB (ref 70–99)
Potassium: 5 mEq/L (ref 3.5–5.3)
Sodium: 135 mEq/L (ref 135–145)

## 2013-12-01 LAB — APTT: APTT: 34 s (ref 24–37)

## 2013-12-01 LAB — PROTIME-INR
INR: 1.06 (ref <1.50)
Prothrombin Time: 13.7 seconds (ref 11.6–15.2)

## 2013-12-07 ENCOUNTER — Encounter (HOSPITAL_COMMUNITY): Payer: Self-pay | Admitting: General Practice

## 2013-12-07 ENCOUNTER — Ambulatory Visit (HOSPITAL_COMMUNITY)
Admission: RE | Admit: 2013-12-07 | Discharge: 2013-12-08 | Disposition: A | Discharge status: Home / Self Care | Payer: Managed Care, Other (non HMO) | Source: Ambulatory Visit | Attending: Cardiovascular Disease | Admitting: Cardiovascular Disease

## 2013-12-07 ENCOUNTER — Encounter (HOSPITAL_COMMUNITY)
Admission: RE | Disposition: A | Discharge status: Home / Self Care | Payer: Self-pay | Source: Ambulatory Visit | Attending: Cardiovascular Disease

## 2013-12-07 DIAGNOSIS — I70219 Atherosclerosis of native arteries of extremities with intermittent claudication, unspecified extremity: Secondary | ICD-10-CM | POA: Insufficient documentation

## 2013-12-07 DIAGNOSIS — E119 Type 2 diabetes mellitus without complications: Secondary | ICD-10-CM

## 2013-12-07 DIAGNOSIS — I509 Heart failure, unspecified: Secondary | ICD-10-CM | POA: Insufficient documentation

## 2013-12-07 DIAGNOSIS — E785 Hyperlipidemia, unspecified: Secondary | ICD-10-CM

## 2013-12-07 DIAGNOSIS — Z720 Tobacco use: Secondary | ICD-10-CM | POA: Diagnosis present

## 2013-12-07 DIAGNOSIS — Z951 Presence of aortocoronary bypass graft: Secondary | ICD-10-CM | POA: Insufficient documentation

## 2013-12-07 DIAGNOSIS — R931 Abnormal findings on diagnostic imaging of heart and coronary circulation: Secondary | ICD-10-CM

## 2013-12-07 DIAGNOSIS — Z01818 Encounter for other preprocedural examination: Secondary | ICD-10-CM

## 2013-12-07 DIAGNOSIS — I739 Peripheral vascular disease, unspecified: Secondary | ICD-10-CM | POA: Diagnosis present

## 2013-12-07 DIAGNOSIS — E1159 Type 2 diabetes mellitus with other circulatory complications: Secondary | ICD-10-CM | POA: Diagnosis present

## 2013-12-07 DIAGNOSIS — F17200 Nicotine dependence, unspecified, uncomplicated: Secondary | ICD-10-CM | POA: Insufficient documentation

## 2013-12-07 DIAGNOSIS — Z7902 Long term (current) use of antithrombotics/antiplatelets: Secondary | ICD-10-CM | POA: Insufficient documentation

## 2013-12-07 DIAGNOSIS — I10 Essential (primary) hypertension: Secondary | ICD-10-CM | POA: Diagnosis present

## 2013-12-07 DIAGNOSIS — M79605 Pain in left leg: Secondary | ICD-10-CM

## 2013-12-07 DIAGNOSIS — I2510 Atherosclerotic heart disease of native coronary artery without angina pectoris: Secondary | ICD-10-CM

## 2013-12-07 DIAGNOSIS — D649 Anemia, unspecified: Secondary | ICD-10-CM | POA: Insufficient documentation

## 2013-12-07 DIAGNOSIS — Z7982 Long term (current) use of aspirin: Secondary | ICD-10-CM | POA: Insufficient documentation

## 2013-12-07 DIAGNOSIS — M79604 Pain in right leg: Secondary | ICD-10-CM

## 2013-12-07 HISTORY — PX: LOWER EXTREMITY ANGIOGRAM: SHX5955

## 2013-12-07 HISTORY — PX: LOWER EXTREMITY ANGIOGRAM: SHX5508

## 2013-12-07 LAB — POCT ACTIVATED CLOTTING TIME
Activated Clotting Time: 188 seconds
Activated Clotting Time: 232 seconds
Activated Clotting Time: 199 seconds
Activated Clotting Time: 166 seconds

## 2013-12-07 LAB — GLUCOSE, CAPILLARY
GLUCOSE-CAPILLARY: 229 mg/dL — AB (ref 70–99)
Glucose-Capillary: 299 mg/dL — ABNORMAL HIGH (ref 70–99)
Glucose-Capillary: 250 mg/dL — ABNORMAL HIGH (ref 70–99)
Glucose-Capillary: 265 mg/dL — ABNORMAL HIGH (ref 70–99)

## 2013-12-07 SURGERY — ANGIOGRAM, LOWER EXTREMITY
Anesthesia: LOCAL | Laterality: Left

## 2013-12-07 MED ORDER — LISINOPRIL 40 MG PO TABS
40.0000 mg | ORAL_TABLET | Freq: Every day | ORAL | Status: DC
  Administered 2013-12-08: 40 mg via ORAL
  Filled 2013-12-07: qty 1

## 2013-12-07 MED ORDER — LINAGLIPTIN 5 MG PO TABS
5.0000 mg | ORAL_TABLET | Freq: Every day | ORAL | Status: DC
  Administered 2013-12-07 – 2013-12-08 (×2): 5 mg via ORAL
  Filled 2013-12-07 (×2): qty 1

## 2013-12-07 MED ORDER — CLOPIDOGREL BISULFATE 75 MG PO TABS
75.0000 mg | ORAL_TABLET | Freq: Every day | ORAL | Status: DC
  Administered 2013-12-08: 10:00:00 75 mg via ORAL
  Filled 2013-12-07: qty 1

## 2013-12-07 MED ORDER — DIAZEPAM 5 MG PO TABS
5.0000 mg | ORAL_TABLET | ORAL | Status: AC
  Administered 2013-12-07: 5 mg via ORAL
  Filled 2013-12-07: qty 1

## 2013-12-07 MED ORDER — AMLODIPINE BESYLATE 2.5 MG PO TABS
2.5000 mg | ORAL_TABLET | Freq: Every day | ORAL | Status: DC
  Administered 2013-12-08: 10:00:00 2.5 mg via ORAL
  Filled 2013-12-07 (×2): qty 1

## 2013-12-07 MED ORDER — LORATADINE 10 MG PO TABS
10.0000 mg | ORAL_TABLET | Freq: Every day | ORAL | Status: DC
  Administered 2013-12-08: 10 mg via ORAL
  Filled 2013-12-07: qty 1

## 2013-12-07 MED ORDER — HEPARIN (PORCINE) IN NACL 2-0.9 UNIT/ML-% IJ SOLN
INTRAMUSCULAR | Status: AC
  Filled 2013-12-07: qty 500

## 2013-12-07 MED ORDER — GLIPIZIDE ER 10 MG PO TB24
10.0000 mg | ORAL_TABLET | Freq: Every day | ORAL | Status: DC
  Administered 2013-12-08: 10 mg via ORAL
  Filled 2013-12-07 (×2): qty 1

## 2013-12-07 MED ORDER — SODIUM CHLORIDE 0.9 % IJ SOLN
3.0000 mL | INTRAMUSCULAR | Status: DC | PRN
Start: 2013-12-07 — End: 2013-12-07

## 2013-12-07 MED ORDER — SODIUM CHLORIDE 0.9 % IV SOLN
INTRAVENOUS | Status: DC
  Administered 2013-12-07: 07:00:00 via INTRAVENOUS

## 2013-12-07 MED ORDER — ACETAMINOPHEN 325 MG PO TABS: 650.0000 mg | ORAL_TABLET | ORAL | Status: DC | PRN

## 2013-12-07 MED ORDER — INSULIN ASPART 100 UNIT/ML ~~LOC~~ SOLN
0.0000 [IU] | Freq: Three times a day (TID) | SUBCUTANEOUS | Status: DC
  Administered 2013-12-07: 19:00:00 5 [IU] via SUBCUTANEOUS

## 2013-12-07 MED ORDER — LIDOCAINE HCL (PF) 1 % IJ SOLN
INTRAMUSCULAR | Status: AC
  Filled 2013-12-07: qty 30

## 2013-12-07 MED ORDER — SITAGLIPTIN PHOS-METFORMIN HCL 50-1000 MG PO TABS: 1.0000 | ORAL_TABLET | Freq: Two times a day (BID) | ORAL | Status: DC

## 2013-12-07 MED ORDER — ASPIRIN EC 81 MG PO TBEC
81.0000 mg | DELAYED_RELEASE_TABLET | Freq: Every day | ORAL | Status: DC
  Administered 2013-12-08: 81 mg via ORAL
  Filled 2013-12-07: qty 1

## 2013-12-07 MED ORDER — FUROSEMIDE 20 MG PO TABS
20.0000 mg | ORAL_TABLET | Freq: Two times a day (BID) | ORAL | Status: DC | PRN
  Filled 2013-12-07: qty 1

## 2013-12-07 MED ORDER — EZETIMIBE 10 MG PO TABS
10.0000 mg | ORAL_TABLET | Freq: Every day | ORAL | Status: DC
  Administered 2013-12-07: 19:00:00 10 mg via ORAL
  Filled 2013-12-07 (×2): qty 1

## 2013-12-07 MED ORDER — METFORMIN HCL 500 MG PO TABS
1000.0000 mg | ORAL_TABLET | Freq: Two times a day (BID) | ORAL | Status: DC
  Filled 2013-12-07 (×4): qty 2

## 2013-12-07 MED ORDER — CARVEDILOL 25 MG PO TABS
25.0000 mg | ORAL_TABLET | Freq: Two times a day (BID) | ORAL | Status: DC
  Administered 2013-12-07 – 2013-12-08 (×2): 25 mg via ORAL
  Filled 2013-12-07 (×4): qty 1

## 2013-12-07 MED ORDER — LIVING WELL WITH DIABETES BOOK
1.0000 | Freq: Once | Status: DC
  Filled 2013-12-07: qty 1

## 2013-12-07 MED ORDER — FENTANYL CITRATE 0.05 MG/ML IJ SOLN
INTRAMUSCULAR | Status: AC
  Filled 2013-12-07: qty 2

## 2013-12-07 MED ORDER — HEPARIN (PORCINE) IN NACL 2-0.9 UNIT/ML-% IJ SOLN
INTRAMUSCULAR | Status: AC
  Filled 2013-12-07: qty 1000

## 2013-12-07 MED ORDER — PANTOPRAZOLE SODIUM 40 MG PO TBEC
40.0000 mg | DELAYED_RELEASE_TABLET | Freq: Every day | ORAL | Status: DC
  Administered 2013-12-08: 10:00:00 40 mg via ORAL
  Filled 2013-12-07: qty 1

## 2013-12-07 MED ORDER — HEPARIN SODIUM (PORCINE) 1000 UNIT/ML IJ SOLN
INTRAMUSCULAR | Status: AC
  Filled 2013-12-07: qty 1

## 2013-12-07 MED ORDER — ASPIRIN 81 MG PO CHEW: 81.0000 mg | CHEWABLE_TABLET | Freq: Every day | ORAL | Status: DC

## 2013-12-07 MED ORDER — INSULIN ASPART 100 UNIT/ML ~~LOC~~ SOLN
0.0000 [IU] | Freq: Three times a day (TID) | SUBCUTANEOUS | Status: DC
  Administered 2013-12-08: 5 [IU] via SUBCUTANEOUS

## 2013-12-07 MED ORDER — CLOPIDOGREL BISULFATE 75 MG PO TABS: 75.0000 mg | ORAL_TABLET | Freq: Every day | ORAL | Status: DC

## 2013-12-07 MED ORDER — ASPIRIN 81 MG PO CHEW: 81.0000 mg | CHEWABLE_TABLET | ORAL | Status: DC

## 2013-12-07 MED ORDER — MORPHINE SULFATE 2 MG/ML IJ SOLN: 2.0000 mg | INTRAMUSCULAR | Status: DC | PRN

## 2013-12-07 MED ORDER — SODIUM CHLORIDE 0.9 % IV SOLN
INTRAVENOUS | Status: AC
  Administered 2013-12-07: 10:00:00 via INTRAVENOUS

## 2013-12-07 MED ORDER — MIDAZOLAM HCL 2 MG/2ML IJ SOLN
INTRAMUSCULAR | Status: AC
  Filled 2013-12-07: qty 2

## 2013-12-07 NOTE — Progress Notes (Signed)
Site area: right groin  Site Prior to Removal:  Level 0  Pressure Applied For 30 MINUTES    Minutes Beginning at 1050  Manual:   yes  Patient Status During Pull:  AAO X4  Post Pull Groin Site:  Level 0  Post Pull Instructions Given:  yes  Post Pull Pulses Present:  yes  Dressing Applied:  yes  Comments:  Tolerated procedure well

## 2013-12-07 NOTE — H&P (View-Only) (Signed)
11/27/2013 Dennis ModenaGeorge [name]   1946-03-26  [id number]  Primary Physician Stanford Scotlandavid S Jackson, MD Primary Cardiologist: Runell GessJonathan J. Nevin Grizzle MD Roseanne RenoFACP,FACC,FAHA, FSCAI    HPI:  .[name] is a 68 year old married Caucasian male father of one, grandmother, grandfather is accompanied by his wife today. [facility] for evaluation of claudication and arterial Doppler studies which were obtained in our office 04/07/13. His cardiovascular risk factors include type 2 diabetes, hypertension, and hyperlipidemia. He has a 50-100-pack-year history of tobacco abuse currently smoking one pack to 2 packs a day. There is no family history of heart disease. He's never had a heart attack or stroke. He does complain of dyspnea on exertion. He has had claudication for the last 2 years worse over the last 3 months which is now lifestyle limiting. Doppler studies in our office performed 04/07/13 revealed a right ABI of 0.63 and a left ABI of 0.70. He had high-grade SFA disease bilaterally as well as tibial disease.because of a positive Myoview stress test the patient first had a diagnostic coronary arteriogram. Which showed 3 vessel disease with moderate LV dysfunction. He'll underwent coronary bypass grafting x4 by Dr. Rexanne ManoBrian [name] on 05/12/13 with a sequential LIMA to the LAD and diagonal branch, vein to obtuse marginal branch and to the PDA. His postop course was uncomplicated. Following his coronary artery bypass graft surgery he underwent staged bilateral superficial femoral artery directional atherectomy with marked improvement in his claudication and arterial Doppler studies. This week he developed shortness of breath and was seen in an emergency room and diagnosed with congestive heart failure. He was treated with Lasix and referred back to us for further evaluation. He saw Dennis MediciBrittany Simmons PA-C in our office yesterday who did a 2-D echocardiogram today that revealed an EF of 20-25%. This is a  finding now almost 3 months status post coronary revascularization.  He is currently wearing a LifeVest. A recent 2-D echo revealed an ejection fraction of 20-25% with global hypokinesia. A Myoview stress test showed inferior scar. [name] underwent cardiac catheterization by myself/5/14 with a high-grade stenosis within his PDA graft was stented. He continues to wear a life vest with a 2-D echo scheduled several weeks now to determine whether he's had improvement in LV function and, whether he can take off his life vest or needs to transition to an ICD.  He saw Dennis [name] PAC in the office last week . Bilateral lower extremity pain similar to this preintervention symptoms. Followup Dopplers confirmed recurrent disease.on 11/16/13 he underwent re\re angiography, TurboHAWK directional arthrectomy, PTA of distal right SFA with drug-eluting balloon. He had a excellent angiographic and clinical result. He has residual disease in the left left lower extremity lifestyle limiting claudication.  Current Outpatient Prescriptions  Medication Sig Dispense Refill  . acetaminophen (TYLENOL) 325 MG tablet Take 2 tablets (650 mg total) by mouth every 4 (four) hours as needed for headache or mild pain.      Dennis [name]. amLODipine (NORVASC) 2.5 MG tablet Take 1 tablet (2.5 mg total) by mouth daily.  30 tablet  5  . aspirin EC 81 MG tablet Take 1 tablet (81 mg total) by mouth daily.  90 tablet  3  . carvedilol (COREG) 25 MG tablet Take 1 tablet (25 mg total) by mouth 2 (two) times daily.  60 tablet  6  . cetirizine (ZYRTEC) 10 MG tablet Take 10 mg by mouth daily as needed.       .Dennis [name]  clopidogrel (PLAVIX) 75 MG tablet Take 1 tablet (75 mg total) by mouth daily.  30 tablet  1  . furosemide (LASIX) 20 MG tablet Take 1 tablet (20 mg total) by mouth 2 (two) times daily.  75 tablet  6  . glipiZIDE (GLUCOTROL XL) 10 MG 24 hr tablet Take 10 mg by mouth daily with breakfast.       . lisinopril (PRINIVIL,ZESTRIL) 40 MG tablet Take 40 mg  by mouth daily.       Dennis [name] living well with diabetes book MISC 1 each by Does not apply route once.      . metFORMIN (GLUCOPHAGE) 1000 MG tablet Take 1 tablet (1,000 mg total) by mouth 2 (two) times daily with a meal.  60 tablet  1  . pantoprazole (PROTONIX) 40 MG tablet Take 1 tablet (40 mg total) by mouth daily.  30 tablet  11  . pravastatin (PRAVACHOL) 40 MG tablet Take 40 mg by mouth daily.       . sitaGLIPtin-metformin (JANUMET) 50-1000 MG per tablet Take 1 tablet by mouth 2 (two) times daily with a meal.        No current facility-administered medications for this visit.    Allergies  Allergen Reactions  . Chlorhexidine Rash    History   Social History  . Marital Status: Married    Spouse Name: N/A    Number of Children: N/A  . Years of Education: N/A   Occupational History  . Not on file.   Social History Main Topics  . Smoking status: Former Smoker -- 1.00 packs/day for 55 years    Types: Cigarettes    Quit date: 05/05/2013  . Smokeless tobacco: Never Used     Comment: 07/07/2013 now using E- Cig.  . Alcohol Use: No     Comment: 07/07/2013 "I'll have a beer once in a blue moon"  . Drug Use: No  . Sexual Activity: Not Currently   Other Topics Concern  . Not on file   Social History Narrative  . No narrative on file     Review of Systems: General: negative for chills, fever, night sweats or weight changes.  Cardiovascular: negative for chest pain, dyspnea on exertion, edema, orthopnea, palpitations, paroxysmal nocturnal dyspnea or shortness of breath Dermatological: negative for rash Respiratory: negative for cough or wheezing Urologic: negative for hematuria Abdominal: negative for nausea, vomiting, diarrhea, bright red blood per rectum, melena, or hematemesis Neurologic: negative for visual changes, syncope, or dizziness All other systems reviewed and are otherwise negative except as noted above.    Blood pressure 102/60, pulse 64, height 5' 5.5" (1.664  m), weight 208 lb (94.348 kg).  General appearance: alert and no distress Neck: no adenopathy, no carotid bruit, no JVD, supple, symmetrical, trachea midline and thyroid not enlarged, symmetric, no tenderness/mass/nodules Lungs: clear to auscultation bilaterally Heart: regular rate and rhythm, S1, S2 normal, no murmur, click, rub or gallop Extremities: extremities normal, atraumatic, no cyanosis or edema and the left femoral arterial puncture site is well-healed  EKG not performed today  ASSESSMENT AND PLAN:   PVD - bilat SFA disease - s/p PCTA of right SFA 06/30/13; staged PCI to prox & mid third of Lt SFA Turbohawk arthrectomy 07/07/13 [name] recently underwent TurboHawk directional atherectomy and drug-eluting balloon angioplasty of a high-grade distal right SFA stenosis on 11/16/13 with an excellent angiographic and clinical result. He does have one vessel runoff bilaterally via peroneal arteries. He has a high-grade proximal left SFA stenosis,  otherwise diffuse left SFA disease as well as a 75% left tibial peroneal trunk stenosis. The plan is to bring him back for [facility] directional atherectomy of the proximal left SFA post MI was tibial perineal trunk intervention lifestyle limiting claudication.  Hyperlipidemia On statin therapy not at goal. We will add Zetia 10 mg daily  Essential hypertension Well-controlled on current medications      [name] MD [facility], [facility] 11/27/2013 10:03 AM

## 2013-12-07 NOTE — CV Procedure (Signed)
Dennis [name] is a 68 y.o. male    [id number] LOCATION:  FACILITY: [facility]  PHYSICIAN: Nanetta Batty, M.D. 10/26/1945   DATE OF PROCEDURE:  12/07/2013  DATE OF DISCHARGE:     PV Angiogram/Intervention    History obtained from chart review.[name] is a 68 year old married Caucasian male father of one, grandmother, grandfather is accompanied by his wife today. He was referred by [facility] at [facility] for evaluation of claudication and arterial Doppler studies which were obtained in our office 04/07/13. His cardiovascular risk factors include type 2 diabetes, hypertension, and hyperlipidemia. He has a 50-100-pack-year history of tobacco abuse currently smoking one pack to 2 packs a day. There is no family history of heart disease. He's never had a heart attack or stroke. He does complain of dyspnea on exertion. He has had claudication for the last 2 years worse over the last 3 months which is now lifestyle limiting. Doppler studies in our office performed 04/07/13 revealed a right ABI of 0.63 and a left ABI of 0.70. He had high-grade SFA disease bilaterally as well as tibial disease.because of a positive Myoview stress test the patient first had a diagnostic coronary arteriogram. Which showed 3 vessel disease with moderate LV dysfunction. He'll underwent coronary bypass grafting x4 by Dr. [name] on 05/12/13 with a sequential LIMA to the LAD and diagonal branch, vein to obtuse marginal branch and to the PDA. His postop course was uncomplicated. Following his coronary artery bypass graft surgery he underwent staged bilateral superficial femoral artery directional atherectomy with marked improvement in his claudication and arterial Doppler studies. This week he developed shortness of breath and was seen in an emergency room and diagnosed with congestive heart failure. He was treated with Lasix and referred back to [place] for further evaluation. He saw [name] in our office yesterday who  did a 2-D echocardiogram today that revealed an EF of 20-25%. This is a finding now almost 3 months status post coronary revascularization.  He is currently wearing a LifeVest. A recent 2-D echo revealed an ejection fraction of 20-25% with global hypokinesia. A Myoview stress test showed inferior scar. [name] underwent cardiac catheterization by myself/5/14 with a high-grade stenosis within his PDA graft was stented. He continues to wear a life vest with a 2-D echo scheduled several weeks now to determine whether he's had improvement in LV function and, whether he can take off his life vest or needs to transition to an ICD.  He saw Boyce Medici PAC in the office last week . Bilateral lower extremity pain similar to this preintervention symptoms. Followup Dopplers confirmed recurrent disease.on 11/16/13 he underwent re\re angiography, TurboHAWK directional arthrectomy, PTA of distal right SFA with drug-eluting balloon. He had a excellent angiographic and clinical result. He has residual disease in the left left lower extremity With lifestyle limiting claudication. He presents today for proximal left SFA turbo hawk directional atherectomy.    PROCEDURE DESCRIPTION:   The patient was brought to the second floor [place] [facility]iac cath lab in the postabsorptive state. He was premedicated with Valium 5 mg by mouth, IV Versed and fentanyl. His right groinwas prepped and shaved in usual sterile fashion. Xylocaine 1% was used for local anesthesia. A 7 French 45 cm destination sheath was inserted into the right common femoral artery using standard Seldinger technique.contralateral access was obtained with a 5 [place] crossover catheter, 0.35 glide wire, 0.35 Rosen wire. Visipaque was used for the entirety of the case. Retrograde aortic pressure  was monitored during the case.   HEMODYNAMICS:    AO SYSTOLIC/AO DIASTOLIC: 94/50   Angiographic Data:   The patient received a total of 9000 units of venous  heparin with a peak ACT of 232 and ending ACT of 199. Total contrast administered the patient was 106 cc. The sheath was placed at the level of the left common femoral artery. The lesion was crossed with a 0.14/190 cm long [place] core wire and a 6 mm Spider distal protection device was then deployed in the adductor canal. Following this, a LXS directional atherectomy device was used to perform multiple circumferential cots the proximal left SFA lesion. A copious amount of atherosclerotic plaque was withdrawn. The distal protection device was then captured and completion angiography performed revealing one-vessel runoff via the peroneal is a widely patent proximal atherectomy this site with 60% segmental mid left SFA stenosis as well as P2 stenosis. The sheath was then withdrawn across the bifurcation and exchanged for a short 7 JamaicaFrench sheath.  IMPRESSION:successful turbo hawk directional atherectomy high-grade proximal left SFA stenosis for lifestyle limiting claudication. The patient has residual segmental mid left SFA disease as well as popliteal disease with one vessel runoff. The sheath will be removed and pressure held on the groin once the ACT falls below 170. He'll be hydrated overnight and treated with dual antiplatelet therapy. He'll be discharged tomorrow morning, get followup lower extremity arterial Doppler studies in our [place] office after which I will see him back.    Runell GessBERRY,Easter Schinke J. MD, [facility] 12/07/2013 9:13 AM

## 2013-12-07 NOTE — Progress Notes (Signed)
Inpatient Diabetes Program Recommendations  AACE/ADA: New Consensus Statement on Inpatient Glycemic Control (2013)  Target Ranges:  Prepandial:   less than 140 mg/dL      Peak postprandial:   less than 180 mg/dL (1-2 hours)      Critically ill patients:  140 - 180 mg/dL     Results for Dennis [name], Dennis [name] (MRN [id number]) as of 12/07/2013 13:54  Ref. Range 05/08/2013 15:05  Hemoglobin A1C Latest Range: <5.7 % 12.4 (H)   Results for Dennis [name], Dennis [name] (MRN [id number]) as of 12/07/2013 13:54  Ref. Range 12/07/2013 05:59 12/07/2013 09:41  Glucose-Capillary Latest Range: 70-99 mg/dL 782299 (H) 956250 (H)   Recommend starting Lantus 15 units and Novolog moderate scale TID + HS during hospitalization. Needs follow up with PCP for glycemic management.  Insulin may need to be considered if A1C has not improved since last August.  Thank you  Piedad ClimesGina Shawan Corella BSN, RN,CDE Inpatient Diabetes Coordinator [phone] (team pager)

## 2013-12-07 NOTE — Interval H&P Note (Signed)
History and Physical Interval Note:  12/07/2013 7:37 AM  Dennis ModenaGeorge Bell  has presented today for surgery, with the diagnosis of pad  The various methods of treatment have been discussed with the patient and family. After consideration of risks, benefits and other options for treatment, the patient has consented to  Procedure(s): LOWER EXTREMITY ANGIOGRAM (N/A) as a surgical intervention .  The patient's history has been reviewed, patient examined, no change in status, stable for surgery.  I have reviewed the patient's chart and labs.  Questions were answered to the patient's satisfaction.     Runell GessBERRY,Indianna Boran J

## 2013-12-08 ENCOUNTER — Other Ambulatory Visit: Payer: Self-pay | Admitting: Physician Assistant

## 2013-12-08 DIAGNOSIS — M79609 Pain in unspecified limb: Secondary | ICD-10-CM

## 2013-12-08 DIAGNOSIS — R9439 Abnormal result of other cardiovascular function study: Secondary | ICD-10-CM

## 2013-12-08 DIAGNOSIS — I739 Peripheral vascular disease, unspecified: Secondary | ICD-10-CM

## 2013-12-08 DIAGNOSIS — E785 Hyperlipidemia, unspecified: Secondary | ICD-10-CM

## 2013-12-08 DIAGNOSIS — I2510 Atherosclerotic heart disease of native coronary artery without angina pectoris: Secondary | ICD-10-CM

## 2013-12-08 DIAGNOSIS — I10 Essential (primary) hypertension: Secondary | ICD-10-CM

## 2013-12-08 LAB — CBC
HEMATOCRIT: 30.4 % — AB (ref 39.0–52.0)
Hemoglobin: 10 g/dL — ABNORMAL LOW (ref 13.0–17.0)
MCH: 25.4 pg — AB (ref 26.0–34.0)
MCHC: 32.9 g/dL (ref 30.0–36.0)
MCV: 77.2 fL — ABNORMAL LOW (ref 78.0–100.0)
Platelets: 205 10*3/uL (ref 150–400)
RBC: 3.94 MIL/uL — AB (ref 4.22–5.81)
RDW: 13.9 % (ref 11.5–15.5)
WBC: 7 10*3/uL (ref 4.0–10.5)

## 2013-12-08 LAB — BASIC METABOLIC PANEL
BUN: 22 mg/dL (ref 6–23)
CALCIUM: 8.8 mg/dL (ref 8.4–10.5)
CO2: 22 mEq/L (ref 19–32)
Chloride: 102 mEq/L (ref 96–112)
Creatinine, Ser: 0.85 mg/dL (ref 0.50–1.35)
GFR calc Af Amer: >90 mL/min (ref >90)
GFR, EST NON AFRICAN AMERICAN: 88 mL/min — AB (ref >90)
GLUCOSE: 286 mg/dL — AB (ref 70–99)
Potassium: 4.9 mEq/L (ref 3.7–5.3)
SODIUM: 138 meq/L (ref 137–147)

## 2013-12-08 LAB — GLUCOSE, CAPILLARY: Glucose-Capillary: 267 mg/dL — ABNORMAL HIGH (ref 70–99)

## 2013-12-08 MED ORDER — METFORMIN HCL 1000 MG PO TABS: 1000.0000 mg | ORAL_TABLET | Freq: Two times a day (BID) | ORAL | Status: DC

## 2013-12-08 MED ORDER — SITAGLIPTIN-METFORMIN HCL 50-1000 MG PO TABS
1.0000 | ORAL_TABLET | Freq: Two times a day (BID) | ORAL | Status: DC
Start: 2013-12-08 — End: 2014-06-22

## 2013-12-08 NOTE — Discharge Summary (Signed)
   CARDIOLOGY DISCHARGE SUMMARY   Patient ID: Dennis [name] MRN: [id number] DOB/AGE: 69/12/1945 67 y.o.  Admit date: 12/07/2013 Discharge date: 12/08/2013  PCP: David S Jackson, MD Primary Cardiologist: Dr. Berry  Primary Discharge Diagnosis: Claudication, class III  Secondary Discharge Diagnosis:    Essential hypertension   Type 2 diabetes mellitus not at goal   Tobacco abuse   Anemia - minor blood loss, likely secondary to blood draws and procedures  Procedures: Peripheral vascular angiogram, turbo hawk directional atherectomy  Hospital Course: Dennis [name] is a 67 y.o. male with a history of CAD and PVD. He had recent PTA of the right SFA and was seen in the office where he was still having lifestyle-limiting claudication symptoms. He was scheduled for atherectomy of the proximal left SFA. He came to the hospital for the procedure on 3/23.  Procedure results are listed below. He had successful turbo hawk directional atherectomy high-grade proximal left SFA stenosis.  He tolerated the procedure well and the sheath was removed without difficulty. He was seen by the diabetes coordinator who made suggestions on managing his blood sugars. His home medications for hypertension were continued. He was counseled on smoking cessation and encouraged to quit. He was noted to have some anemia post-procedure, and this will be followed as an outpatient.  On 3/24, he was seen by Dr. Harding. He was ambulating without chest pain or shortness of breath and feeling much better. Dr. Harding evaluated Dennis [name] and reviewed all data. No further inpatient workup is indicated and Dennis [name] is considered stable for discharge, to followup as an outpatient.  Labs:  Lab Results  Component Value Date   WBC 7.0 12/08/2013   HGB 10.0* 12/08/2013   HCT 30.4* 12/08/2013   MCV 77.2* 12/08/2013   PLT 205 12/08/2013     Recent Labs Lab 12/08/13 0415  NA 138  K 4.9  CL 102  CO2 22  BUN 22  CREATININE 0.85    CALCIUM 8.8  GLUCOSE 286*    PV Cath: 12/07/2013 IMPRESSION:successful turbo hawk directional atherectomy high-grade proximal left SFA stenosis for lifestyle limiting claudication. The patient has residual segmental mid left SFA disease as well as popliteal disease with one vessel runoff. The sheath will be removed and pressure held on the groin once the ACT falls below 170. He'll be hydrated overnight and treated with dual antiplatelet therapy. He'll be discharged tomorrow morning, get followup lower extremity arterial Doppler studies in our [place] office after which I will see him back.  FOLLOW UP PLANS AND APPOINTMENTS Allergies  Allergen Reactions  . Chlorhexidine Rash     Medication List         acetaminophen 325 MG tablet  Commonly known as:  TYLENOL  Take 2 tablets (650 mg total) by mouth every 4 (four) hours as needed for headache or mild pain.     amLODipine 2.5 MG tablet  Commonly known as:  NORVASC  Take 1 tablet (2.5 mg total) by mouth daily.     aspirin EC 81 MG tablet  Take 1 tablet (81 mg total) by mouth daily.     carvedilol 25 MG tablet  Commonly known as:  COREG  Take 1 tablet (25 mg total) by mouth 2 (two) times daily.     cetirizine 10 MG tablet  Commonly known as:  ZYRTEC  Take 10 mg by mouth daily as needed.     clopidogrel 75 MG tablet  Commonly known as:  PLAVIX  Take   1 tablet (75 mg total) by mouth daily.     ezetimibe 10 MG tablet  Commonly known as:  ZETIA  Take 1 tablet (10 mg total) by mouth daily.     furosemide 20 MG tablet  Commonly known as:  LASIX  Take 20 mg by mouth 2 (two) times daily as needed for fluid.     glipiZIDE 10 MG 24 hr tablet  Commonly known as:  GLUCOTROL XL  Take 10 mg by mouth daily with breakfast.     lisinopril 40 MG tablet  Commonly known as:  PRINIVIL,ZESTRIL  Take 40 mg by mouth daily.     living well with diabetes book Misc  1 each by Does not apply route once.     metFORMIN 1000 MG tablet   Commonly known as:  GLUCOPHAGE  Take 1 tablet (1,000 mg total) by mouth 2 (two) times daily with a meal. HOLD for 2 days, restart on 12/10/2013     pantoprazole 40 MG tablet  Commonly known as:  PROTONIX  Take 1 tablet (40 mg total) by mouth daily.     pravastatin 40 MG tablet  Commonly known as:  PRAVACHOL  Take 40 mg by mouth daily.     sitaGLIPtin-metformin 50-1000 MG per tablet  Commonly known as:  JANUMET  Take 1 tablet by mouth 2 (two) times daily with a meal. HOLD for 2 days, restart on 12/10/2013.        Discharge Orders   Future Appointments Provider Department Dept Phone   12/11/2013 1:00 PM [facility] [address] [facility] [address] [phone]   12/30/2013 4:00 PM Jonathan J Berry, MD [facility] [phone]   Future Orders Complete By Expires   Diet - low sodium heart healthy  As directed    Diet Carb Modified  As directed    Increase activity slowly  As directed        BRING ALL MEDICATIONS WITH YOU TO FOLLOW UP APPOINTMENTS  Time spent with patient to include physician time: 38 min Signed: Rhonda Barrett, PA-C 12/08/2013, 9:49 AM Co-Sign MD  Pt seen & examined on AM of D/C.   Stable to d/c with ROV as scheduled.  HARDING,DAVID W, MD    

## 2013-12-08 NOTE — Progress Notes (Signed)
CARDIOLOGY DISCHARGE SUMMARY   Patient ID: Dennis [name] MRN: [id number] DOB/AGE: 1945-11-26 68 y.o.  Admit date: 12/07/2013 Discharge date: 12/08/2013  PCP: Dennis Scotlandavid S Jackson, MD Primary Cardiologist: Dennis [name]  Primary Discharge Diagnosis: Claudication, class III  Secondary Discharge Diagnosis:    Essential hypertension   Type 2 diabetes mellitus not at goal   Tobacco abuse   Anemia - minor blood loss, likely secondary to blood draws and procedures  Procedures: Peripheral vascular angiogram, turbo hawk directional atherectomy  Hospital Course: Dennis [name] is a 68 y.o. male with a history of CAD and PVD. He had recent PTA of the right SFA and was seen in the office where he was still having lifestyle-limiting claudication symptoms. He was scheduled for atherectomy of the proximal left SFA. He came to the hospital for the procedure on 3/23.  Procedure results are listed below. He had successful turbo hawk directional atherectomy high-grade proximal left SFA stenosis.  He tolerated the procedure well and the sheath was removed without difficulty. He was seen by the diabetes coordinator who made suggestions on managing his blood sugars. His home medications for hypertension were continued. He was counseled on smoking cessation and encouraged to quit. He was noted to have some anemia post-procedure, and this will be followed as an outpatient.  On 3/24, he was seen by Dennis [name]. He was ambulating without chest pain or shortness of breath and feeling much better. Dennis [name] evaluated Dennis [name] and reviewed all data. No further inpatient workup is indicated and Dennis [name] is considered stable for discharge, to followup as an outpatient.  Labs:  Lab Results  Component Value Date   WBC 7.0 12/08/2013   HGB 10.0* 12/08/2013   HCT 30.4* 12/08/2013   MCV 77.2* 12/08/2013   PLT 205 12/08/2013     Recent Labs Lab 12/08/13 0415  NA 138  K 4.9  CL 102  CO2 22  BUN 22  CREATININE 0.85    CALCIUM 8.8  GLUCOSE 286*    PV Cath: 12/07/2013 IMPRESSION:successful turbo hawk directional atherectomy high-grade proximal left SFA stenosis for lifestyle limiting claudication. The patient has residual segmental mid left SFA disease as well as popliteal disease with one vessel runoff. The sheath will be removed and pressure held on the groin once the ACT falls below 170. He'll be hydrated overnight and treated with dual antiplatelet therapy. He'll be discharged tomorrow morning, get followup lower extremity arterial Doppler studies in our [place] office after which I will see him back.  FOLLOW UP PLANS AND APPOINTMENTS Allergies  Allergen Reactions  . Chlorhexidine Rash     Medication List         acetaminophen 325 MG tablet  Commonly known as:  TYLENOL  Take 2 tablets (650 mg total) by mouth every 4 (four) hours as needed for headache or mild pain.     amLODipine 2.5 MG tablet  Commonly known as:  NORVASC  Take 1 tablet (2.5 mg total) by mouth daily.     aspirin EC 81 MG tablet  Take 1 tablet (81 mg total) by mouth daily.     carvedilol 25 MG tablet  Commonly known as:  COREG  Take 1 tablet (25 mg total) by mouth 2 (two) times daily.     cetirizine 10 MG tablet  Commonly known as:  ZYRTEC  Take 10 mg by mouth daily as needed.     clopidogrel 75 MG tablet  Commonly known as:  PLAVIX  Take  1 tablet (75 mg total) by mouth daily.     ezetimibe 10 MG tablet  Commonly known as:  ZETIA  Take 1 tablet (10 mg total) by mouth daily.     furosemide 20 MG tablet  Commonly known as:  LASIX  Take 20 mg by mouth 2 (two) times daily as needed for fluid.     glipiZIDE 10 MG 24 hr tablet  Commonly known as:  GLUCOTROL XL  Take 10 mg by mouth daily with breakfast.     lisinopril 40 MG tablet  Commonly known as:  PRINIVIL,ZESTRIL  Take 40 mg by mouth daily.     living well with diabetes book Misc  1 each by Does not apply route once.     metFORMIN 1000 MG tablet   Commonly known as:  GLUCOPHAGE  Take 1 tablet (1,000 mg total) by mouth 2 (two) times daily with a meal. HOLD for 2 days, restart on 12/10/2013     pantoprazole 40 MG tablet  Commonly known as:  PROTONIX  Take 1 tablet (40 mg total) by mouth daily.     pravastatin 40 MG tablet  Commonly known as:  PRAVACHOL  Take 40 mg by mouth daily.     sitaGLIPtin-metformin 50-1000 MG per tablet  Commonly known as:  JANUMET  Take 1 tablet by mouth 2 (two) times daily with a meal. HOLD for 2 days, restart on 12/10/2013.        Discharge Orders   Future Appointments Provider Department Dept Phone   12/11/2013 1:00 PM [facility] [address] [facility] [address] [phone]   12/30/2013 4:00 PM [name], MD [facility] [place] [phone]   Future Orders Complete By Expires   Diet - low sodium heart healthy  As directed    Diet Carb Modified  As directed    Increase activity slowly  As directed        BRING ALL MEDICATIONS WITH YOU TO FOLLOW UP APPOINTMENTS  Time spent with patient to include physician time: 38 min Signed: Theodore Demark, PA-C 12/08/2013, 9:49 AM Co-Sign MD  Pt seen & examined on AM of D/C.   Stable to d/c with ROV as scheduled.  Dennis Lex, MD

## 2013-12-08 NOTE — Progress Notes (Signed)
     Patient Name: Dennis [name] Date of Encounter: 12/08/2013  Active Problems:   Claudication    SUBJECTIVE: Walking in halls, and generally feels very well. No chest pain or shortness of breath  OBJECTIVE Filed Vitals:   12/07/13 1600 12/07/13 2016 12/08/13 0014 12/08/13 0444  BP: 155/77 120/55 134/61 146/53  Pulse: 58 80 81 84  Temp:  98.5 F (36.9 C) 98.3 F (36.8 C) 97.8 F (36.6 C)  TempSrc:  Oral Oral Oral  Resp:  17 16 16   Height:      Weight:   200 lb 9.9 oz (91 kg)   SpO2: 98% 95% 96% 98%    Intake/Output Summary (Last 24 hours) at 12/08/13 0725 Last data filed at 12/07/13 2300  Gross per 24 hour  Intake    720 ml  Output    830 ml  Net   -110 ml   Filed Weights   12/07/13 0546 12/08/13 0014  Weight: 200 lb (90.719 kg) 200 lb 9.9 oz (91 kg)    PHYSICAL EXAM General: Well developed, well nourished, male in no acute distress. Head: Normocephalic, atraumatic.  Neck: Supple without bruits, JVD not elevated. Lungs:  Resp regular and unlabored, CTA bilaterally. Heart: RRR, S1, S2, no S3, S4, or murmur; no rub. Abdomen: Soft, non-tender, non-distended, BS + x 4.  Extremities: No clubbing, cyanosis, no edema. Right groin cath site without ecchymosis, bruit or hematoma Neuro: Alert and oriented X 3. Moves all extremities spontaneously. Psych: Normal affect.  LABS: CBC: Recent Labs  12/08/13 0415  WBC 7.0  HGB 10.0*  HCT 30.4*  MCV 77.2*  PLT 205   Basic Metabolic Panel: Recent Labs  12/08/13 0415  NA 138  K 4.9  CL 102  CO2 22  GLUCOSE 286*  BUN 22  CREATININE 0.85  CALCIUM 8.8   TELE:   Sinus rhythm, occasional PVCs  Current Medications:  . amLODipine  2.5 mg Oral Daily  . aspirin EC  81 mg Oral Daily  . carvedilol  25 mg Oral BID  . clopidogrel  75 mg Oral Q breakfast  . ezetimibe  10 mg Oral QPC supper  . glipiZIDE  10 mg Oral Q breakfast  . insulin aspart  0-9 Units Subcutaneous TID WC  . insulin aspart  0-9 Units Subcutaneous  TID WC  . linagliptin  5 mg Oral Daily   And  . metFORMIN  1,000 mg Oral BID WC  . lisinopril  40 mg Oral Daily  . living well with diabetes book  1 each Does not apply Once  . loratadine  10 mg Oral Daily  . pantoprazole  40 mg Oral Daily      ASSESSMENT AND PLAN: Active Problems:   Claudication - successful turbo hawk directional atherectomy high-grade proximal left SFA stenosis for lifestyle limiting claudication; patient much improved today and ambulated without difficulty.    Hypertension - SBP 120s to 150s on current medications, home Lasix is on hold. Restart after discharge and follow  Plan: DC today and followup as an outpatient.   Signed, Theodore DemarkRhonda Barrett , PA-C 7:25 AM 12/08/2013  Pt. Seen & examined along with Ms. Raford PitcherBarrett, PA-C.  I agree with her findings, exam & A/P.  Stable post LSFA PTA - no claudication.  OK for d/c with ROV to see Dr. Jeni SallesJB  Soriyah Osberg W, MD

## 2013-12-09 ENCOUNTER — Telehealth (HOSPITAL_COMMUNITY): Payer: Self-pay | Admitting: *Deleted

## 2013-12-11 ENCOUNTER — Ambulatory Visit (HOSPITAL_COMMUNITY)
Admission: RE | Admit: 2013-12-11 | Discharge: 2013-12-11 | Disposition: A | Discharge status: Home / Self Care | Payer: Managed Care, Other (non HMO) | Source: Ambulatory Visit | Attending: Internal Medicine | Admitting: Internal Medicine

## 2013-12-11 DIAGNOSIS — I739 Peripheral vascular disease, unspecified: Secondary | ICD-10-CM | POA: Insufficient documentation

## 2013-12-11 NOTE — Progress Notes (Signed)
Bilateral Lower Extremity Arterial Duplex Completed. °Brianna L Mazza,RVT °

## 2013-12-15 ENCOUNTER — Telehealth: Payer: Self-pay | Admitting: *Deleted

## 2013-12-15 DIAGNOSIS — I739 Peripheral vascular disease, unspecified: Secondary | ICD-10-CM

## 2013-12-15 NOTE — Telephone Encounter (Signed)
Message copied by Marella BileVOGEL, Shayana Hornstein W. on Tue Dec 15, 2013 10:48 AM ------      Message from: Runell GessBERRY, JONATHAN J      Created: Mon Dec 14, 2013  6:57 PM       Improved s/p LSFA intervention. Repeat in 6 months ------

## 2013-12-15 NOTE — Telephone Encounter (Signed)
Order placed for repeat lower extremity arterial doppler in 6 months  

## 2013-12-30 ENCOUNTER — Encounter: Payer: Self-pay | Admitting: Cardiovascular Disease

## 2013-12-30 ENCOUNTER — Ambulatory Visit (INDEPENDENT_AMBULATORY_CARE_PROVIDER_SITE_OTHER): Payer: Managed Care, Other (non HMO) | Admitting: Cardiovascular Disease

## 2013-12-30 VITALS — BP 152/86 | HR 84 | Ht 69.5 in | Wt 212.0 lb

## 2013-12-30 DIAGNOSIS — I2510 Atherosclerotic heart disease of native coronary artery without angina pectoris: Secondary | ICD-10-CM

## 2013-12-30 DIAGNOSIS — Z79899 Other long term (current) drug therapy: Secondary | ICD-10-CM

## 2013-12-30 DIAGNOSIS — E785 Hyperlipidemia, unspecified: Secondary | ICD-10-CM

## 2013-12-30 DIAGNOSIS — I10 Essential (primary) hypertension: Secondary | ICD-10-CM

## 2013-12-30 DIAGNOSIS — I739 Peripheral vascular disease, unspecified: Secondary | ICD-10-CM

## 2013-12-30 NOTE — Patient Instructions (Signed)
  We will see you back in follow up in 6 months with Dr Allyson SabalBerry  Dr Allyson SabalBerry has ordered blood work to be done, fasting

## 2013-12-30 NOTE — Assessment & Plan Note (Signed)
In addition to his pravastatin I added Zetia to his medical regimen and we'll recheck a lipid and liver profile in 2 months

## 2013-12-30 NOTE — Progress Notes (Signed)
12/30/2013 Dennis [name]   09/16/1946  [id number]  Primary Physician Stanford Scotlandavid S Jackson, MD Primary Cardiologist: Runell GessJonathan J. Tyrelle Raczka MD Roseanne RenoFACP,FACC,FAHA, FSCAI   HPI:  .Dennis [name] is a 68 year old married Caucasian male father of one, grandmother, grandfather is accompanied by his wife today. [facility] for evaluation of claudication and arterial Doppler studies which were obtained in our office 04/07/13. His cardiovascular risk factors include type 2 diabetes, hypertension, and hyperlipidemia. He has a 50-100-pack-year history of tobacco abuse currently smoking one pack to 2 packs a day. There is no family history of heart disease. He's never had a heart attack or stroke. He does complain of dyspnea on exertion. He has had claudication for the last 2 years worse over the last 3 months which is now lifestyle limiting. Doppler studies in our office performed 04/07/13 revealed a right ABI of 0.63 and a left ABI of 0.70. He had high-grade SFA disease bilaterally as well as tibial disease.because of a positive Myoview stress test the patient first had a diagnostic coronary arteriogram. Which showed 3 vessel disease with moderate LV dysfunction. He'll underwent coronary bypass grafting x4 by Dr. Rexanne ManoBrian Bartle on 05/12/13 with a sequential LIMA to the LAD and diagonal branch, vein to obtuse marginal branch and to the PDA. His postop course was uncomplicated. Following his coronary artery bypass graft surgery he underwent staged bilateral superficial femoral artery directional atherectomy with marked improvement in his claudication and arterial Doppler studies. This week he developed shortness of breath and was seen in an emergency room and diagnosed with congestive heart failure. He was treated with Lasix and referred back to us for further evaluation. He saw [name] PA-C in our office yesterday who did a 2-D echocardiogram today that revealed an EF of 20-25%. This is a finding  now almost 3 months status post coronary revascularization.  He is currently wearing a LifeVest. A recent 2-D echo revealed an ejection fraction of 20-25% with global hypokinesia. A Myoview stress test showed inferior scar. Dennis [name] underwent cardiac catheterization by myself/5/14 with a high-grade stenosis within his PDA graft was stented. He continues to wear a life vest with a 2-D echo scheduled several weeks now to determine whether he's had improvement in LV function and, whether he can take off his life vest or needs to transition to an ICD.  He saw [name] PAC in the office last week . Bilateral lower extremity pain similar to this preintervention symptoms. Followup Dopplers confirmed recurrent disease.on 11/16/13 he underwent re\re angiography, TurboHAWK directional arthrectomy, PTA of distal right SFA with drug-eluting balloon. He had a excellent angiographic and clinical result. He had residual disease in the left left lower extremity lifestyle limiting claudication.on 12/07/13 he underwent directional atherectomy using block of his proximal left SFA. He does have residual 60% segmental disease in the mid and distal left SFA with one vessel runoff via the perineal. This Dopplers improved with an ABI of up to  0.83 on that side. No longer has claudication.    Current Outpatient Prescriptions  Medication Sig Dispense Refill  . acetaminophen (TYLENOL) 325 MG tablet Take 2 tablets (650 mg total) by mouth every 4 (four) hours as needed for headache or mild pain.      Marland Kitchen. amLODipine (NORVASC) 2.5 MG tablet Take 1 tablet (2.5 mg total) by mouth daily.  30 tablet  5  . aspirin EC 81 MG tablet Take 1 tablet (81 mg total) by  mouth daily.  90 tablet  3  . carvedilol (COREG) 25 MG tablet Take 1 tablet (25 mg total) by mouth 2 (two) times daily.  60 tablet  6  . cetirizine (ZYRTEC) 10 MG tablet Take 10 mg by mouth daily as needed.       . clopidogrel (PLAVIX) 75 MG tablet Take 1 tablet (75 mg total)  by mouth daily.  30 tablet  1  . exenatide (BYETTA 5 MCG PEN) 5 MCG/0.02ML SOPN injection Inject 10 mcg into the skin.      Marland Kitchen ezetimibe (ZETIA) 10 MG tablet Take 1 tablet (10 mg total) by mouth daily.  90 tablet  3  . furosemide (LASIX) 20 MG tablet Take 20 mg by mouth 2 (two) times daily as needed for fluid.      Marland Kitchen glipiZIDE (GLUCOTROL XL) 10 MG 24 hr tablet Take 10 mg by mouth daily with breakfast.       . lisinopril (PRINIVIL,ZESTRIL) 40 MG tablet Take 40 mg by mouth daily.       Marland Kitchen living well with diabetes book MISC 1 each by Does not apply route once.      . pantoprazole (PROTONIX) 40 MG tablet Take 1 tablet (40 mg total) by mouth daily.  30 tablet  11  . pravastatin (PRAVACHOL) 40 MG tablet Take 40 mg by mouth daily.       . sitaGLIPtin-metformin (JANUMET) 50-1000 MG per tablet Take 1 tablet by mouth 2 (two) times daily with a meal. HOLD for 2 days, restart on 12/10/2013.       No current facility-administered medications for this visit.    Allergies  Allergen Reactions  . Other Other (See Comments)    Seasonal  Nasal congestion   . Chlorhexidine Rash    History   Social History  . Marital Status: Married    Spouse Name: N/A    Number of Children: N/A  . Years of Education: N/A   Occupational History  . Not on file.   Social History Main Topics  . Smoking status: Former Smoker -- 1.00 packs/day for 55 years    Types: Cigarettes    Quit date: 05/05/2013  . Smokeless tobacco: Never Used     Comment: 12/07/2013 now using E- Cig.  . Alcohol Use: No     Comment: 12/07/2013 "I'll have a beer once in a blue moon"  . Drug Use: No  . Sexual Activity: Not Currently   Other Topics Concern  . Not on file   Social History Narrative  . No narrative on file     Review of Systems: General: negative for chills, fever, night sweats or weight changes.  Cardiovascular: negative for chest pain, dyspnea on exertion, edema, orthopnea, palpitations, paroxysmal nocturnal dyspnea or  shortness of breath Dermatological: negative for rash Respiratory: negative for cough or wheezing Urologic: negative for hematuria Abdominal: negative for nausea, vomiting, diarrhea, bright red blood per rectum, melena, or hematemesis Neurologic: negative for visual changes, syncope, or dizziness All other systems reviewed and are otherwise negative except as noted above.    Blood pressure 152/86, pulse 84, height 5' 9.5" (1.765 m), weight 212 lb (96.163 kg).  General appearance: alert and no distress Neck: no adenopathy, no carotid bruit, no JVD, supple, symmetrical, trachea midline and thyroid not enlarged, symmetric, no tenderness/mass/nodules Lungs: clear to auscultation bilaterally Heart: regular rate and rhythm, S1, S2 normal, no murmur, click, rub or gallop Extremities: extremities normal, atraumatic, no cyanosis or edema  EKG  not performed today  ASSESSMENT AND PLAN:   PVD - bilat SFA disease - s/p PCTA of right SFA 06/30/13; staged PCI to prox & mid third of Lt SFA Turbohawk arthrectomy 07/07/13 [name] underwent triple atherectomy for the second time of his left SFA 12/07/13 resulting in marked improvement in his symptoms and Dopplers. His most recent lower extremity arterial Doppler studies performed post procedure 12/11/13 revealed a right ABI 0.96 and the left of 0.83. He no longer has claudication.  Hyperlipidemia In addition to his pravastatin I added Zetia to his medical regimen and we'll recheck a lipid and liver profile in 2 months  Essential hypertension Well-controlled on current medications  CAD- s/p CABG X 4 05/12/13 His most recent 2-D echo revealed an ejection fraction of 40-45%. He is asymptomatic. I have released him to go back to work.      Runell GessJonathan J. Tenleigh Byer MD FACP,FACC,FAHA, [facility] 12/30/2013 4:57 PM

## 2013-12-30 NOTE — Assessment & Plan Note (Signed)
His most recent 2-D echo revealed an ejection fraction of 40-45%. He is asymptomatic. I have released him to go back to work.

## 2013-12-30 NOTE — Assessment & Plan Note (Signed)
[name] underwent triple atherectomy for the second time of his left SFA 12/07/13 resulting in marked improvement in his symptoms and Dopplers. His most recent lower extremity arterial Doppler studies performed post procedure 12/11/13 revealed a right ABI 0.96 and the left of 0.83. He no longer has claudication.

## 2013-12-30 NOTE — Assessment & Plan Note (Signed)
Well-controlled on current medications 

## 2014-01-18 NOTE — Telephone Encounter (Signed)
Encounter Closed---01/18/14 TP 

## 2014-02-02 DIAGNOSIS — H269 Unspecified cataract: Secondary | ICD-10-CM | POA: Insufficient documentation

## 2014-02-12 DIAGNOSIS — H25049 Posterior subcapsular polar age-related cataract, unspecified eye: Secondary | ICD-10-CM | POA: Insufficient documentation

## 2014-02-12 DIAGNOSIS — H3581 Retinal edema: Secondary | ICD-10-CM | POA: Insufficient documentation

## 2014-04-28 ENCOUNTER — Other Ambulatory Visit: Payer: Self-pay | Admitting: Cardiology

## 2014-04-29 NOTE — Telephone Encounter (Signed)
E-SENT RX 

## 2014-04-29 NOTE — Telephone Encounter (Signed)
E-sent RX 

## 2014-05-04 ENCOUNTER — Encounter (HOSPITAL_COMMUNITY): Payer: Managed Care, Other (non HMO)

## 2014-06-15 ENCOUNTER — Encounter: Payer: Self-pay | Admitting: Cardiovascular Disease

## 2014-06-15 ENCOUNTER — Ambulatory Visit (INDEPENDENT_AMBULATORY_CARE_PROVIDER_SITE_OTHER): Payer: Managed Care, Other (non HMO) | Admitting: Cardiovascular Disease

## 2014-06-15 VITALS — BP 110/68 | HR 69 | Ht 69.5 in | Wt 218.0 lb

## 2014-06-15 DIAGNOSIS — I2510 Atherosclerotic heart disease of native coronary artery without angina pectoris: Secondary | ICD-10-CM

## 2014-06-15 DIAGNOSIS — E785 Hyperlipidemia, unspecified: Secondary | ICD-10-CM

## 2014-06-15 DIAGNOSIS — I739 Peripheral vascular disease, unspecified: Secondary | ICD-10-CM

## 2014-06-15 DIAGNOSIS — Z79899 Other long term (current) drug therapy: Secondary | ICD-10-CM

## 2014-06-15 DIAGNOSIS — D689 Coagulation defect, unspecified: Secondary | ICD-10-CM

## 2014-06-15 NOTE — Patient Instructions (Signed)
Dr. Allyson SabalBerry has ordered a peripheral angiogram to be done at [facility].  This procedure is going to look at the bloodflow in your lower extremities.  If Dr. Allyson SabalBerry is able to open up the arteries, you will have to spend one night in the hospital.  If he is not able to open the arteries, you will be able to go home that same day.    After the procedure, you will not be allowed to drive for 3 days or push, pull, or lift anything greater than 10 lbs for one week.    You will be required to have the following tests prior to the procedure:  1. Blood work-the blood work can be done no more than 7 days prior to the procedure.  It can be done at any [facility] lab.  There is one downstairs on the first floor of this building and one in the [facility] ([address])  2. Chest Xray-the chest xray order has already been placed at the [facility].     *REPS Scott  lower extremity arterial doppler (done prior to the angiogram)- During this test, ultrasound is used to evaluate arterial blood flow in the legs. Allow approximately one hour for this exam.

## 2014-06-15 NOTE — Assessment & Plan Note (Signed)
Controlled on current medications 

## 2014-06-15 NOTE — Assessment & Plan Note (Signed)
History of CAD status post coronary artery bypass grafting x4 by Dr. Lavinia SharpsBartel 05/12/13 with a sequential LIMA to the LAD and diagonal branch, vein to obtuse marginal branch and to the PDA. His postop course was uncomplicated. Because of the symptoms of congestive heart failure he underwent 2-D echo which revealed a decrease in his EF to 20-25% 2 months postop. A Myoview stress test showed scar in the inferior wall. Ultimately we Revealing a high-grade stenosis in the mid SVG to the  RCA which was stented. He did wear a LifeVest  But ultimately his EF rose to 40%. He is asymptomatic. He does have new anterolateral T wave inversion however.

## 2014-06-15 NOTE — Assessment & Plan Note (Signed)
On statin therapy. We will recheck a lipid and liver profile 

## 2014-06-15 NOTE — Assessment & Plan Note (Signed)
The patient is status post multiple interventions on both SFAs. These were done primarily with directional arthrectomy. His most recent lower extremity arterial Doppler study performed 12/11/13 revealed a right ABI 0.96 and a left of 0.83. He does complain of recurrent right greater than left calf claudication. I'm going to recheck lower extremity arterial Doppler studies.

## 2014-06-15 NOTE — Progress Notes (Signed)
06/15/2014 Dennis [name]   Sep 13, 1946  [id number]  Primary Physician No primary provider on file. Primary Cardiologist: Dennis Gess MD Dennis [name]   HPI:  [name] is a 68 year old married Caucasian male father of one, grandmother, grandfather is accompanied by his wife today. He was referred by [facility] at [facility] for evaluation of claudication and arterial Doppler studies which were obtained in our office 04/07/13. His cardiovascular risk factors include type 2 diabetes, hypertension, and hyperlipidemia. He has a 50-100-pack-year history of tobacco abuse currently smoking one pack to 2 packs a day. There is no family history of heart disease. He's never had a heart attack or stroke. He does complain of dyspnea on exertion. He has had claudication for the last 2 years worse over the 3 months  Prior to initially seen me which was now lifestyle limiting. Doppler studies in our office performed 04/07/13 revealed a right ABI of 0.63 and a left ABI of 0.70. He had high-grade SFA disease bilaterally as well as tibial disease.because of a positive Myoview stress test the patient first had a diagnostic coronary arteriogram. Which showed 3 vessel disease with moderate LV dysfunction. He'll underwent coronary bypass grafting x4 by Dr. Rexanne [name] on 05/12/13 with a sequential LIMA to the LAD and diagonal branch, vein to obtuse marginal branch and to the PDA. His postop course was uncomplicated. Following his coronary artery bypass graft surgery he underwent staged bilateral superficial femoral artery directional atherectomy with marked improvement in his claudication and arterial Doppler studies. This week he developed shortness of breath and was seen in an emergency room and diagnosed with congestive heart failure. He was treated with Lasix and referred back to [place] for further evaluation. He saw Dennis Medici PA-C in our office yesterday who did a 2-D echocardiogram today that revealed  an EF of 20-25%. This is a finding now almost 3 months status post coronary revascularization.  He is currently wearing a LifeVest. A recent 2-D echo revealed an ejection fraction of 20-25% with global hypokinesia. A Myoview stress test showed inferior scar. PMr. Dennis [name] underwent cardiac catheterization by myself/5/14 with a high-grade stenosis within his PDA graft was stented. He continues to wear a life vest with a 2-D echo scheduled several weeks now to determine whether he's had improvement in LV function and, whether he can take off his life vest or needs to transition to an ICD.  He saw Dennis [name] PAC in the office last week . Bilateral lower extremity pain similar to this preintervention symptoms. Followup Dopplers confirmed recurrent disease.on 11/16/13 he underwent re\re angiography, TurboHAWK directional arthrectomy, PTA of distal right SFA with drug-eluting balloon. He had a excellent angiographic and clinical result. He had residual disease in the left left lower extremity lifestyle limiting claudication.on 12/07/13 he underwent directional atherectomy using block of his proximal left SFA. He does have residual 60% segmental disease in the mid and distal left SFA with one vessel runoff via the perineal. followup Doppler studies performed 12/11/13 revealed a right ABI 0.96 and a left ABI of 0.83. Since I saw him 6 months ago he's done relatively well has been stable from a heart point of view. He does get occasional reflux for which he takes TUMS". He does however complain of recurrent right greater than left lower extremity lifestyle limiting claudication.   Current Outpatient Prescriptions  Medication Sig Dispense Refill  . acetaminophen (TYLENOL) 325 MG tablet Take 2 tablets (650 mg total) by mouth every  4 (four) hours as needed for headache or mild pain.      Marland Kitchen. amLODipine (NORVASC) 2.5 MG tablet TAKE ONE TABLET BY MOUTH ONCE DAILY  30 tablet  6  . aspirin EC 81 MG tablet Take 1 tablet (81 mg  total) by mouth daily.  90 tablet  3  . carvedilol (COREG) 25 MG tablet TAKE 1 TABLET (25MG  TOTAL) BY MOUTH TWICE DAILY  180 tablet  3  . cetirizine (ZYRTEC) 10 MG tablet Take 10 mg by mouth daily as needed.       . clopidogrel (PLAVIX) 75 MG tablet Take 1 tablet (75 mg total) by mouth daily.  30 tablet  1  . exenatide (BYETTA 5 MCG PEN) 5 MCG/0.02ML SOPN injection Inject 10 mcg into the skin.      Marland Kitchen. ezetimibe (ZETIA) 10 MG tablet Take 1 tablet (10 mg total) by mouth daily.  90 tablet  3  . lisinopril (PRINIVIL,ZESTRIL) 40 MG tablet Take 40 mg by mouth daily.       Marland Kitchen. living well with diabetes book MISC 1 each by Does not apply route once.      . pantoprazole (PROTONIX) 40 MG tablet Take 1 tablet (40 mg total) by mouth daily.  30 tablet  11  . pravastatin (PRAVACHOL) 40 MG tablet Take 40 mg by mouth daily.       . sitaGLIPtin-metformin (JANUMET) 50-1000 MG per tablet Take 1 tablet by mouth 2 (two) times daily with a meal. HOLD for 2 days, restart on 12/10/2013.       No current facility-administered medications for this visit.    Allergies  Allergen Reactions  . Other Other (See Comments)    Seasonal  Nasal congestion   . Chlorhexidine Rash    History   Social History  . Marital Status: Married    Spouse Name: N/A    Number of Children: N/A  . Years of Education: N/A   Occupational History  . Not on file.   Social History Main Topics  . Smoking status: Former Smoker -- 1.00 packs/day for 55 years    Types: Cigarettes    Quit date: 05/05/2013  . Smokeless tobacco: Never Used     Comment: 12/07/2013 now using E- Cig.  . Alcohol Use: No     Comment: 12/07/2013 "I'll have a beer once in a blue moon"  . Drug Use: No  . Sexual Activity: Not Currently   Other Topics Concern  . Not on file   Social History Narrative  . No narrative on file     Review of Systems: General: negative for chills, fever, night sweats or weight changes.  Cardiovascular: negative for chest pain,  dyspnea on exertion, edema, orthopnea, palpitations, paroxysmal nocturnal dyspnea or shortness of breath Dermatological: negative for rash Respiratory: negative for cough or wheezing Urologic: negative for hematuria Abdominal: negative for nausea, vomiting, diarrhea, bright red blood per rectum, melena, or hematemesis Neurologic: negative for visual changes, syncope, or dizziness All other systems reviewed and are otherwise negative except as noted above.    Blood pressure 110/68, pulse 69, height 5' 9.5" (1.765 m), weight 218 lb (98.884 kg).  General appearance: alert and no distress Neck: no adenopathy, no carotid bruit, no JVD, supple, symmetrical, trachea midline and thyroid not enlarged, symmetric, no tenderness/mass/nodules Lungs: clear to auscultation bilaterally Heart: regular rate and rhythm, S1, S2 normal, no murmur, click, rub or gallop Extremities: extremities normal, atraumatic, no cyanosis or edema  EKG normal sinus rhythm  at 21 with lateral T-wave inversion which is new since his prior EKG  ASSESSMENT AND PLAN:   Essential hypertension Controlled on current medications  Hyperlipidemia On statin therapy. We will recheck a lipid and liver profile  CAD- s/p CABG X 4 05/12/13 History of CAD status post coronary artery bypass grafting x4 by Dr. Lavinia Sharps 05/12/13 with a sequential LIMA to the LAD and diagonal branch, vein to obtuse marginal branch and to the PDA. His postop course was uncomplicated. Because of the symptoms of congestive heart failure he underwent 2-D echo which revealed a decrease in his EF to 20-25% 2 months postop. A Myoview stress test showed scar in the inferior wall. Ultimately we Revealing a high-grade stenosis in the mid SVG to the  RCA which was stented. He did wear a LifeVest  But ultimately his EF rose to 40%. He is asymptomatic. He does have new anterolateral T wave inversion however.  PVD - bilat SFA disease - s/p PCTA of right SFA 06/30/13; staged PCI  to prox & mid third of Lt SFA Turbohawk arthrectomy 07/07/13 The patient is status post multiple interventions on both SFAs. These were done primarily with directional arthrectomy. His most recent lower extremity arterial Doppler study performed 12/11/13 revealed a right ABI 0.96 and a left of 0.83. He does complain of recurrent right greater than left calf claudication. I'm going to recheck lower extremity arterial Doppler studies.      Dennis Gess MD FACP,FACC,FAHA, [facility] 06/15/2014 4:12 PM

## 2014-06-16 ENCOUNTER — Encounter: Payer: Self-pay | Admitting: *Deleted

## 2014-06-16 ENCOUNTER — Ambulatory Visit (HOSPITAL_COMMUNITY)
Admission: RE | Admit: 2014-06-16 | Discharge: 2014-06-16 | Disposition: A | Discharge status: Home / Self Care | Payer: Managed Care, Other (non HMO) | Source: Ambulatory Visit | Attending: Cardiovascular Disease | Admitting: Cardiovascular Disease

## 2014-06-16 DIAGNOSIS — I739 Peripheral vascular disease, unspecified: Secondary | ICD-10-CM | POA: Diagnosis not present

## 2014-06-16 LAB — CBC
HCT: 31.7 % — ABNORMAL LOW (ref 39.0–52.0)
HEMOGLOBIN: 10.2 g/dL — AB (ref 13.0–17.0)
MCH: 23.7 pg — ABNORMAL LOW (ref 26.0–34.0)
MCHC: 32.2 g/dL (ref 30.0–36.0)
MCV: 73.5 fL — ABNORMAL LOW (ref 78.0–100.0)
PLATELETS: 255 10*3/uL (ref 150–400)
RBC: 4.31 MIL/uL (ref 4.22–5.81)
RDW: 16.1 % — AB (ref 11.5–15.5)
WBC: 5.9 10*3/uL (ref 4.0–10.5)

## 2014-06-16 LAB — COMPLETE METABOLIC PANEL WITH GFR
ALT: <8 U/L (ref 0–53)
AST: 12 U/L (ref 0–37)
Albumin: 3.8 g/dL (ref 3.5–5.2)
Alkaline Phosphatase: 63 U/L (ref 39–117)
BILIRUBIN TOTAL: 0.6 mg/dL (ref 0.2–1.2)
BUN: 13 mg/dL (ref 6–23)
CO2: 22 meq/L (ref 19–32)
Calcium: 9 mg/dL (ref 8.4–10.5)
Chloride: 99 mEq/L (ref 96–112)
Creat: 1.16 mg/dL (ref 0.50–1.35)
GFR, EST NON AFRICAN AMERICAN: 64 mL/min
GFR, Est African American: 74 mL/min
GLUCOSE: 282 mg/dL — AB (ref 70–99)
Potassium: 4.1 mEq/L (ref 3.5–5.3)
SODIUM: 134 meq/L — AB (ref 135–145)
Total Protein: 6.6 g/dL (ref 6.0–8.3)

## 2014-06-16 LAB — PROTIME-INR
INR: 1.13 (ref <1.50)
PROTHROMBIN TIME: 14.5 s (ref 11.6–15.2)

## 2014-06-16 LAB — LIPID PANEL
Cholesterol: 153 mg/dL (ref 0–200)
HDL: 44 mg/dL (ref >39)
LDL Cholesterol: 66 mg/dL (ref 0–99)
TRIGLYCERIDES: 217 mg/dL — AB (ref <150)
Total CHOL/HDL Ratio: 3.5 Ratio
VLDL: 43 mg/dL — ABNORMAL HIGH (ref 0–40)

## 2014-06-16 LAB — APTT: aPTT: 38 seconds — ABNORMAL HIGH (ref 24–37)

## 2014-06-16 NOTE — Progress Notes (Signed)
Lower Extremity Arterial Duplex Completed. °Brianna L Mazza,RVT °

## 2014-06-17 ENCOUNTER — Encounter (HOSPITAL_COMMUNITY): Payer: Self-pay | Admitting: Pharmacy Technician

## 2014-06-17 IMAGING — CR DG CHEST 2V
2 series · 2 of 2 positions shown · non-contrast
Comparison: None

CLINICAL DATA: Tobacco abuse

CHEST - 2 VIEW

[w chest pa]
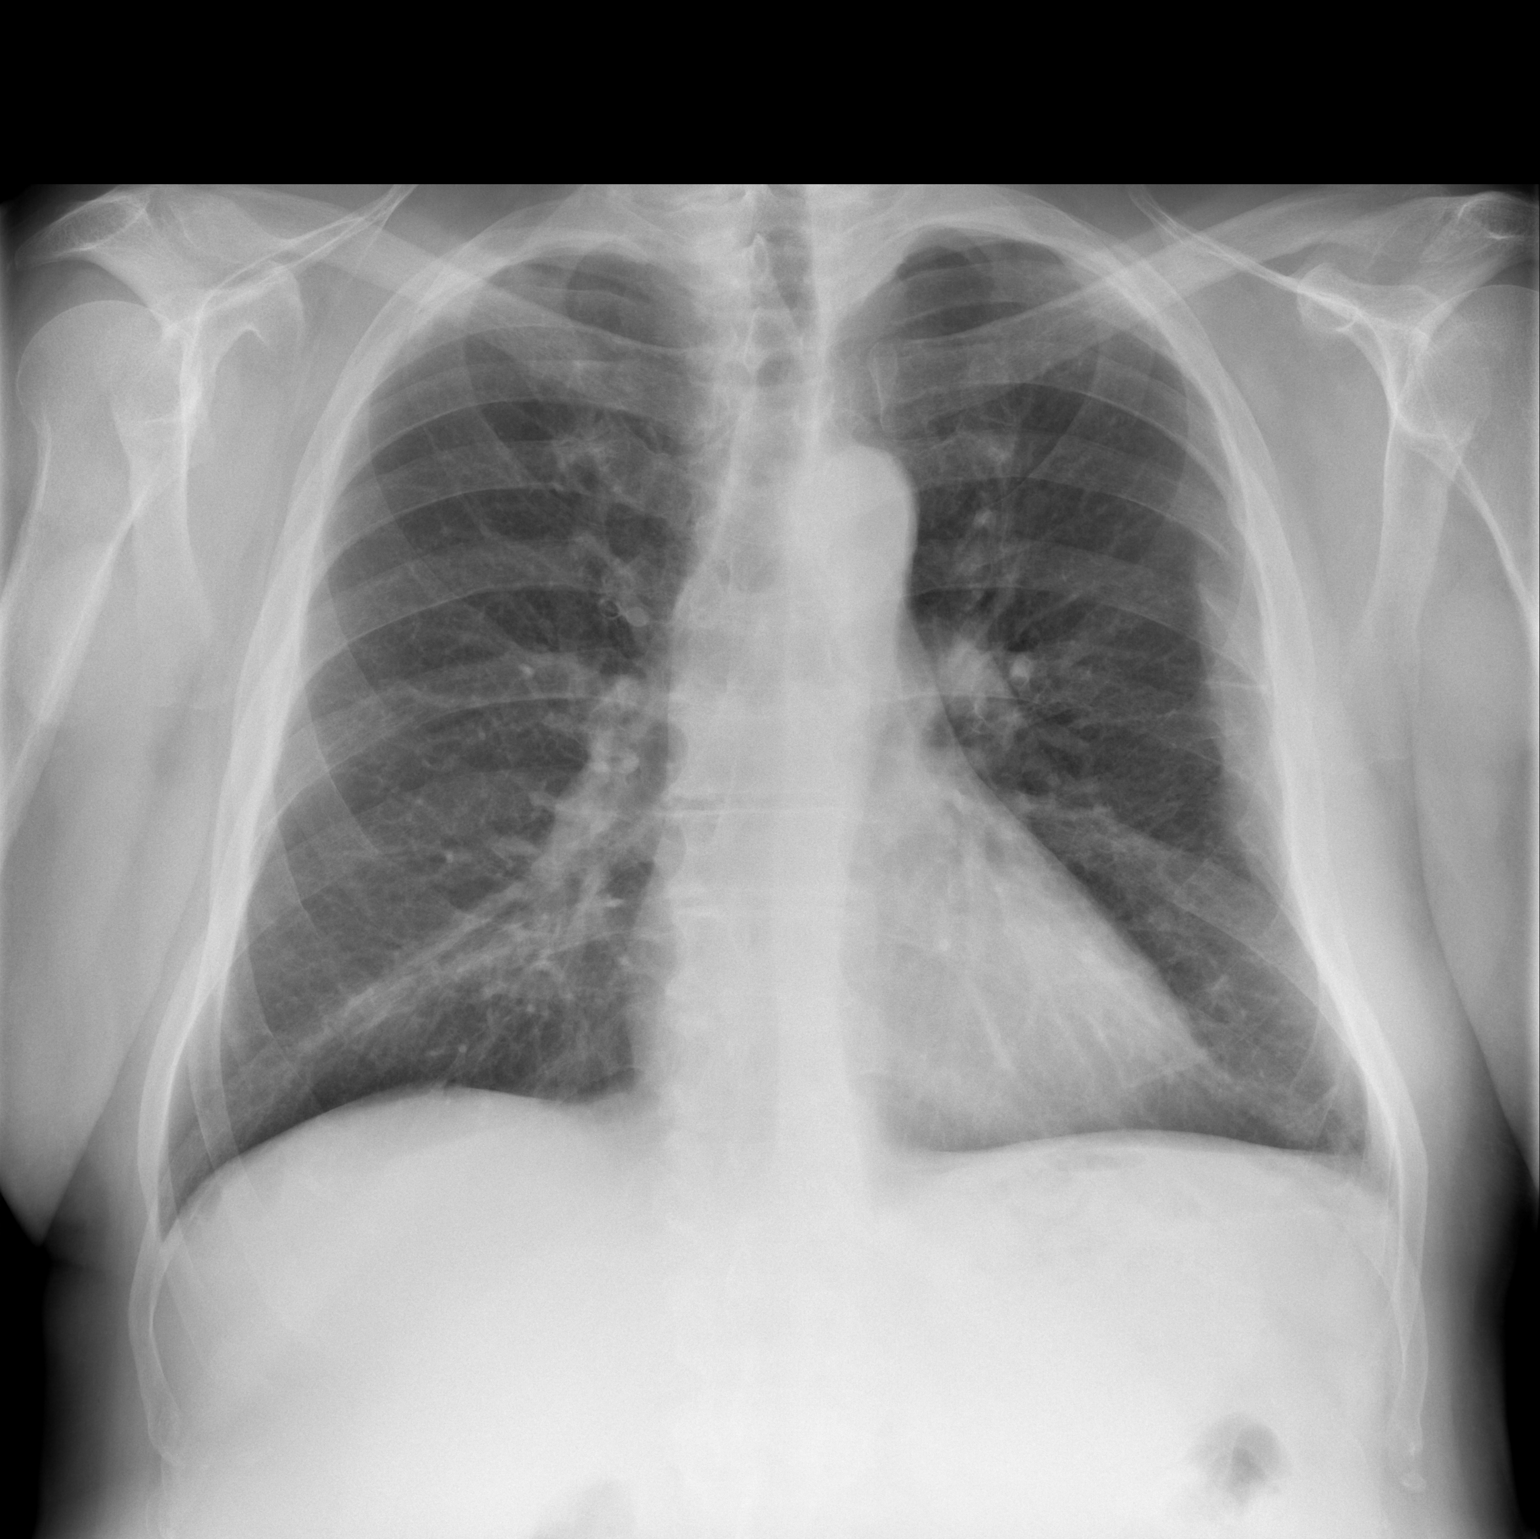

[w chest lat]
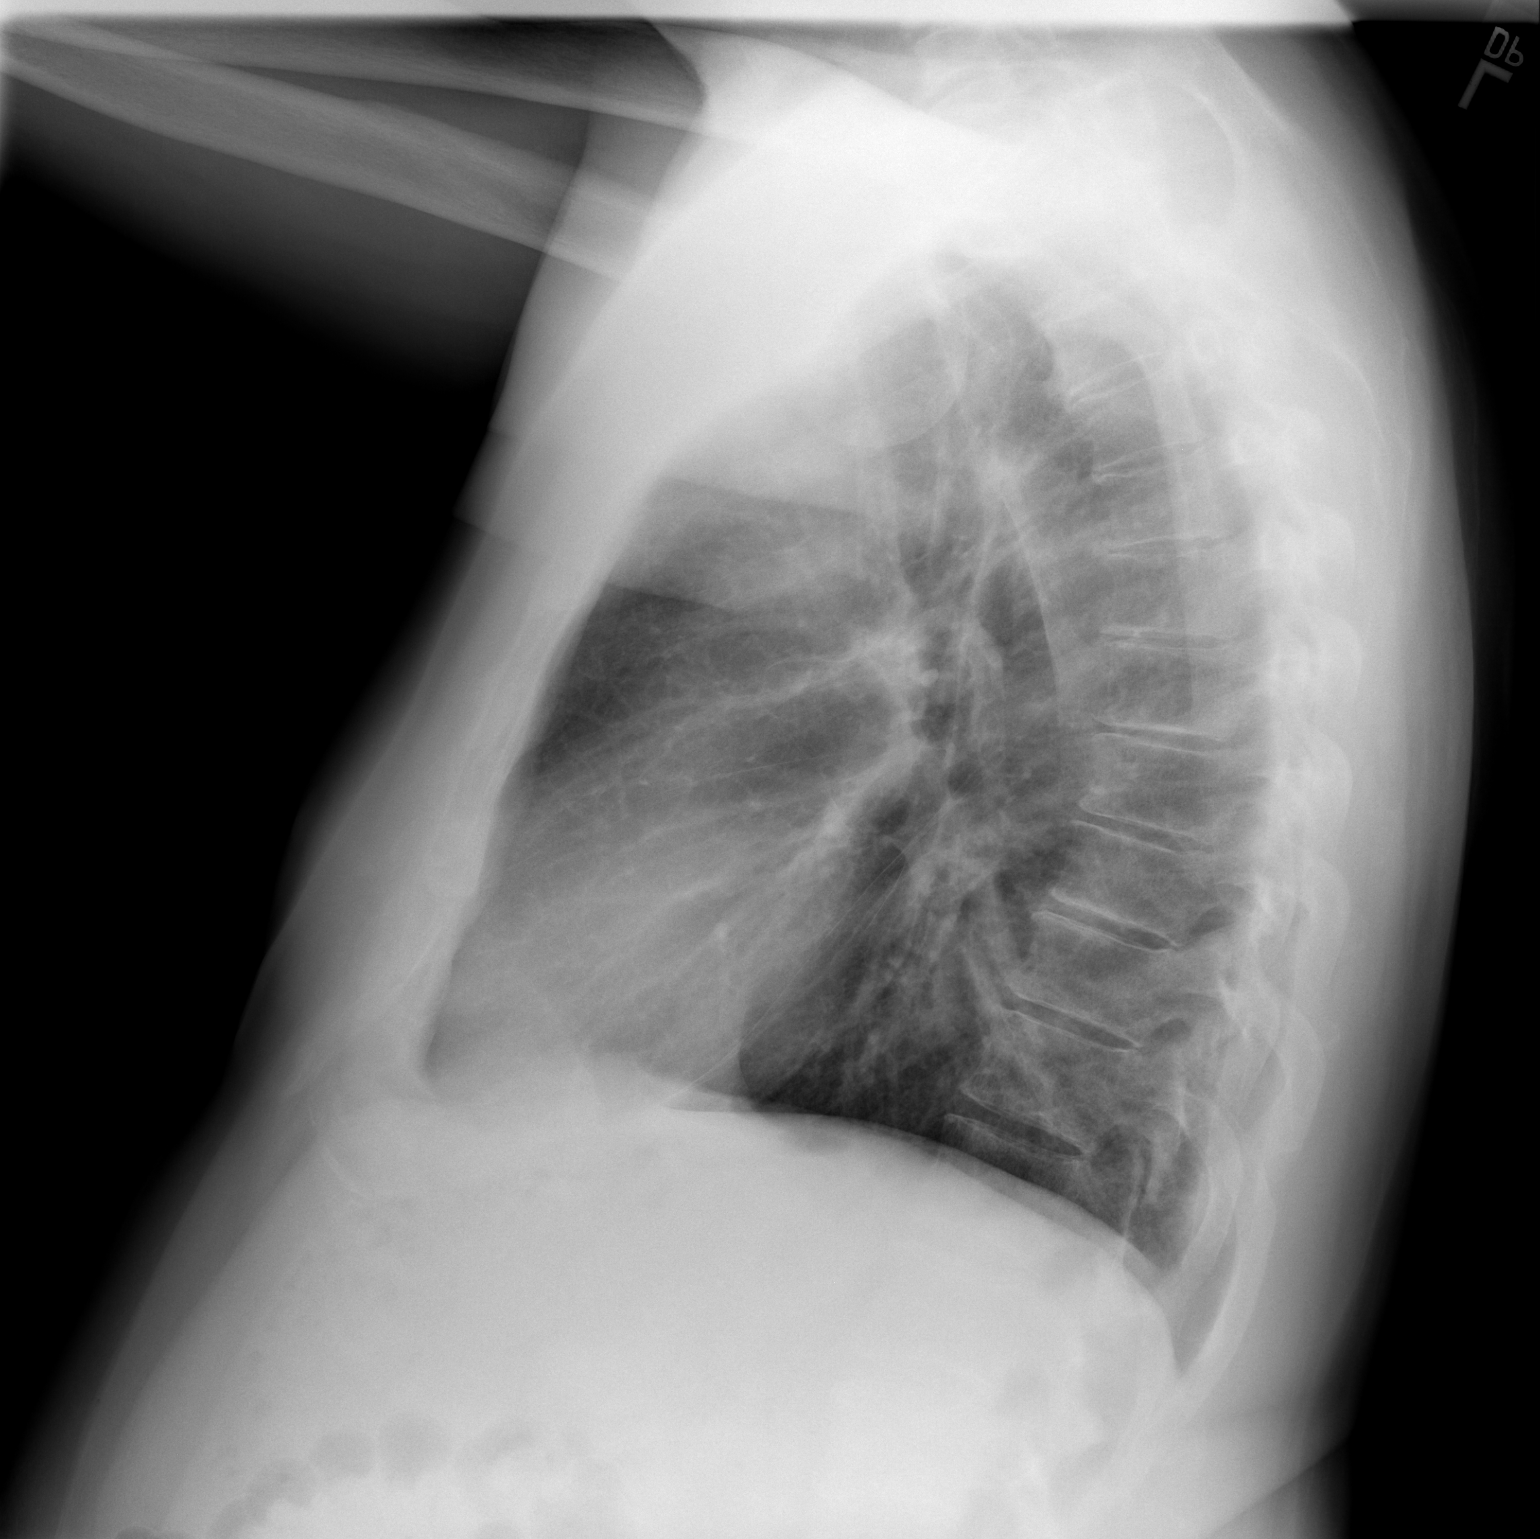

[2 of 2 positions shown; findings below may reference images not displayed]

FINDINGS: Cardiac and mediastinal contours are normal.  Lungs are
clear without infiltrate or effusion.  Multiple left rib fractures
are present of indeterminate age.
IMPRESSION: No acute cardiopulmonary abnormality.

## 2014-06-21 ENCOUNTER — Encounter: Payer: Self-pay | Admitting: Cardiovascular Disease

## 2014-06-21 ENCOUNTER — Encounter (HOSPITAL_COMMUNITY)
Admission: RE | Disposition: A | Discharge status: Home / Self Care | Payer: Self-pay | Source: Ambulatory Visit | Attending: Cardiovascular Disease

## 2014-06-21 ENCOUNTER — Ambulatory Visit (HOSPITAL_COMMUNITY)
Admission: RE | Admit: 2014-06-21 | Discharge: 2014-06-22 | Disposition: A | Discharge status: Home / Self Care | Payer: Managed Care, Other (non HMO) | Source: Ambulatory Visit | Attending: Cardiovascular Disease | Admitting: Cardiovascular Disease

## 2014-06-21 DIAGNOSIS — E785 Hyperlipidemia, unspecified: Secondary | ICD-10-CM | POA: Diagnosis not present

## 2014-06-21 DIAGNOSIS — I739 Peripheral vascular disease, unspecified: Secondary | ICD-10-CM | POA: Diagnosis present

## 2014-06-21 DIAGNOSIS — I70219 Atherosclerosis of native arteries of extremities with intermittent claudication, unspecified extremity: Secondary | ICD-10-CM

## 2014-06-21 DIAGNOSIS — I10 Essential (primary) hypertension: Secondary | ICD-10-CM | POA: Diagnosis present

## 2014-06-21 DIAGNOSIS — I70211 Atherosclerosis of native arteries of extremities with intermittent claudication, right leg: Secondary | ICD-10-CM | POA: Insufficient documentation

## 2014-06-21 DIAGNOSIS — E1159 Type 2 diabetes mellitus with other circulatory complications: Secondary | ICD-10-CM

## 2014-06-21 DIAGNOSIS — R931 Abnormal findings on diagnostic imaging of heart and coronary circulation: Secondary | ICD-10-CM

## 2014-06-21 DIAGNOSIS — E119 Type 2 diabetes mellitus without complications: Secondary | ICD-10-CM | POA: Diagnosis not present

## 2014-06-21 DIAGNOSIS — I25810 Atherosclerosis of coronary artery bypass graft(s) without angina pectoris: Secondary | ICD-10-CM | POA: Diagnosis not present

## 2014-06-21 DIAGNOSIS — Z720 Tobacco use: Secondary | ICD-10-CM | POA: Diagnosis not present

## 2014-06-21 DIAGNOSIS — I2510 Atherosclerotic heart disease of native coronary artery without angina pectoris: Secondary | ICD-10-CM

## 2014-06-21 DIAGNOSIS — Z79899 Other long term (current) drug therapy: Secondary | ICD-10-CM

## 2014-06-21 DIAGNOSIS — D689 Coagulation defect, unspecified: Secondary | ICD-10-CM

## 2014-06-21 DIAGNOSIS — I255 Ischemic cardiomyopathy: Secondary | ICD-10-CM | POA: Diagnosis present

## 2014-06-21 HISTORY — PX: LOWER EXTREMITY ANGIOGRAM: SHX5508

## 2014-06-21 HISTORY — PX: ANGIOPLASTY: SHX39

## 2014-06-21 LAB — GLUCOSE, CAPILLARY
GLUCOSE-CAPILLARY: 331 mg/dL — AB (ref 70–99)
GLUCOSE-CAPILLARY: 184 mg/dL — AB (ref 70–99)
Glucose-Capillary: 346 mg/dL — ABNORMAL HIGH (ref 70–99)
Glucose-Capillary: 271 mg/dL — ABNORMAL HIGH (ref 70–99)
Glucose-Capillary: 253 mg/dL — ABNORMAL HIGH (ref 70–99)
Glucose-Capillary: 374 mg/dL — ABNORMAL HIGH (ref 70–99)

## 2014-06-21 LAB — POCT ACTIVATED CLOTTING TIME
ACTIVATED CLOTTING TIME: 191 s
ACTIVATED CLOTTING TIME: 174 s
Activated Clotting Time: 186 seconds

## 2014-06-21 LAB — HEMOGLOBIN A1C
HEMOGLOBIN A1C: 10.4 % — AB (ref <5.7)
MEAN PLASMA GLUCOSE: 252 mg/dL — AB (ref <117)

## 2014-06-21 SURGERY — ANGIOGRAM, LOWER EXTREMITY
Anesthesia: LOCAL

## 2014-06-21 MED ORDER — ASPIRIN EC 81 MG PO TBEC
81.0000 mg | DELAYED_RELEASE_TABLET | Freq: Every day | ORAL | Status: DC
  Filled 2014-06-21: qty 1

## 2014-06-21 MED ORDER — HEPARIN (PORCINE) IN NACL 2-0.9 UNIT/ML-% IJ SOLN
INTRAMUSCULAR | Status: AC
  Filled 2014-06-21: qty 1000

## 2014-06-21 MED ORDER — MIDAZOLAM HCL 2 MG/2ML IJ SOLN
INTRAMUSCULAR | Status: AC
  Filled 2014-06-21: qty 2

## 2014-06-21 MED ORDER — HEPARIN SODIUM (PORCINE) 1000 UNIT/ML IJ SOLN
INTRAMUSCULAR | Status: AC
  Filled 2014-06-21: qty 1

## 2014-06-21 MED ORDER — LISINOPRIL 40 MG PO TABS
40.0000 mg | ORAL_TABLET | Freq: Every day | ORAL | Status: DC
  Filled 2014-06-21: qty 1

## 2014-06-21 MED ORDER — ASPIRIN 81 MG PO CHEW
81.0000 mg | CHEWABLE_TABLET | ORAL | Status: AC
  Administered 2014-06-21: 81 mg via ORAL

## 2014-06-21 MED ORDER — INSULIN ASPART 100 UNIT/ML ~~LOC~~ SOLN: 0.0000 [IU] | Freq: Three times a day (TID) | SUBCUTANEOUS | Status: DC

## 2014-06-21 MED ORDER — EZETIMIBE 10 MG PO TABS
10.0000 mg | ORAL_TABLET | Freq: Every day | ORAL | Status: DC
  Filled 2014-06-21: qty 1

## 2014-06-21 MED ORDER — AMLODIPINE BESYLATE 2.5 MG PO TABS
2.5000 mg | ORAL_TABLET | Freq: Every day | ORAL | Status: DC
  Filled 2014-06-21: qty 1

## 2014-06-21 MED ORDER — FENTANYL CITRATE 0.05 MG/ML IJ SOLN
INTRAMUSCULAR | Status: AC
  Filled 2014-06-21: qty 2

## 2014-06-21 MED ORDER — PRAVASTATIN SODIUM 40 MG PO TABS
40.0000 mg | ORAL_TABLET | Freq: Every day | ORAL | Status: DC
  Filled 2014-06-21: qty 1

## 2014-06-21 MED ORDER — ACETAMINOPHEN 325 MG PO TABS: 650.0000 mg | ORAL_TABLET | ORAL | Status: DC | PRN

## 2014-06-21 MED ORDER — CLOPIDOGREL BISULFATE 75 MG PO TABS
75.0000 mg | ORAL_TABLET | Freq: Every day | ORAL | Status: DC
  Administered 2014-06-22: 75 mg via ORAL

## 2014-06-21 MED ORDER — CARVEDILOL 25 MG PO TABS
25.0000 mg | ORAL_TABLET | Freq: Two times a day (BID) | ORAL | Status: DC
  Administered 2014-06-21 – 2014-06-22 (×2): 25 mg via ORAL
  Filled 2014-06-21 (×4): qty 1
  Filled 2014-06-21: qty 2

## 2014-06-21 MED ORDER — LIDOCAINE HCL (PF) 1 % IJ SOLN
INTRAMUSCULAR | Status: AC
  Filled 2014-06-21: qty 30

## 2014-06-21 MED ORDER — ASPIRIN 81 MG PO TABS: 81.0000 mg | ORAL_TABLET | Freq: Every day | ORAL | Status: DC

## 2014-06-21 MED ORDER — INSULIN ASPART 100 UNIT/ML ~~LOC~~ SOLN
0.0000 [IU] | Freq: Three times a day (TID) | SUBCUTANEOUS | Status: DC
  Administered 2014-06-21: 18:00:00 3 [IU] via SUBCUTANEOUS
  Administered 2014-06-21: 11 [IU] via SUBCUTANEOUS
  Filled 2014-06-21: qty 0.15

## 2014-06-21 MED ORDER — ASPIRIN 81 MG PO CHEW
CHEWABLE_TABLET | ORAL | Status: AC
  Administered 2014-06-21: 81 mg via ORAL
  Filled 2014-06-21: qty 1

## 2014-06-21 MED ORDER — PANTOPRAZOLE SODIUM 40 MG PO TBEC
40.0000 mg | DELAYED_RELEASE_TABLET | Freq: Every day | ORAL | Status: DC
  Filled 2014-06-21: qty 1

## 2014-06-21 MED ORDER — EXENATIDE 5 MCG/0.02ML ~~LOC~~ SOPN: 10.0000 ug | PEN_INJECTOR | Freq: Two times a day (BID) | SUBCUTANEOUS | Status: DC

## 2014-06-21 MED ORDER — SODIUM CHLORIDE 0.9 % IV SOLN
INTRAVENOUS | Status: AC
  Administered 2014-06-21: 16:00:00 via INTRAVENOUS

## 2014-06-21 MED ORDER — CLOPIDOGREL BISULFATE 75 MG PO TABS: 75.0000 mg | ORAL_TABLET | Freq: Every day | ORAL | Status: DC

## 2014-06-21 MED ORDER — SODIUM CHLORIDE 0.9 % IV SOLN: INTRAVENOUS | Status: DC

## 2014-06-21 MED ORDER — ONDANSETRON HCL 4 MG/2ML IJ SOLN: 4.0000 mg | Freq: Four times a day (QID) | INTRAMUSCULAR | Status: DC | PRN

## 2014-06-21 MED ORDER — ASPIRIN EC 325 MG PO TBEC
325.0000 mg | DELAYED_RELEASE_TABLET | Freq: Every day | ORAL | Status: DC
Start: 2014-06-21 — End: 2014-06-22

## 2014-06-21 MED ORDER — SODIUM CHLORIDE 0.9 % IJ SOLN: 3.0000 mL | INTRAMUSCULAR | Status: DC | PRN

## 2014-06-21 MED ORDER — HYDRALAZINE HCL 20 MG/ML IJ SOLN: 10.0000 mg | INTRAMUSCULAR | Status: DC | PRN

## 2014-06-21 MED ORDER — INSULIN ASPART 100 UNIT/ML ~~LOC~~ SOLN
SUBCUTANEOUS | Status: AC
  Filled 2014-06-21: qty 1

## 2014-06-21 NOTE — Telephone Encounter (Signed)
PA form for [facility]oprazole [facility]. [id number]

## 2014-06-21 NOTE — Care Management Note (Addendum)
  Page 2 of 2   06/22/2014     1:52:07 PM CARE MANAGEMENT NOTE 06/22/2014  Patient:  Dennis [name],Dennis [name]   Account Number:  [id number]  Date Initiated:  06/21/2014  Documentation initiated by:  Shimshon Narula  Subjective/Objective Assessment:   Hx/o currently wearing a LifeVest. EF:  20-25%     Action/Plan:   CM to follow for disposition needs   Anticipated DC Date:  06/22/2014   Anticipated DC Plan:  HOME/SELF CARE         Choice offered to / List presented to:             Status of service:  Completed, signed off Medicare Important Message given?   (If response is "NO", the following Medicare IM given date fields will be blank) Date Medicare IM given:   Medicare IM given by:   Date Additional Medicare IM given:   Additional Medicare IM given by:    Discharge Disposition:  HOME/SELF CARE  Per UR Regulation:    If discussed at Long Length of Stay Meetings, dates discussed:    Comments:  Fraida Veldman RN, BSN, MSHL, CCM  Nurse - Case Manager,  (Unit [phone])  [phone]  06/22/2014 Social:  home with wife.  Denies any falls or safety concerns.  Confirms compliance with medications and appt's. Meds:  Plavix - active prior to this admission PCP:  [facility] Farley LyAlyssa Papuga, [facility] [address] [place], [place] [address] [phone] Last appt 11/2013 and next appt 06/22/14 2:15pm Dispo Plan:  Home / Self care - patient was not required to d/c with life vest per assigned nurse/Laura Cox    Merry Pond RN, BSN, MSHL, CCM  Nurse - Case Manager,  (Unit [phone])  [phone]  06/21/2014 Hx/o currently wearing a LifeVest. EF:  20-25% Procedure: angiography on 06/21/2014 Med Review: __________pending Dispo Plan:  Home / Self care

## 2014-06-21 NOTE — Progress Notes (Signed)
Site area: lt groin Site Prior to Removal:  Level 0 Pressure Applied For: 25 minutes Manual:   yes Patient Status During Pull:  stable Post Pull Site:  Level 0 Post Pull Instructions Given:  yes Post Pull Pulses Present: yes Dressing Applied: tegaderm  Bedrest begins @ 1615 Comments: no complications

## 2014-06-21 NOTE — CV Procedure (Signed)
Dennis [name] is a 68 y.o. male    [id number] LOCATION:  FACILITY: [facility]  PHYSICIAN: Dennis [name], M.D. 1946/03/02   DATE OF PROCEDURE:  06/21/2014  DATE OF DISCHARGE:     PV Angiogram/Intervention    History obtained from chart review.Dennis [name] is a 68 year old married Caucasian male father of one, grandmother, grandfather is accompanied by his wife today. He was referred by [facility] Dennis [name] at [facility] for evaluation of claudication and arterial Doppler studies which were obtained in our office 04/07/13. His cardiovascular risk factors include type 2 diabetes, hypertension, and hyperlipidemia. He has a 50-100-pack-year history of tobacco abuse currently smoking one pack to 2 packs a day. There is no family history of heart disease. He's never had a heart attack or stroke. He does complain of dyspnea on exertion. He has had claudication for the last 2 years worse over the 3 months Prior to initially seen me which was now lifestyle limiting. Doppler studies in our office performed 04/07/13 revealed a right ABI of 0.63 and a left ABI of 0.70. He had high-grade SFA disease bilaterally as well as tibial disease.because of a positive Myoview stress test the patient first had a diagnostic coronary arteriogram. Which showed 3 vessel disease with moderate LV dysfunction. He'll underwent coronary bypass grafting x4 by Dr. Rexanne [name] on 05/12/13 with a sequential LIMA to the LAD and diagonal branch, vein to obtuse marginal branch and to the PDA. His postop course was uncomplicated. Following his coronary artery bypass graft surgery he underwent staged bilateral superficial femoral artery directional atherectomy with marked improvement in his claudication and arterial Doppler studies. This week he developed shortness of breath and was seen in an emergency room and diagnosed with congestive heart failure. He was treated with Lasix and referred back to [place] for further evaluation. He saw Dennis Medici PA-C in  our office yesterday who did a 2-D echocardiogram today that revealed an EF of 20-25%. This is a finding now almost 3 months status post coronary revascularization.  He is currently wearing a LifeVest. A recent 2-D echo revealed an ejection fraction of 20-25% with global hypokinesia. A Myoview stress test showed inferior scar. [name] underwent cardiac catheterization by myself/5/14 with a high-grade stenosis within his PDA graft was stented. He continues to wear a life vest with a 2-D echo scheduled several weeks now to determine whether he's had improvement in LV function and, whether he can take off his life vest or needs to transition to an ICD.  He saw Dennis [name] in the office last week . Bilateral lower extremity pain similar to this preintervention symptoms. Followup Dopplers confirmed recurrent disease.on 11/16/13 he underwent re\re angiography, TurboHAWK directional arthrectomy, PTA of distal right SFA with drug-eluting balloon. He had a excellent angiographic and clinical result. He had residual disease in the left left lower extremity lifestyle limiting claudication.on 12/07/13 he underwent directional atherectomy using block of his proximal left SFA. He does have residual 60% segmental disease in the mid and distal left SFA with one vessel runoff via the perineal. followup Doppler studies performed 12/11/13 revealed a right ABI 0.96 and a left ABI of 0.83. Since I saw him 6 months ago he's done relatively well has been stable from a heart point of view. He does get occasional reflux for which he takes TUMS". He does however complain of recurrent right greater than left lower extremity lifestyle limiting claudication. His followup Dopplers revealed a high-frequency signal in his proximal right SFA with  slight decrease in his ABI. He presented for angiography and potential endovascular therapy of his right SFA    PROCEDURE DESCRIPTION:   The patient was brought to the second floor [facility] cath lab in the postabsorptive state. He was premedicated with Valium 5 mg by mouth, IV Versed and fentanyl. His left groinwas prepped and shaved in usual sterile fashion. Xylocaine 1% was used for local anesthesia. A 5 French sheath was inserted into the left common femoral artery using standard Seldinger technique. A 5 French pigtail catheter was placed in the distal abdominal aorta. Distal abdominal aortography, bilateral iliac angiography with bifemoral runoff was performed using bolus chase digital subtraction step table technique. Omnipaque dye was used for the entirety of the case. Retrograde aortic pressure was monitored during the case.   HEMODYNAMICS:    AO SYSTOLIC/AO DIASTOLIC: 160/77   Angiographic Data:   1: Bilateral iliac angiography-iliac arteries are widely patent  2: Left lower extremity-the previously atherectomized sites in the proximal and mid left SFA were widely patent. There was a 50% segmental stenosis in the adductor canal with one vessel runoff via the perineal artery  3: Right lower extremity-there is a 40% ostial right SFA, 60% proximal followed by a 90% proximal probably representing the "culprit lesion". There was no a 60% mid to apical lesion in the mid right SFA with essentially one-vessel runoff though his posterior tibial was patent and diffusely diseased.  IMPRESSION:Dennis [name]'s left leg appears to maintain its patency. I have high-grade disease of proximal right SFA corresponding to his duplex area of high-frequency signal suggesting a physiologically significant lesion. Will proceed with HAWK 1 directional atherectomy followed by drug-eluting balloon angioplasty.   Procedure Description:contralateral access was obtained with a 5 [place] crossover catheter, Rosen wire , 7 French 45 cm multipurpose destination sheath.the patient received a total of 9000 units of heparin with ACT near 200. A total of 157 cc of contrast was administered to the patient. I  crossed the lesion with an 014/300 cm long [place] core wire. I then placed a 6 mm spider distal protection device in the mid right SFA. Following this I performed directional arthrectomy using the [facility]  arthrectomy device in multiple orientations. I will remove a considerable amount of atherosclerotic plaque. Following this I performed drug-eluting balloon angioplasty with a 6 mm x 18 mm long Lutonix drug-eluting balloon at 4 atmospheres for 20 minutes. The final angiographic result with reduction of a 90% focal lesion to 0% residual with excellent flow. The patient tolerated the procedure well. I captured the spider distal protection device I went through the sheath across the bifurcation exchanging over an 035 wire for a short 7 [place] sheath. The patient left the lab in stable condition.  Final Impression: successful Hawk 1 directional atherectomy , PTA using drug-eluting balloon of high-grade proximal right SFA stenosis for lifestyle limiting claudication. The patient will be treated with dual and phototherapy. The sheath will be removed once the ACT falls below 170 pressure will be held. He'll be hydrated overnight, discharged in the morning. We will get followup lower extremity arterial Doppler studies in our [place] office in one week and he will see mid-level provider back in 2-3 weeks.    Runell GessBERRY,JONATHAN J. MD, [facility] 06/21/2014 3:27 PM

## 2014-06-21 NOTE — Progress Notes (Addendum)
Theodore Demarkhonda Barrett PA was paged to address glucose of 331.  Rhonda PA returned call and orders followed for pre cath lab procedure. I reconfirmed with Victorino DikeJennifer RN to go ahead and give insulin according to sliding scale.

## 2014-06-22 ENCOUNTER — Other Ambulatory Visit: Payer: Self-pay | Admitting: Nurse Practitioner

## 2014-06-22 ENCOUNTER — Encounter (HOSPITAL_COMMUNITY): Payer: Self-pay | Admitting: Nurse Practitioner

## 2014-06-22 DIAGNOSIS — I70211 Atherosclerosis of native arteries of extremities with intermittent claudication, right leg: Secondary | ICD-10-CM | POA: Diagnosis not present

## 2014-06-22 DIAGNOSIS — I739 Peripheral vascular disease, unspecified: Secondary | ICD-10-CM

## 2014-06-22 DIAGNOSIS — I2510 Atherosclerotic heart disease of native coronary artery without angina pectoris: Secondary | ICD-10-CM

## 2014-06-22 DIAGNOSIS — R931 Abnormal findings on diagnostic imaging of heart and coronary circulation: Secondary | ICD-10-CM

## 2014-06-22 DIAGNOSIS — I25810 Atherosclerosis of coronary artery bypass graft(s) without angina pectoris: Secondary | ICD-10-CM

## 2014-06-22 DIAGNOSIS — Z720 Tobacco use: Secondary | ICD-10-CM

## 2014-06-22 LAB — CBC
HCT: 28.6 % — ABNORMAL LOW (ref 39.0–52.0)
Hemoglobin: 9.1 g/dL — ABNORMAL LOW (ref 13.0–17.0)
MCH: 23.8 pg — ABNORMAL LOW (ref 26.0–34.0)
MCHC: 31.8 g/dL (ref 30.0–36.0)
MCV: 74.9 fL — ABNORMAL LOW (ref 78.0–100.0)
Platelets: 241 10*3/uL (ref 150–400)
RBC: 3.82 MIL/uL — ABNORMAL LOW (ref 4.22–5.81)
RDW: 14.9 % (ref 11.5–15.5)
WBC: 6.1 10*3/uL (ref 4.0–10.5)

## 2014-06-22 LAB — BASIC METABOLIC PANEL
ANION GAP: 14 (ref 5–15)
BUN: 10 mg/dL (ref 6–23)
CO2: 23 mEq/L (ref 19–32)
CREATININE: 1.02 mg/dL (ref 0.50–1.35)
Calcium: 8.5 mg/dL (ref 8.4–10.5)
Chloride: 100 mEq/L (ref 96–112)
GFR calc non Af Amer: 73 mL/min — ABNORMAL LOW (ref >90)
GFR, EST AFRICAN AMERICAN: 85 mL/min — AB (ref >90)
Glucose, Bld: 291 mg/dL — ABNORMAL HIGH (ref 70–99)
Potassium: 3.8 mEq/L (ref 3.7–5.3)
Sodium: 137 mEq/L (ref 137–147)

## 2014-06-22 LAB — GLUCOSE, CAPILLARY
Glucose-Capillary: 266 mg/dL — ABNORMAL HIGH (ref 70–99)
Glucose-Capillary: 289 mg/dL — ABNORMAL HIGH (ref 70–99)

## 2014-06-22 MED ORDER — INSULIN ASPART 100 UNIT/ML ~~LOC~~ SOLN
0.0000 [IU] | Freq: Every day | SUBCUTANEOUS | Status: DC
  Administered 2014-06-22: 5 [IU] via SUBCUTANEOUS

## 2014-06-22 MED ORDER — CLOPIDOGREL BISULFATE 75 MG PO TABS: 75.0000 mg | ORAL_TABLET | Freq: Every day | ORAL | Status: DC

## 2014-06-22 MED ORDER — INSULIN ASPART 100 UNIT/ML ~~LOC~~ SOLN
0.0000 [IU] | Freq: Three times a day (TID) | SUBCUTANEOUS | Status: DC
  Administered 2014-06-22: 8 [IU] via SUBCUTANEOUS

## 2014-06-22 MED ORDER — SITAGLIPTIN PHOS-METFORMIN HCL 50-1000 MG PO TABS: 1.0000 | ORAL_TABLET | Freq: Two times a day (BID) | ORAL | Status: DC

## 2014-06-22 NOTE — Discharge Instructions (Signed)
**  PLEASE REMEMBER TO BRING ALL OF YOUR MEDICATIONS TO EACH OF YOUR FOLLOW-UP OFFICE VISITS. ° °NO HEAVY LIFTING OR SEXUAL ACTIVITY X 7 DAYS. °NO DRIVING X 3-5 DAYS. °NO SOAKING BATHS, HOT TUBS, POOLS, ETC., X 7 DAYS. ° °Groin Site Care °Refer to this sheet in the next few weeks. These instructions provide you with information on caring for yourself after your procedure. Your caregiver may also give you more specific instructions. Your treatment has been planned according to current medical practices, but problems sometimes occur. Call your caregiver if you have any problems or questions after your procedure. °HOME CARE INSTRUCTIONS °· You may shower 24 hours after the procedure. Remove the bandage (dressing) and gently wash the site with plain soap and water. Gently pat the site dry.  °· Do not apply powder or lotion to the site.  °· Do not sit in a bathtub, swimming pool, or whirlpool for 5 to 7 days.  °· No bending, squatting, or lifting anything over 10 pounds (4.5 kg) as directed by your caregiver.  °· Inspect the site at least twice daily.  ° °What to expect: °· Any bruising will usually fade within 1 to 2 weeks.  °· Blood that collects in the tissue (hematoma) may be painful to the touch. It should usually decrease in size and tenderness within 1 to 2 weeks.  °SEEK IMMEDIATE MEDICAL CARE IF: °· You have unusual pain at the groin site or down the affected leg.  °· You have redness, warmth, swelling, or pain at the groin site.  °· You have drainage (other than a small amount of blood on the dressing).  °· You have chills.  °· You have a fever or persistent symptoms for more than 72 hours.  °· You have a fever and your symptoms suddenly get worse.  °· Your leg becomes pale, cool, tingly, or numb.  °You have heavy bleeding from the site. Hold pressure on the site. . ° °

## 2014-06-22 NOTE — Discharge Summary (Signed)
Discharge Summary   Patient ID: Dennis [name],  MRN: [id number], DOB/AGE: Mar 22, 1946 68 y.o.  Admit date: 06/21/2014 Discharge date: 06/22/2014  Primary Care Provider: Pcp Not In System Primary Cardiologist: Erlene Quan, MD   Discharge Diagnoses Principal Problem:   Claudication, class III  **Status post lower extremity angiography and successful percutaneous transluminal intervention and drug-eluting balloon angioplasty upon the right superficial femoral artery.  Active Problems:   PAD (peripheral artery disease)   Type 2 diabetes mellitus not at goal   CAD of artery bypass graft- SVG-PDA DES 08/25/13   Ischemic cardiomyopathy   Essential hypertension   Hyperlipidemia  Allergies Allergies  Allergen Reactions  . Other Other (See Comments)    Seasonal  Nasal congestion   . Chlorhexidine Rash   Procedures  Peripheral Angiography with Percutaneous Transluminal Angioplasty 10.5.2015  HEMODYNAMICS:     AO SYSTOLIC/AO DIASTOLIC: 160/77    Angiographic Data:   1: Bilateral iliac angiography-iliac arteries are widely patent  2: Left lower extremity-the previously atherectomized sites in the proximal and mid left SFA were widely patent. There was a 50% segmental stenosis in the adductor canal with one vessel runoff via the perineal artery  3: Right lower extremity-there is a 40% ostial right SFA, 60% proximal followed by a 90% proximal probably representing the "culprit lesion". There was no a 60% mid to apical lesion in the mid right SFA with essentially one-vessel runoff though his posterior tibial was patent and diffusely diseased.   **The R SFA was successfully treated with atherectomy and a 6 mm x 18 mm long Lutonix drug-eluting balloon. _____________   History of Present Illness  68 year old male with prior history of coronary artery disease status post prior coronary artery bypass grafting and peripheral arterial disease status post bilateral superficial femoral arterial  procedures. He was recently seen in clinic and reported right greater than left low extremity lifestyle limiting claudication. Followup Dopplers revealed a high-frequency signal in his right proximal superficial femoral artery with slight decrease in his ABI. Decision was made to pursue repeat peripheral angiography.  Hospital Course  Patient presented to the [facility] laboratory on 06/21/2014 and underwent diagnostic peripheral angiography revealing widely patent bilateral iliac arteries, patent proximal and mid left superficial femoral artery, and a 90% proximal right superficial femoral artery stenosis. This was felt to be the culprit for his right-sided symptoms and was subsequently treated with atherectomy and drug-eluting balloon angioplasty. Patient tolerated procedure well and post procedure he has been doing without recurrent symptoms or limitations. He will be discharged home today in good condition with plans for followup lower extremity Dopplers on October 13, and office followup in 2-3 weeks.  Discharge Vitals Blood pressure 172/89, pulse 85, temperature 98.1 F (36.7 C), temperature source Oral, resp. rate 18, height 5' 9.5" (1.765 m), weight 218 lb 11.1 oz (99.2 kg), SpO2 95.00%.  Filed Weights   06/21/14 1139 06/22/14 0019  Weight: 218 lb (98.884 kg) 218 lb 11.1 oz (99.2 kg)    Labs  CBC  Recent Labs  06/22/14 0341  WBC 6.1  HGB 9.1*  HCT 28.6*  MCV 74.9*  PLT 241   Basic Metabolic Panel  Recent Labs  06/22/14 0341  NA 137  K 3.8  CL 100  CO2 23  GLUCOSE 291*  BUN 10  CREATININE 1.02  CALCIUM 8.5   Hemoglobin A1C  Recent Labs  06/21/14 1820  HGBA1C 10.4*   Disposition  Pt is being discharged home today in good  condition.  Follow-up Plans & Appointments      Follow-up Information   Follow up with [facility] On 06/29/2014. (1:00 PM - for lower extremity dopplers.)       Follow up with Abelino DerrickKILROY,LUKE K,  PA-C On 07/07/2014. (3:30 PM)    Specialty:  Cardiology   Contact information:   [address] [place] [address] [phone]       Follow up with Primary Care MD  On 06/22/2014. (Next office appointment  06/22/14 at  2:15pm)    Contact information:   Dr. Farley LyAlyssa Papuga, DO (PCP) [facility] [address] [place], [place] [address] [phone]       Discharge Medications    Medication List         acetaminophen 325 MG tablet  Commonly known as:  TYLENOL  Take 2 tablets (650 mg total) by mouth every 4 (four) hours as needed for headache or mild pain.     amLODipine 2.5 MG tablet  Commonly known as:  NORVASC  Take 2.5 mg by mouth daily.     aspirin 81 MG tablet  Take 81 mg by mouth daily.     BYETTA 5 MCG PEN 5 MCG/0.02ML Sopn injection  Generic drug:  exenatide  Inject 10 mcg into the skin 2 (two) times daily with a meal.     carvedilol 25 MG tablet  Commonly known as:  COREG  Take 25 mg by mouth 2 (two) times daily with a meal.     cetirizine 10 MG tablet  Commonly known as:  ZYRTEC  Take 10 mg by mouth daily as needed.     clopidogrel 75 MG tablet  Commonly known as:  PLAVIX  Take 1 tablet (75 mg total) by mouth daily.     ezetimibe 10 MG tablet  Commonly known as:  ZETIA  Take 1 tablet (10 mg total) by mouth daily.     lisinopril 40 MG tablet  Commonly known as:  PRINIVIL,ZESTRIL  Take 40 mg by mouth daily.     pantoprazole 40 MG tablet  Commonly known as:  PROTONIX  Take 1 tablet (40 mg total) by mouth daily.     pravastatin 40 MG tablet  Commonly known as:  PRAVACHOL  Take 40 mg by mouth daily.     sitaGLIPtin-metformin 50-1000 MG per tablet  Commonly known as:  JANUMET  Take 1 tablet by mouth 2 (two) times daily with a meal. HOLD for 2 days, restart on 12/10/2013.       Outstanding Labs/Studies  Right lower extremity arterial Doppler scheduled for October 13.  Duration of Discharge Encounter   Greater  than 30 minutes including physician time.  Signed, Nicolasa Duckinghristopher Bennett Vanscyoc NP 06/22/2014, 11:30 AM

## 2014-06-22 NOTE — Progress Notes (Signed)
Patient Name: Dennis [name] Date of Encounter: 06/22/2014   Principal Problem:   Claudication, class III Active Problems:   PAD (peripheral artery disease)   Type 2 diabetes mellitus not at goal   CAD of artery bypass graft- SVG-PDA DES 08/25/13   Ischemic cardiomyopathy   Essential hypertension   Hyperlipidemia    SUBJECTIVE  Ambulated this AM w/o claudication.  No chest pain or sob.  Left groin feels ok.  Eager to go home.  CURRENT MEDS . amLODipine  2.5 mg Oral Daily  . aspirin EC  325 mg Oral Daily  . aspirin EC  81 mg Oral Daily  . carvedilol  25 mg Oral BID WC  . clopidogrel  75 mg Oral Q breakfast  . exenatide  10 mcg Subcutaneous BID WC  . ezetimibe  10 mg Oral Daily  . insulin aspart  0-15 Units Subcutaneous TID WC  . insulin aspart  0-5 Units Subcutaneous QHS  . lisinopril  40 mg Oral Daily  . pantoprazole  40 mg Oral Daily  . pravastatin  40 mg Oral Daily    OBJECTIVE  Filed Vitals:   06/21/14 2013 06/21/14 2015 06/22/14 0019 06/22/14 0615  BP: 138/76 138/76 140/53 129/76  Pulse: 83 82 88 83  Temp: 98.2 F (36.8 C)  97.7 F (36.5 C) 98.2 F (36.8 C)  TempSrc: Oral  Oral Oral  Resp: 15  17 20   Height:      Weight:   218 lb 11.1 oz (99.2 kg)   SpO2: 94% 93% 98% 94%    Intake/Output Summary (Last 24 hours) at 06/22/14 0719 Last data filed at 06/21/14 2230  Gross per 24 hour  Intake 458.75 ml  Output      0 ml  Net 458.75 ml   Filed Weights   06/21/14 1139 06/22/14 0019  Weight: 218 lb (98.884 kg) 218 lb 11.1 oz (99.2 kg)    PHYSICAL EXAM  General: Pleasant, NAD. Neuro: Alert and oriented X 3. Moves all extremities spontaneously. Psych: Normal affect. HEENT:  Normal  Neck: Supple without bruits or JVD. Lungs:  Resp regular and unlabored, CTA. Heart: Irreg, no s3, s4, or murmurs. Abdomen: Soft, non-tender, non-distended, BS + x 4.  Extremities: No clubbing, cyanosis or edema. DP/PT/Radials 1+ and equal bilaterally.  Left groin w/o  bleeding/bruit/hematoma.  Accessory Clinical Findings  CBC  Recent Labs  06/22/14 0341  WBC 6.1  HGB 9.1*  HCT 28.6*  MCV 74.9*  PLT 241   Basic Metabolic Panel  Recent Labs  06/22/14 0341  NA 137  K 3.8  CL 100  CO2 23  GLUCOSE 291*  BUN 10  CREATININE 1.02  CALCIUM 8.5   Hemoglobin A1C  Recent Labs  06/21/14 1820  HGBA1C 10.4*   TELE  Rsr, pvc's/couplets.  Radiology/Studies  No results found.  ASSESSMENT AND PLAN  1.  Claudication/PAD:  S/p lower extremity angiography and drug eluting balloon angioplasty of the R SFA yesterday.  Ambulated this AM w/o claudication.  Plan d/c today on asa/plavix.  F/U LE doppler next wk in office with APP f/u in 2-3 wks.  2.  CAD:  No chest pain or sob.  Cont asa, bb, statin, zetia.  3.  ICM/Chronic systolic chf:  Euvolemic on exam.  EF 40-45% by echo in 10/2013.  Cont bb/acei.  4.  DM II:  Poorly controlled.  A1c 10.4 by check yesterday.  He is on byetta and janumet @ home and will need early  f/u and adjustment.  Hold janumet for 48 hrs post contrast.  5.  HL:  Cont statin/zetia.  LDL 66 and LFT's wnl 06/15/2014.    Signed, Nicolasa Duckinghristopher Berge NP

## 2014-06-22 NOTE — Progress Notes (Signed)
Inpatient Diabetes Program Recommendations  AACE/ADA: New Consensus Statement on Inpatient Glycemic Control (2013)  Target Ranges:  Prepandial:   less than 140 mg/dL      Peak postprandial:   less than 180 mg/dL (1-2 hours)      Critically ill patients:  140 - 180 mg/dL     Results for Adriana ReamsBELL JR, Jakavion (MRN [id number]) as of 06/22/2014 09:49  Ref. Range 06/22/2014 02:17 06/22/2014 07:24  Glucose-Capillary Latest Range: 70-99 mg/dL 409266 (H) 811289 (H)    Results for Adriana ReamsBELL JR, Tell (MRN [id number]) as of 06/22/2014 09:49  Ref. Range 06/21/2014 18:20  Hemoglobin A1C Latest Range: <5.7 % 10.4 (H)     Patient s/p angiogram.  Possible d/c home today.  Spoke with patient about his elevated A1c of 10.4%.  Patient told me that an A1c of 10% is "pretty normal" for him and "good".  Explained what an A1c is and what it measures.  Reminded patient that his goal A1c is 7% or less per ADA standards to prevent both acute and long-term complications.  Encouraged patient to check his CBGs at least bid at home (fasting and another check within the day) and to record all CBGs in a logbook for his PCP to review.  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  Patient told me he started on Byetta 3 months ago and has a [place] today [facility].  Again stressed to patient the importance of good CBG control.  Patient appreciative of my visit.   Will follow Ambrose FinlandJeannine Johnston Eliya Bubar RN, MSN, CDE Diabetes Coordinator Inpatient Diabetes Program Team Pager: [phone] (8a-10p)

## 2014-06-23 ENCOUNTER — Telehealth: Payer: Self-pay | Admitting: *Deleted

## 2014-06-23 ENCOUNTER — Encounter: Payer: Self-pay | Admitting: *Deleted

## 2014-06-23 NOTE — Progress Notes (Signed)
Stable and ready for discharge

## 2014-06-23 NOTE — Discharge Summary (Signed)
Access site is stable. There is no complication or lab abnormality. Plan discharge.

## 2014-06-23 NOTE — Telephone Encounter (Signed)
I spoke with [employer] and tried to get Mr Bell's pantoprazole approved.  They informed me that none of the PPIs are approved and they will not approve them. I cannot appeal or do anything to make them change their minds.  Patient informed via mychart

## 2014-06-29 ENCOUNTER — Telehealth: Payer: Self-pay | Admitting: *Deleted

## 2014-06-29 ENCOUNTER — Ambulatory Visit (HOSPITAL_COMMUNITY)
Admit: 2014-06-29 | Discharge: 2014-06-29 | Disposition: A | Discharge status: Home / Self Care | Payer: Managed Care, Other (non HMO) | Source: Ambulatory Visit | Attending: Cardiology | Admitting: Cardiology

## 2014-06-29 DIAGNOSIS — I739 Peripheral vascular disease, unspecified: Secondary | ICD-10-CM | POA: Diagnosis present

## 2014-06-29 NOTE — Telephone Encounter (Signed)
Patient walked in with papers releasing him back to work that needed to be filled out.  He had a pv angio with intervention on 06/21/14.  I consulted with Dr Herbie BaltimoreHarding in Dr Hazle CocaBerry's absence.  Dr Herbie BaltimoreHarding gave clearance to return to work tomorrow without restrictions.  Papers filled out and handed to patient.

## 2014-06-29 NOTE — Progress Notes (Signed)
Right Lower Ext. Arterial Duplex Completed. Jameire Kouba, BS, RDMS, RVT  

## 2014-07-03 IMAGING — CR DG CHEST 1V PORT
1 series · 1 of 1 positions shown · non-contrast
Comparison: 05/13/2013

CLINICAL DATA: CABG.

EXAM:
PORTABLE CHEST - 1 VIEW

[AP]
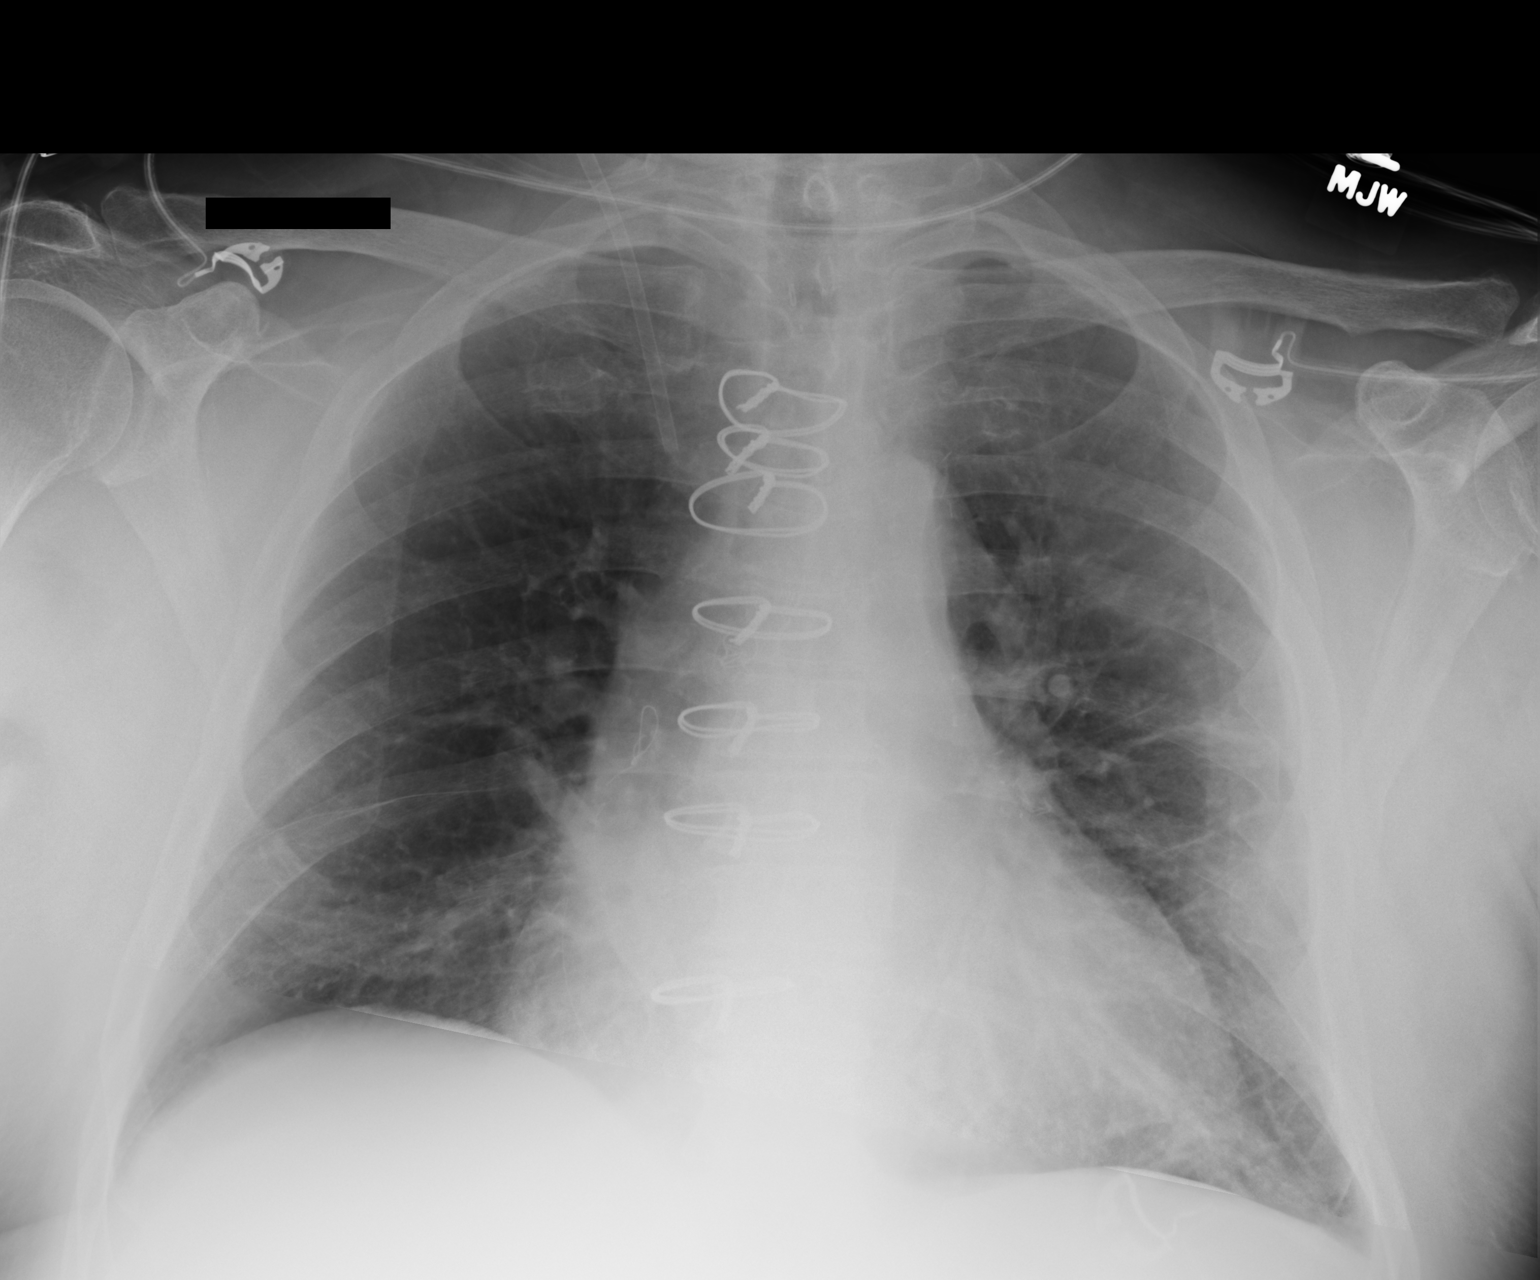

[1 of 1 positions shown; findings below may reference images not displayed]

FINDINGS: Prior CABG. Interval removal of left chest tubes and Swan-Ganz
catheter. No visible pneumothorax. Minimal bibasilar atelectasis,
improved since prior study. Stable mild cardiomegaly.
IMPRESSION: Interval removal of left chest tube without pneumothorax. Improving
bibasilar atelectasis.

## 2014-07-07 ENCOUNTER — Ambulatory Visit (INDEPENDENT_AMBULATORY_CARE_PROVIDER_SITE_OTHER): Payer: Managed Care, Other (non HMO) | Admitting: Cardiology

## 2014-07-07 ENCOUNTER — Encounter: Payer: Self-pay | Admitting: Cardiology

## 2014-07-07 VITALS — BP 128/70 | HR 54 | Ht 69.5 in | Wt 211.5 lb

## 2014-07-07 DIAGNOSIS — E785 Hyperlipidemia, unspecified: Secondary | ICD-10-CM

## 2014-07-07 DIAGNOSIS — I255 Ischemic cardiomyopathy: Secondary | ICD-10-CM

## 2014-07-07 DIAGNOSIS — I2510 Atherosclerotic heart disease of native coronary artery without angina pectoris: Secondary | ICD-10-CM

## 2014-07-07 DIAGNOSIS — E1159 Type 2 diabetes mellitus with other circulatory complications: Secondary | ICD-10-CM

## 2014-07-07 DIAGNOSIS — I10 Essential (primary) hypertension: Secondary | ICD-10-CM

## 2014-07-07 DIAGNOSIS — I739 Peripheral vascular disease, unspecified: Secondary | ICD-10-CM

## 2014-07-07 DIAGNOSIS — I2589 Other forms of chronic ischemic heart disease: Secondary | ICD-10-CM

## 2014-07-07 MED ORDER — CLOPIDOGREL BISULFATE 75 MG PO TABS: 75.0000 mg | ORAL_TABLET | Freq: Every day | ORAL | Status: DC

## 2014-07-07 NOTE — Progress Notes (Signed)
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 He had a 50-100-pack-year history of tobacco abuse, smoking one pack to 2 packs a day. Because of the pt's dyspnea on exertion and multiple cardiac risk factors he had a Myoview which was abnormal. He underwent cath and subsequent  underwent coronary bypass grafting x4 by Dennis. Rexanne ManoBrian [name] on 05/12/13 with a sequential LIMA to the LAD and diagonal branch, vein to obtuse marginal branch and to the PDA. His postop course was uncomplicated.            In Oct 2014 following his coronary artery bypass graft surgery he underwent staged bilateral superficial femoral artery directional atherectomy with marked improvement in his claudication and arterial Doppler studies.            In Dec 2014 he had CHF and was noted to have depressed EF- 20%. He underwent cath and subsequent SVG-PDA DES. He was placed on a Life Vest for 3 months.  F/U echo in Feb 2015 showed improvement in his EF to 40-45%.             In March he was admitted for PTA again on his Lt SFA. He was just admitted for PTA of his Lt SFA which was done 06/21/14. He as done well since discharge. He has not had angina. His claudication and his dopplers are improved. He did describe numbness and tingling in his fingers when he was making a long distance run [place] [name] [employer] recently when the weather was cold. He had similar symptoms in the past and was put on low dose Amlodipine (presumably for Raynaud Syndrome). He says he stopped smoking after his CABG in Aug 2014.    Current Outpatient Prescriptions  Medication Sig Dispense Refill  . acetaminophen (TYLENOL) 325 MG tablet Take 2 tablets (650 mg total) by mouth every 4 (four) hours as needed for headache or mild  pain.      Marland Kitchen. amLODipine (NORVASC) 2.5 MG tablet Take 2.5 mg by mouth daily.      Marland Kitchen. aspirin 81 MG tablet Take 81 mg by mouth daily.      . carvedilol (COREG) 25 MG tablet Take 25 mg by mouth 2 (two) times daily with a meal.      . cetirizine (ZYRTEC) 10 MG tablet Take 10 mg by mouth daily as needed.       . clopidogrel (PLAVIX) 75 MG tablet Take 1 tablet (75 mg total) by mouth daily.  30 tablet  6  . exenatide (BYETTA 5 MCG PEN) 5 MCG/0.02ML SOPN injection Inject 10 mcg into the skin 2 (two) times daily with a meal.       . ezetimibe (ZETIA) 10 MG tablet Take 1 tablet (10 mg total) by mouth daily.  90 tablet  3  . furosemide (LASIX) 40 MG tablet Take 40 mg by mouth daily.      Marland Kitchen. glipiZIDE (GLUCOTROL XL) 10 MG 24 hr tablet Take 1 tablet by mouth daily.      . Liraglutide 18 MG/3ML SOPN Inject 5 mLs as directed 2 (two) times daily.      Marland Kitchen. lisinopril (PRINIVIL,ZESTRIL) 40 MG tablet Take 40 mg by mouth daily.       . magnesium  oxide (MAGOX 400) 400 (241.3 MG) MG tablet Take 400 mg by mouth daily.      . pantoprazole (PROTONIX) 40 MG tablet Take 1 tablet (40 mg total) by mouth daily.  30 tablet  11  . potassium chloride SA (K-DUR,KLOR-CON) 20 MEQ tablet Take 20 mEq by mouth daily.      . pravastatin (PRAVACHOL) 40 MG tablet Take 40 mg by mouth daily.       . sitaGLIPtin-metformin (JANUMET) 50-1000 MG per tablet Take 1 tablet by mouth 2 (two) times daily with a meal. HOLD for 2 days, restart on 12/10/2013.       No current facility-administered medications for this visit.    Allergies  Allergen Reactions  . Other Other (See Comments)    Seasonal  Nasal congestion   . Chlorhexidine Rash    History   Social History  . Marital Status: Married    Spouse Name: N/A    Number of Children: N/A  . Years of Education: N/A   Occupational History  . Not on file.   Social History Main Topics  . Smoking status: Former Smoker -- 1.00 packs/day for 55 years    Types: Cigarettes    Quit date:  05/05/2013  . Smokeless tobacco: Never Used     Comment: 12/07/2013 now using E- Cig.  . Alcohol Use: No     Comment: 12/07/2013 "I'll have a beer once in a blue moon"  . Drug Use: No  . Sexual Activity: Not Currently   Other Topics Concern  . Not on file   Social History Narrative  . No narrative on file     Review of Systems: General: negative for chills, fever, night sweats or weight changes.  Cardiovascular: negative for chest pain, dyspnea on exertion, edema, orthopnea, palpitations, paroxysmal nocturnal dyspnea or shortness of breath Dermatological: negative for rash Respiratory: negative for cough or wheezing Urologic: negative for hematuria Abdominal: negative for nausea, vomiting, diarrhea, bright red blood per rectum, melena, or hematemesis Neurologic: negative for visual changes, syncope, or dizziness All other systems reviewed and are otherwise negative except as noted above.    Blood pressure 128/70, pulse 54, height 5' 9.5" (1.765 m), weight 211 lb 8 oz (95.936 kg).  General appearance: alert, cooperative and no distress Neck: no carotid bruit and no JVD Lungs: clear to auscultation bilaterally Heart: regular rate and rhythm Extremities: no obvious blancing of his fingers. Both LE are warm to touch   ASSESSMENT AND PLAN:   PVD - bilat SFA disease - s/p multiple PTA Bilateral SFA PTA Oct 2014, Lt SFA PTA March 2015, Rt SFA PTA Oct 2015. He is here now s/p Rt SFA 06/21/14. His f/u dopplers were OK.  CAD of artery bypass graft- SVG-PDA DES 08/25/13 CABG x 20 Apr 2013, SVG-PDA stent Dec 2014. No angina  Type 2 diabetes mellitus with circulatory disorder .  Essential hypertension Controlled  Hyperlipidemia On treatment  Ischemic cardiomyopathy His last EF was 40-45% Feb 2015. No CHF symptoms    PLAN  I suspect he has some degree of Raynaud Syndrome. He asked about increasing his Norvasc for this but I did not want to do this as he is on multiple other  antihypertensives. I suggested he wear good gloves in cold weather if he must be working. F/U Dennis [name] in 6 months.   Callia Swim KPA-C 07/07/2014 4:28 PM

## 2014-07-07 NOTE — Assessment & Plan Note (Signed)
His last EF was 40-45% Feb 2015. No CHF symptoms

## 2014-07-07 NOTE — Assessment & Plan Note (Signed)
Bilateral SFA PTA Oct 2014, Lt SFA PTA March 2015, [place] P[place] Oct 2015. He is here now s/p Rt SFA 06/21/14. His f/u dopplers were OK.

## 2014-07-07 NOTE — Assessment & Plan Note (Signed)
Controlled.  

## 2014-07-07 NOTE — Assessment & Plan Note (Signed)
On treatment.  

## 2014-07-07 NOTE — Patient Instructions (Signed)
Your physician recommends that you schedule a follow-up appointment in: 6 Months with Dr Berry    

## 2014-07-07 NOTE — Assessment & Plan Note (Addendum)
CABG x 20 Apr 2013, SVG-PDA stent Dec 2014. No angina

## 2014-07-13 ENCOUNTER — Telehealth: Payer: Self-pay | Admitting: *Deleted

## 2014-07-13 DIAGNOSIS — I739 Peripheral vascular disease, unspecified: Secondary | ICD-10-CM

## 2014-07-13 NOTE — Telephone Encounter (Signed)
Order placed for repeat lower extremity arterial doppler in 6 months  

## 2014-07-13 NOTE — Telephone Encounter (Signed)
Message copied by Marella BileVOGEL, Pollie Poma W. on Tue Jul 13, 2014 12:48 PM ------      Message from: Runell GessBERRY, JONATHAN J      Created: Sat Jul 10, 2014  2:01 PM       Improved RABI s/p intervention. Repeat 6 months ------

## 2014-07-21 ENCOUNTER — Other Ambulatory Visit: Payer: Self-pay

## 2014-07-21 MED ORDER — PANTOPRAZOLE SODIUM 40 MG PO TBEC: 40.0000 mg | DELAYED_RELEASE_TABLET | Freq: Every day | ORAL | Status: DC

## 2014-07-21 NOTE — Telephone Encounter (Signed)
Rx sent to pharmacy   

## 2014-07-30 DIAGNOSIS — D509 Iron deficiency anemia, unspecified: Secondary | ICD-10-CM | POA: Insufficient documentation

## 2014-08-09 ENCOUNTER — Telehealth: Payer: Self-pay | Admitting: *Deleted

## 2014-08-09 NOTE — Telephone Encounter (Signed)
Patient walked in and dropped off insurance form from [employer] [place].  The form has been filled out by the patient and I do not see anywhere for Dr Allyson SabalBerry to fill anything out. I left a message for patient to return my call.

## 2014-08-11 NOTE — Telephone Encounter (Signed)
Patient had dropped off form from [facility] regarding disabiliity coverage.  Was advised patient was contact by Neill LoftK Vogel, RN regarding form and patient stated [employer] was wanting records.  Advised patient that we have not received any correspondence from [facility] requesting records or any forms to be filled out regarding disability.  He said he would contact [facility] to get them to submit requests.  York SpanielSaid may be first of next week before he would be able to contact.

## 2014-08-11 NOTE — Telephone Encounter (Signed)
Returning your call. °

## 2014-08-11 NOTE — Telephone Encounter (Signed)
I spoke with patient. He doesn't need anything filled out.  He needs records from Aug 2014.  I will defer to medical records.  I will hand medical records the form that he brought to me.

## 2014-08-26 ENCOUNTER — Encounter (HOSPITAL_COMMUNITY): Payer: Self-pay | Admitting: Cardiovascular Disease

## 2014-09-21 ENCOUNTER — Telehealth: Payer: Self-pay | Admitting: *Deleted

## 2014-09-21 NOTE — Telephone Encounter (Signed)
Called to get prior auth for pantoprazole, there are no PPI's covered on the patients plan. Discussed with the patient, he is paying out of pocket for this medicine.

## 2014-12-06 ENCOUNTER — Other Ambulatory Visit: Payer: Self-pay | Admitting: Cardiovascular Disease

## 2014-12-07 NOTE — Telephone Encounter (Signed)
Rx refill sent to patient pharmacy   

## 2015-01-19 ENCOUNTER — Telehealth: Payer: Self-pay | Admitting: Cardiovascular Disease

## 2015-01-19 NOTE — Telephone Encounter (Signed)
Received records from [facility] for appointment with Dr Allyson SabalBerry on 03/16/15.  [facility] ([facility]) for Dr Hazle CocaBerry's schedule on 03/16/15. lp

## 2015-03-16 ENCOUNTER — Ambulatory Visit: Payer: Managed Care, Other (non HMO) | Admitting: Cardiovascular Disease

## 2015-03-17 ENCOUNTER — Encounter: Payer: Self-pay | Admitting: Cardiovascular Disease

## 2015-04-01 ENCOUNTER — Encounter: Payer: Self-pay | Admitting: Cardiovascular Disease

## 2015-04-06 ENCOUNTER — Telehealth: Payer: Self-pay | Admitting: Cardiovascular Disease

## 2015-04-06 NOTE — Telephone Encounter (Signed)
Closed encounter °

## 2015-04-15 ENCOUNTER — Encounter: Payer: Self-pay | Admitting: Cardiovascular Disease

## 2015-04-15 ENCOUNTER — Ambulatory Visit (INDEPENDENT_AMBULATORY_CARE_PROVIDER_SITE_OTHER): Payer: Medicare HMO | Admitting: Cardiovascular Disease

## 2015-04-15 VITALS — BP 142/72 | HR 48 | Ht 69.5 in | Wt 216.0 lb

## 2015-04-15 DIAGNOSIS — Z79899 Other long term (current) drug therapy: Secondary | ICD-10-CM

## 2015-04-15 DIAGNOSIS — E785 Hyperlipidemia, unspecified: Secondary | ICD-10-CM | POA: Diagnosis not present

## 2015-04-15 DIAGNOSIS — I10 Essential (primary) hypertension: Secondary | ICD-10-CM

## 2015-04-15 DIAGNOSIS — I2510 Atherosclerotic heart disease of native coronary artery without angina pectoris: Secondary | ICD-10-CM

## 2015-04-15 DIAGNOSIS — I739 Peripheral vascular disease, unspecified: Secondary | ICD-10-CM | POA: Diagnosis not present

## 2015-04-15 DIAGNOSIS — I2583 Coronary atherosclerosis due to lipid rich plaque: Secondary | ICD-10-CM

## 2015-04-15 MED ORDER — ATORVASTATIN CALCIUM 80 MG PO TABS: 80.0000 mg | ORAL_TABLET | Freq: Every day | ORAL | Status: DC

## 2015-04-15 MED ORDER — AMLODIPINE BESYLATE 5 MG PO TABS: ORAL_TABLET | ORAL | Status: DC

## 2015-04-15 MED ORDER — CO Q10 200 MG PO CAPS: 200.0000 mg | ORAL_CAPSULE | Freq: Every day | ORAL | Status: DC

## 2015-04-15 NOTE — Assessment & Plan Note (Signed)
History of hyperlipidemia on pravastatin 40 with his most recent lipid profile performed 02/28/15 by his primary care physician revealing a total social 204, LDL 1:15 and HDL of 37 with a triglyceride level of 359. He does admit to dietary indiscretion. I'm going to change his pravastatin to atorvastatin 80 mg a day and add Co Q 10  a day. Talked about dietary modification. I will check a lipid and liver profile in 2 months

## 2015-04-15 NOTE — Patient Instructions (Signed)
Medication Instructions:   Increase Amlodipine to  daily Stop Pravastatin Start Atrovastatin  daily Start Co Q10  daily  Labwork:  A FASTING lipid profile: to be done in 2 months.  There is a [employer] operational officer lab on the first floor of this building, [address].  They are open from 8am-5pm with a lunch from 12-2.  You do not need an appointment.    Testing/Procedures:  Lower extremity arterial doppler- During this test, ultrasound is used to evaluate arterial blood flow in the legs. Allow approximately one hour for this exam.     Follow-Up:  6 months with Dr [name]   Any Other Special Instructions Will Be Listed Below (If Applicable).

## 2015-04-15 NOTE — Assessment & Plan Note (Signed)
History of hypertension with blood pressure measured at 142/72. He is on amlodipine 2.5 mg a day, carvedilol 12.5 mg by mouth twice a day and lisinopril 40 mg a day. I'm going to increase his amlodipine to 5 mg a day.

## 2015-04-15 NOTE — Assessment & Plan Note (Addendum)
History of staged bilateral SFA directional arthrectomy and drug-eluting balloon angioplasty  with marked improvement in his claudication symptoms. Last Dopplers performed in October revealed ABIs in the midpoint 70 range bilaterally. We will repeat his lower extremity arterial Doppler studies.

## 2015-04-15 NOTE — Assessment & Plan Note (Signed)
History of CAD status post coronary artery bypass grafting by Dr. Laneta Simmers 05/12/13 with a sequential LIMA to the LAD and diagonal branch, vein to obtuse marginal branch and the PDA. He did have intervention subsequent to that. His EF was 20-25% and he did wear a life vest and ultimately his EF improved to 40-45%. He denies chest pain or shortness of breath.

## 2015-04-15 NOTE — Progress Notes (Signed)
04/15/2015 [name]   [birth year]  [id number]  Primary Physician Pcp Not In System Primary Cardiologist: [name] MD Roseanne Reno   HPI:  [name] is a 69 year old married Caucasian male father of one, grandmother, grandfather is accompanied by his wife today. I last saw him in the office 06/15/14. He was referred by [facility] at [facility] for evaluation of claudication and arterial Doppler studies which were obtained in our office 04/07/13. His cardiovascular risk factors include type 2 diabetes, hypertension, and hyperlipidemia. He has a 50-100-pack-year history of tobacco abuse currently smoking one pack to 2 packs a day. There is no family history of heart disease. He's never had a heart attack or stroke. He does complain of dyspnea on exertion. He has had claudication for the last 2 years worse over the 3 months Prior to initially seen me which was now lifestyle limiting. Doppler studies in our office performed 04/07/13 revealed a right ABI of 0.63 and a left ABI of 0.70. He had high-grade SFA disease bilaterally as well as tibial disease.because of a positive Myoview stress test the patient first had a diagnostic coronary arteriogram. Which showed 3 vessel disease with moderate LV dysfunction. He'll underwent coronary bypass grafting x4 by Dr. [name] on 05/12/13 with a sequential LIMA to the LAD and diagonal branch, vein to obtuse marginal branch and to the PDA. His postop course was uncomplicated. Following his coronary artery bypass graft surgery he underwent staged bilateral superficial femoral artery directional atherectomy with marked improvement in his claudication and arterial Doppler studies. This week he developed shortness of breath and was seen in an emergency room and diagnosed with congestive heart failure. He was treated with Lasix and referred back to [place] for further evaluation. He saw [name] in our office yesterday who did a 2-D  echocardiogram today that revealed an EF of 20-25%. This is a finding now almost 3 months status post coronary revascularization.  He wore a LifeVest for 3 months because of a ejection fraction of 20-25% with global hypokinesia. A Myoview stress test showed inferior scar. [name] underwent cardiac catheterization by myself 08/25/13  with a high-grade stenosis within his PDA graft was stented. His ejection fraction improved to 40-45% negating the need for ICD therapy. . Bilateral lower extremity pain similar to this preintervention symptoms. Followup Dopplers confirmed recurrent disease.on 11/16/13 he underwent re\re angiography, TurboHAWK directional arthrectomy, PTA of distal right SFA with drug-eluting balloon. He had a excellent angiographic and clinical result. He had residual disease in the left left lower extremity lifestyle limiting claudication.on 12/07/13 he underwent directional atherectomy using block of his proximal left SFA. He does have residual 60% segmental disease in the mid and distal left SFA with one vessel runoff via the perineal. followup Doppler studies performed 12/11/13 revealed a right ABI 0.96 and a left ABI of 0.83. Since I saw him 6 months ago he's done relatively well has been stable from a heart point of view.  I performed bilateral SFA directional atherectomy with drug eluting blanch plasty resulting in improvement in his symptoms of claudication and his Doppler studies. His last Doppler performed in October revealed ABIs in the mid 0.7 range. He denies chest pain or shortness of breath. He has retired since I last saw him because of poor eyesight. Recent blood work performed by his PCP 02/28/15 revealed a total cholesterol 204, LDL 1:15 and HDL 37 with a tricuspid level of 359. He is on  pravastatin 40 mg a day.   Current Outpatient Prescriptions  Medication Sig Dispense Refill  . acetaminophen (TYLENOL) 325 MG tablet Take 2 tablets (650 mg total) by mouth every 4 (four) hours as  needed for headache or mild pain.    Marland Kitchen amLODipine (NORVASC) 2.5 MG tablet TAKE ONE TABLET BY MOUTH ONCE DAILY 30 tablet 7  . aspirin 81 MG tablet Take 81 mg by mouth daily.    . carvedilol (COREG) 12.5 MG tablet Take 12.5 mg by mouth 2 (two) times daily with a meal.    . cetirizine (ZYRTEC) 10 MG tablet Take 10 mg by mouth daily as needed.     . clopidogrel (PLAVIX) 75 MG tablet Take 1 tablet (75 mg total) by mouth daily. 90 tablet 3  . furosemide (LASIX) 20 MG tablet Take 20 mg by mouth daily.    . insulin glargine (LANTUS) 100 UNIT/ML injection Inject 45 Units into the skin at bedtime.    Marland Kitchen lisinopril (PRINIVIL,ZESTRIL) 40 MG tablet Take 40 mg by mouth daily.     . magnesium oxide (MAGOX 400) 400 (241.3 MG) MG tablet Take 400 mg by mouth daily.    . metFORMIN (GLUCOPHAGE) 1000 MG tablet Take 1,000 mg by mouth 2 (two) times daily.    . pantoprazole (PROTONIX) 40 MG tablet Take 40 mg by mouth 2 (two) times daily before a meal.    . atorvastatin (LIPITOR) 80 MG tablet Take 1 tablet (80 mg total) by mouth daily. 90 tablet 3  . Coenzyme Q10 (CO Q10) 200 MG CAPS Take 200 mg by mouth daily. 30 capsule 6   No current facility-administered medications for this visit.    Allergies  Allergen Reactions  . Other Other (See Comments)    Seasonal  Nasal congestion   . Chlorhexidine Rash    History   Social History  . Marital Status: Married    Spouse Name: N/A  . Number of Children: N/A  . Years of Education: N/A   Occupational History  . Not on file.   Social History Main Topics  . Smoking status: Former Smoker -- 1.00 packs/day for 55 years    Types: Cigarettes    Quit date: 05/05/2013  . Smokeless tobacco: Never Used     Comment: 12/07/2013 now using E- Cig.  . Alcohol Use: No     Comment: 12/07/2013 "I'll have a beer once in a blue moon"  . Drug Use: No  . Sexual Activity: Not Currently   Other Topics Concern  . Not on file   Social History Narrative     Review of  Systems: General: negative for chills, fever, night sweats or weight changes.  Cardiovascular: negative for chest pain, dyspnea on exertion, edema, orthopnea, palpitations, paroxysmal nocturnal dyspnea or shortness of breath Dermatological: negative for rash Respiratory: negative for cough or wheezing Urologic: negative for hematuria Abdominal: negative for nausea, vomiting, diarrhea, bright red blood per rectum, melena, or hematemesis Neurologic: negative for visual changes, syncope, or dizziness All other systems reviewed and are otherwise negative except as noted above.    Blood pressure 142/72, pulse 48, height 5' 9.5" (1.765 m), weight 216 lb (97.977 kg).  General appearance: alert and no distress Neck: no adenopathy, no carotid bruit, no JVD, supple, symmetrical, trachea midline and thyroid not enlarged, symmetric, no tenderness/mass/nodules Lungs: clear to auscultation bilaterally Heart: regular rate and rhythm, S1, S2 normal, no murmur, click, rub or gallop Extremities: extremities normal, atraumatic, no cyanosis or edema  EKG  sinus bradycardia 48 with evidence of left ventricle hypertrophy with repolarization changes unchanged from prior EKGs. I personally reviewed this EKG  ASSESSMENT AND PLAN:   PVD - bilat SFA disease - s/p multiple PTA History of staged bilateral SFA directional arthrectomy and drug-eluting balloon angioplasty  with marked improvement in his claudication symptoms. Last Dopplers performed in October revealed ABIs in the midpoint 70 range bilaterally. We will repeat his lower extremity arterial Doppler studies.  Ischemic cardiomyopathy History of CAD status post coronary artery bypass grafting by Dr. Laneta Simmers 05/12/13 with a sequential LIMA to the LAD and diagonal branch, vein to obtuse marginal branch and the PDA. He did have intervention subsequent to that. His EF was 20-25% and he did wear a life vest and ultimately his EF improved to 40-45%. He denies chest  pain or shortness of breath.  Hyperlipidemia History of hyperlipidemia on pravastatin 40 with his most recent lipid profile performed 02/28/15 by his primary care physician revealing a total social 204, LDL 1:15 and HDL of 37 with a triglyceride level of 359. He does admit to dietary indiscretion. I'm going to change his pravastatin to atorvastatin 80 mg a day and add Co Q 10 200mg  a day. Talked about dietary modification. I will check a lipid and liver profile in 2 months  Essential hypertension History of hypertension with blood pressure measured at 142/72. He is on amlodipine 2.5 mg a day, carvedilol 12.5 mg by mouth twice a day and lisinopril 40 mg a day. I'm going to increase his amlodipine to 5 mg a day.      [name] MD FACP,FACC,FAHA, [facility] 04/15/2015 9:53 AM

## 2015-04-21 ENCOUNTER — Other Ambulatory Visit: Payer: Self-pay | Admitting: Cardiovascular Disease

## 2015-04-21 DIAGNOSIS — I739 Peripheral vascular disease, unspecified: Secondary | ICD-10-CM

## 2015-04-27 ENCOUNTER — Other Ambulatory Visit: Payer: Self-pay | Admitting: Cardiovascular Disease

## 2015-04-27 ENCOUNTER — Ambulatory Visit (HOSPITAL_COMMUNITY)
Admission: RE | Admit: 2015-04-27 | Discharge: 2015-04-27 | Disposition: A | Discharge status: Home / Self Care | Payer: Medicare HMO | Source: Ambulatory Visit | Attending: Internal Medicine | Admitting: Internal Medicine

## 2015-04-27 DIAGNOSIS — I779 Disorder of arteries and arterioles, unspecified: Secondary | ICD-10-CM

## 2015-04-27 DIAGNOSIS — I739 Peripheral vascular disease, unspecified: Secondary | ICD-10-CM | POA: Insufficient documentation

## 2015-10-25 ENCOUNTER — Encounter: Payer: Self-pay | Admitting: Internal Medicine

## 2015-10-25 ENCOUNTER — Ambulatory Visit (INDEPENDENT_AMBULATORY_CARE_PROVIDER_SITE_OTHER): Payer: Medicare HMO | Admitting: Internal Medicine

## 2015-10-25 VITALS — BP 100/62 | HR 50 | Ht 69.0 in | Wt 226.3 lb

## 2015-10-25 DIAGNOSIS — M79604 Pain in right leg: Secondary | ICD-10-CM | POA: Diagnosis not present

## 2015-10-25 DIAGNOSIS — I739 Peripheral vascular disease, unspecified: Secondary | ICD-10-CM | POA: Diagnosis not present

## 2015-10-25 DIAGNOSIS — M792 Neuralgia and neuritis, unspecified: Secondary | ICD-10-CM | POA: Diagnosis not present

## 2015-10-25 DIAGNOSIS — M79605 Pain in left leg: Secondary | ICD-10-CM
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
## 2015-10-25 NOTE — Patient Instructions (Signed)
Dr Rennis Golden has made no changes to your current medications.  Your physician recommends that you schedule a follow-up appointment first available with Dr [name].  If you need a refill on your cardiac medications before your next appointment, please call your pharmacy.

## 2015-11-04 NOTE — Addendum Note (Signed)
Addended by: Neta Ehlers on: 11/04/2015 11:45 AM   Modules accepted: Orders

## 2015-11-23 ENCOUNTER — Ambulatory Visit: Payer: Medicare HMO | Admitting: Cardiovascular Disease

## 2015-11-25 ENCOUNTER — Ambulatory Visit: Payer: Medicare HMO | Admitting: Cardiovascular Disease

## 2015-12-14 DIAGNOSIS — E11311 Type 2 diabetes mellitus with unspecified diabetic retinopathy with macular edema: Secondary | ICD-10-CM | POA: Insufficient documentation

## 2016-01-02 ENCOUNTER — Other Ambulatory Visit: Payer: Self-pay | Admitting: Cardiovascular Disease

## 2016-01-02 DIAGNOSIS — I739 Peripheral vascular disease, unspecified: Secondary | ICD-10-CM

## 2016-01-10 ENCOUNTER — Ambulatory Visit (HOSPITAL_COMMUNITY)
Admission: RE | Admit: 2016-01-10 | Discharge: 2016-01-10 | Disposition: A | Discharge status: Home / Self Care | Payer: Medicare HMO | Source: Ambulatory Visit | Attending: Cardiovascular Disease | Admitting: Cardiovascular Disease

## 2016-01-10 DIAGNOSIS — I70203 Unspecified atherosclerosis of native arteries of extremities, bilateral legs: Secondary | ICD-10-CM | POA: Insufficient documentation

## 2016-01-10 DIAGNOSIS — E119 Type 2 diabetes mellitus without complications: Secondary | ICD-10-CM | POA: Insufficient documentation

## 2016-01-10 DIAGNOSIS — I739 Peripheral vascular disease, unspecified: Secondary | ICD-10-CM

## 2016-01-10 DIAGNOSIS — I2510 Atherosclerotic heart disease of native coronary artery without angina pectoris: Secondary | ICD-10-CM | POA: Diagnosis not present

## 2016-01-10 DIAGNOSIS — E785 Hyperlipidemia, unspecified: Secondary | ICD-10-CM | POA: Diagnosis not present

## 2016-01-10 DIAGNOSIS — Z87891 Personal history of nicotine dependence: Secondary | ICD-10-CM | POA: Insufficient documentation

## 2016-01-10 DIAGNOSIS — I10 Essential (primary) hypertension: Secondary | ICD-10-CM | POA: Diagnosis not present

## 2016-01-13 ENCOUNTER — Telehealth: Payer: Self-pay

## 2016-01-13 DIAGNOSIS — I739 Peripheral vascular disease, unspecified: Secondary | ICD-10-CM

## 2016-01-13 NOTE — Telephone Encounter (Signed)
-----   Message from Runell GessJonathan J Berry, MD sent at 01/13/2016  9:01 AM EDT ----- No change from prior study. Repeat in 12 months.

## 2016-01-16 DIAGNOSIS — N529 Male erectile dysfunction, unspecified: Secondary | ICD-10-CM | POA: Insufficient documentation

## 2016-01-19 DIAGNOSIS — H61001 Unspecified perichondritis of right external ear: Secondary | ICD-10-CM | POA: Insufficient documentation

## 2016-01-28 ENCOUNTER — Encounter: Payer: Self-pay | Admitting: Cardiovascular Disease

## 2016-01-31 ENCOUNTER — Encounter: Payer: Self-pay | Admitting: Cardiovascular Disease

## 2016-02-17 DIAGNOSIS — E113319 Type 2 diabetes mellitus with moderate nonproliferative diabetic retinopathy with macular edema, unspecified eye: Secondary | ICD-10-CM | POA: Insufficient documentation

## 2016-02-17 DIAGNOSIS — E291 Testicular hypofunction: Secondary | ICD-10-CM | POA: Insufficient documentation

## 2016-02-21 ENCOUNTER — Ambulatory Visit: Payer: Medicare HMO | Admitting: Cardiovascular Disease

## 2016-04-20 DIAGNOSIS — H6123 Impacted cerumen, bilateral: Secondary | ICD-10-CM | POA: Insufficient documentation

## 2016-07-11 DIAGNOSIS — Z006 Encounter for examination for normal comparison and control in clinical research program: Secondary | ICD-10-CM | POA: Insufficient documentation

## 2016-08-21 DIAGNOSIS — I519 Heart disease, unspecified: Secondary | ICD-10-CM | POA: Insufficient documentation

## 2016-12-19 ENCOUNTER — Other Ambulatory Visit: Payer: Self-pay | Admitting: Cardiovascular Disease

## 2016-12-19 DIAGNOSIS — I739 Peripheral vascular disease, unspecified: Secondary | ICD-10-CM

## 2017-01-14 ENCOUNTER — Ambulatory Visit (HOSPITAL_COMMUNITY)
Admission: RE | Admit: 2017-01-14 | Discharge: 2017-01-14 | Disposition: A | Discharge status: Home / Self Care | Payer: Medicare HMO | Source: Ambulatory Visit | Attending: Internal Medicine | Admitting: Internal Medicine

## 2017-01-14 DIAGNOSIS — I700 Atherosclerosis of aorta: Secondary | ICD-10-CM | POA: Insufficient documentation

## 2017-01-14 DIAGNOSIS — I739 Peripheral vascular disease, unspecified: Secondary | ICD-10-CM

## 2017-01-17 ENCOUNTER — Other Ambulatory Visit: Payer: Self-pay | Admitting: Cardiovascular Disease

## 2017-01-17 DIAGNOSIS — I739 Peripheral vascular disease, unspecified: Secondary | ICD-10-CM

## 2017-01-18 ENCOUNTER — Ambulatory Visit (INDEPENDENT_AMBULATORY_CARE_PROVIDER_SITE_OTHER): Payer: Medicare HMO | Admitting: Cardiovascular Disease

## 2017-01-18 ENCOUNTER — Encounter: Payer: Self-pay | Admitting: Cardiovascular Disease

## 2017-01-18 VITALS — BP 158/98 | HR 86 | Ht 69.5 in | Wt 224.0 lb

## 2017-01-18 DIAGNOSIS — E7800 Pure hypercholesterolemia, unspecified: Secondary | ICD-10-CM | POA: Diagnosis not present

## 2017-01-18 DIAGNOSIS — I739 Peripheral vascular disease, unspecified: Secondary | ICD-10-CM | POA: Diagnosis not present

## 2017-01-18 DIAGNOSIS — I255 Ischemic cardiomyopathy: Secondary | ICD-10-CM

## 2017-01-18 DIAGNOSIS — I2510 Atherosclerotic heart disease of native coronary artery without angina pectoris: Secondary | ICD-10-CM

## 2017-01-18 DIAGNOSIS — I10 Essential (primary) hypertension: Secondary | ICD-10-CM | POA: Diagnosis not present

## 2017-01-18 NOTE — Assessment & Plan Note (Signed)
History of ischemic cardiomyopathy with recent echo [facility] 10/03/16 revealing an ejection fraction of 35-40%. He was initially on carvedilol 25 mg by mouth twice a day which has been down titrated by his PCP now down to 6.25 mg by mouth twice a day because of episodes of hypotension. He may benefit from being on Entresto. I'm going to have to come back to see Belenda CruiseKristin MR Pharm.D., for initiation.

## 2017-01-18 NOTE — Assessment & Plan Note (Signed)
History of essential hypertension with blood pressure measured at 158/98. He checks his blood pressure at home which runs in the 113/80-90 range. He is on carvedilol which has been down titrated by his PCP from 25 twice a day to 6.25 mg by mouth twice a day. He's had several episodes of hypotension which may be related to dehydration. I'm going to have him check his blood pressure on daily basis and keep a log. He will see Belenda CruiseKristin back in the office in one month for review and titration.

## 2017-01-18 NOTE — Assessment & Plan Note (Signed)
History of hyperlipidemia on statin therapy followed by his PCP 

## 2017-01-18 NOTE — Assessment & Plan Note (Signed)
History of type 2 diabetes with a hemoglobin A1c in the 13 range related to dietary indiscretion.

## 2017-01-18 NOTE — Assessment & Plan Note (Signed)
History of peripheral arterial disease status post multiple bilateral SFA interventions most recently 12/07/13. He's had atherectomy and drug-eluting lumen angioplasty of both SFAs. His most recent Dopplers performed in our office 01/14/17 revealed essentially normal ABIs are normal SFAs.

## 2017-01-18 NOTE — Assessment & Plan Note (Addendum)
History of CAD status post bypass grafting X 4 by Dr. Rexanne ManoBrian [name] 05/12/13 with a sequential LIMA to the LAD, diagonal branch, vein to obtuse marginal branch and the PDA. His postoperative course was uncomplicated. His EF was in the 20-25% range. He did wear a life vest. Ultimately his EF improved to 40-45% indicating the need for ICD implantation. His most recent [facility] on 10[facility]03/16 revealed an EF of 35-40%. He denies chest pain but does have symptoms of class 2-3 heart failure. He is on low-dose carvedilol. He's had episodes of hypotension which may be related to dehydration.

## 2017-01-18 NOTE — Progress Notes (Signed)
01/18/2017 Dennis ReamsGeorge [name]   03/22/1946  [id number]  Primary Physician Pcp Not In System Primary Cardiologist: Runell GessJonathan J Jameire Kouba MD Roseanne RenoFACP, FACC, FAHA, FSCAI  HPI:  Mr. Dennis MorinBell is a 71 year old married Caucasian male father of one, grandmother, grandfather is accompanied by his wife today. I last saw him in the office 04/15/15. [facility] for evaluation of claudication and arterial Doppler studies which were obtained in our office 04/07/13. His cardiovascular risk factors include type 2 diabetes, hypertension, and hyperlipidemia. He has a 50-100-pack-year history of tobacco abuse currently smoking one pack to 2 packs a day. There is no family history of heart disease. He's never had a heart attack or stroke. He does complain of dyspnea on exertion. He has had claudication for the last 2 years worse over the 3 months Prior to initially seen me which was now lifestyle limiting. Doppler studies in our office performed 04/07/13 revealed a right ABI of 0.63 and a left ABI of 0.70. He had high-grade SFA disease bilaterally as well as tibial disease.because of a positive Myoview stress test the patient first had a diagnostic coronary arteriogram. Which showed 3 vessel disease with moderate LV dysfunction. He'll underwent coronary bypass grafting x4 by Dr. Rexanne ManoBrian [name] on 05/12/13 with a sequential LIMA to the LAD and diagonal branch, vein to obtuse marginal branch and to the PDA. His postop course was uncomplicated. Following his coronary artery bypass graft surgery he underwent staged bilateral superficial femoral artery directional atherectomy with marked improvement in his claudication and arterial Doppler studies. This week he developed shortness of breath and was seen in an emergency room and diagnosed with congestive heart failure. He was treated with Lasix and referred back to us for further evaluation. He saw Dennis MediciBrittany Simmons PA-C in our office yesterday who did a 2-D  echocardiogram today that revealed an EF of 20-25%. This is a finding now almost 3 months status post coronary revascularization.  He wore a LifeVest for 3 months because of a ejection fraction of 20-25% with global hypokinesia. A Myoview stress test showed inferior scar. [name] underwent cardiac catheterization by myself 08/25/13  with a high-grade stenosis within his PDA graft was stented. His ejection fraction improved to 40-45% negating the need for ICD therapy. . Bilateral lower extremity pain similar to this preintervention symptoms. Followup Dopplers confirmed recurrent disease.on 11/16/13 he underwent re\re angiography, TurboHAWK directional arthrectomy, PTA of distal right SFA with drug-eluting balloon. He had a excellent angiographic and clinical result. He had residual disease in the left left lower extremity lifestyle limiting claudication.on 12/07/13 he underwent directional atherectomy using block of his proximal left SFA. He does have residual 60% segmental disease in the mid and distal left SFA with one vessel runoff via the perineal. followup Doppler studies performed 12/11/13 revealed a right ABI 0.96 and a left ABI of 0.83. Since I saw him 6 months ago he's done relatively well has been stable from a heart point of view.  I performed bilateral SFA directional atherectomy with drug eluting balloon angioplasty resulting in improvement in his symptoms of claudication and his Doppler studies. His last Doppler performed in 01/14/17 revealed normal ABIs and widely patent SFA. He has been briefly hospitalized because of hypotension for unclear reasons. His PCP has down titrated his carvedilol from 25 mg by mouth twice a day down to 6.25 mg by mouth twice a day. [facility] revealed an EF of 35-40%.   Current Outpatient Prescriptions  Medication Sig Dispense Refill  . acetaminophen (TYLENOL) 325 MG tablet Take 2 tablets (650 mg total) by  mouth every 4 (four) hours as needed for headache or mild pain.    Marland Kitchen aspirin 81 MG tablet Take 81 mg by mouth daily.    Marland Kitchen atorvastatin (LIPITOR) 80 MG tablet Take 1 tablet (80 mg total) by mouth daily. 90 tablet 3  . carvedilol (COREG) 12.5 MG tablet Take 12.5 mg by mouth 2 (two) times daily with a meal.    . cetirizine (ZYRTEC) 10 MG tablet Take 10 mg by mouth daily as needed.     . cilostazol (PLETAL) 100 MG tablet Take 1 tablet by mouth 2 (two) times daily.    . clopidogrel (PLAVIX) 75 MG tablet Take 1 tablet (75 mg total) by mouth daily. 90 tablet 3  . furosemide (LASIX) 20 MG tablet Take 20 mg by mouth daily.    Marland Kitchen gabapentin (NEURONTIN) 300 MG capsule Take 300 mg by mouth 3 (three) times daily.    . insulin glargine (LANTUS) 100 UNIT/ML injection Inject 50 Units into the skin at bedtime.     Marland Kitchen levothyroxine (SYNTHROID, LEVOTHROID) 25 MCG tablet Take 25 mcg by mouth daily before breakfast.    . magnesium oxide (MAGOX 400) 400 (241.3 MG) MG tablet Take 400 mg by mouth daily.    . pantoprazole (PROTONIX) 40 MG tablet Take 40 mg by mouth 2 (two) times daily before a meal.     No current facility-administered medications for this visit.     Allergies  Allergen Reactions  . Other Other (See Comments)    Seasonal  Nasal congestion   . Chlorhexidine Rash    Social History   Social History  . Marital status: Married    Spouse name: N/A  . Number of children: N/A  . Years of education: N/A   Occupational History  . Not on file.   Social History Main Topics  . Smoking status: Former Smoker    Packs/day: 1.00    Years: 55.00    Types: Cigarettes    Quit date: 05/05/2013  . Smokeless tobacco: Never Used     Comment: 12/07/2013 now using E- Cig.  . Alcohol use No     Comment: 12/07/2013 "I'll have a beer once in a blue moon"  . Drug use: No  . Sexual activity: Not Currently   Other Topics Concern  . Not on file   Social History Narrative  . No narrative on file     Review  of Systems: General: negative for chills, fever, night sweats or weight changes.  Cardiovascular: negative for chest pain, dyspnea on exertion, edema, orthopnea, palpitations, paroxysmal nocturnal dyspnea or shortness of breath Dermatological: negative for rash Respiratory: negative for cough or wheezing Urologic: negative for hematuria Abdominal: negative for nausea, vomiting, diarrhea, bright red blood per rectum, melena, or hematemesis Neurologic: negative for visual changes, syncope, or dizziness All other systems reviewed and are otherwise negative except as noted above.    Blood pressure (!) 158/98, pulse 86, height 5' 9.5" (1.765 m), weight 224 lb (101.6 kg).  General appearance: alert and no distress Neck: no adenopathy, no carotid bruit, no JVD, supple, symmetrical, trachea midline and thyroid not enlarged, symmetric, no tenderness/mass/nodules Lungs: clear to auscultation bilaterally Heart: regular rate and rhythm, S1, S2 normal, no murmur, click, rub or gallop Extremities: extremities normal, atraumatic, no cyanosis or edema  EKG  sinus rhythm at 86 with nonspecific ST and T-wave changes. I personally reviewed this EKG  ASSESSMENT AND PLAN:   Essential hypertension History of essential hypertension with blood pressure measured at 158/98. He checks his blood pressure at home which runs in the 113/80-90 range. He is on carvedilol which has been down titrated by his PCP from 25 twice a day to 6.25 mg by mouth twice a day. He's had several episodes of hypotension which may be related to dehydration. I'm going to have him check his blood pressure on daily basis and keep a log. He will see Belenda Cruise back in the office in one month for review and titration.  Hyperlipidemia History of hyperlipidemia on statin therapy followed by his PCP  Type 2 diabetes mellitus with circulatory disorder (HCC) History of type 2 diabetes with a hemoglobin A1c in the 13 range related to dietary  indiscretion.  CAD- s/p CABG X 4 05/12/13 History of CAD status post bypass grafting X 4 by Dr. Rexanne [name] 05/12/13 with a sequential LIMA to the LAD, diagonal branch, vein to obtuse marginal branch and the PDA. His postoperative course was uncomplicated. His EF was in the 20-25% range. He did wear a life vest. Ultimately his EF improved to 40-45% indicating the need for ICD implantation. His most recent echo performed at [facility] on 10/03/16 revealed an EF of 35-40%. He denies chest pain but does have symptoms of class 2-3 heart failure. He is on low-dose carvedilol. He's had episodes of hypotension which may be related to dehydration.  PVD - bilat SFA disease - s/p multiple PTA History of peripheral arterial disease status post multiple bilateral SFA interventions most recently 12/07/13. He's had atherectomy and drug-eluting lumen angioplasty of both SFAs. His most recent Dopplers performed in our office 01/14/17 revealed essentially normal ABIs are normal SFAs.  Ischemic cardiomyopathy History of ischemic cardiomyopathy with recent echo performed at [facility] 10/03/16 revealing an ejection fraction of 35-40%. He was initially on carvedilol 25 mg by mouth twice a day which has been down titrated by his PCP now down to 6.25 mg by mouth twice a day because of episodes of hypotension. He may benefit from being on Entresto. I'm going to have to come back to see Belenda Cruise MR Pharm.D., for initiation.      [name] MD FACP,FACC,FAHA, [facility] 01/18/2017 8:34 AM

## 2017-01-18 NOTE — Patient Instructions (Signed)
Medication Instructions: Your physician recommends that you continue on your current medications as directed. Please refer to the Current Medication list given to you today.   Follow-Up: Your physician recommends that you schedule a follow-up appointment in: 1 month with [facility]. Your physician has requested that you regularly monitor and record your blood pressure readings at home. Please use the same machine at the same time of day to check your readings and record them to bring to your follow-up visit. Entresto evaluation.  We request that you follow-up in: 3 months with an extender and in 6 months with Dr San MorelleBerry  You will receive a reminder letter in the mail two months in advance. If you don't receive a letter, please call our office to schedule the follow-up appointment.  If you need a refill on your cardiac medications before your next appointment, please call your pharmacy.

## 2017-02-18 ENCOUNTER — Ambulatory Visit (INDEPENDENT_AMBULATORY_CARE_PROVIDER_SITE_OTHER): Payer: Medicare HMO | Admitting: Pharmacist Clinician (PhC)/ Clinical Pharmacy Specialist

## 2017-02-18 DIAGNOSIS — I255 Ischemic cardiomyopathy: Secondary | ICD-10-CM

## 2017-02-18 MED ORDER — SACUBITRIL-VALSARTAN 24-26 MG PO TABS: 1.0000 | ORAL_TABLET | Freq: Two times a day (BID) | ORAL | 0 refills | Status: DC

## 2017-02-18 NOTE — Assessment & Plan Note (Signed)
Patient with cardiomyopathy and hypertension, now only on carvedilol 6.25 mg bid.  Will add Entresto 24/26 mg bid to his regimen.  He has been advised to continue home BP checks only twice daily and bring his home cuff to next appointment.  If those home readings are in a good range we can up-titrate the Entresto.  I was hesitant to start him on the 49/51 mg dose despite a high home BP average because of the occasional hypotensive episodes.  Gave samples of the Entresto and will see him back in 3 weeks for futher dose adjustments.
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
## 2017-02-18 NOTE — Patient Instructions (Signed)
Return for a a follow up appointment in 2 weeks  Your blood pressure today is 138/78  (goal is < 130/80)   Check your blood pressure at home twice daily and keep record of the readings.  Take your BP meds as follows:  Continue carvedilol 6.25 mg twice daily  Start Entresto 24/26 mg twice daily  Bring all of your meds, your BP cuff and your record of home blood pressures to your next appointment.  Exercise as you're able, try to walk approximately 30 minutes per day.  Keep salt intake to a minimum, especially watch canned and prepared boxed foods.  Eat more fresh fruits and vegetables and fewer canned items.  Avoid eating in fast food restaurants.    HOW TO TAKE YOUR BLOOD PRESSURE: . Rest 5 minutes before taking your blood pressure. .  Don't smoke or drink caffeinated beverages for at least 30 minutes before. . Take your blood pressure before (not after) you eat. . Sit comfortably with your back supported and both feet on the floor (don't cross your legs). . Elevate your arm to heart level on a table or a desk. . Use the proper sized cuff. It should fit smoothly and snugly around your bare upper arm. There should be enough room to slip a fingertip under the cuff. The bottom edge of the cuff should be 1 inch above the crease of the elbow. . Ideally, take 3 measurements at one sitting and record the average.

## 2017-02-19 ENCOUNTER — Encounter: Payer: Self-pay | Admitting: Pharmacist Clinician (PhC)/ Clinical Pharmacy Specialist

## 2017-03-01 DIAGNOSIS — M48061 Spinal stenosis, lumbar region without neurogenic claudication: Secondary | ICD-10-CM | POA: Insufficient documentation

## 2017-03-13 ENCOUNTER — Ambulatory Visit (INDEPENDENT_AMBULATORY_CARE_PROVIDER_SITE_OTHER): Payer: Medicare HMO | Admitting: Pharmacist Clinician (PhC)/ Clinical Pharmacy Specialist

## 2017-03-13 ENCOUNTER — Encounter: Payer: Self-pay | Admitting: Pharmacist Clinician (PhC)/ Clinical Pharmacy Specialist

## 2017-03-13 ENCOUNTER — Ambulatory Visit: Payer: Medicare HMO

## 2017-03-13 DIAGNOSIS — E7800 Pure hypercholesterolemia, unspecified: Secondary | ICD-10-CM

## 2017-03-13 MED ORDER — VALSARTAN 40 MG PO TABS: 40.0000 mg | ORAL_TABLET | Freq: Every day | ORAL | 6 refills | Status: DC

## 2017-03-13 NOTE — Patient Instructions (Signed)
Return for a a follow up appointment with Dennis ShelterLuke [name] on August 3  Your blood pressure today is 112/70   Check your blood pressure at home several days each week and keep record of the readings.  Take your BP meds as follows:   Continue with carvedilol 12.5 mg twice daily  Finish last 3 dose of Entresto, then start the valsartan 40 mg once daily on Friday morning.  Bring all of your meds, your BP cuff and your record of home blood pressures to your next appointment.  Exercise as you're able, try to walk approximately 30 minutes per day.  Keep salt intake to a minimum, especially watch canned and prepared boxed foods.  Eat more fresh fruits and vegetables and fewer canned items.  Avoid eating in fast food restaurants.    HOW TO TAKE YOUR BLOOD PRESSURE: . Rest 5 minutes before taking your blood pressure. .  Don't smoke or drink caffeinated beverages for at least 30 minutes before. . Take your blood pressure before (not after) you eat. . Sit comfortably with your back supported and both feet on the floor (don't cross your legs). . Elevate your arm to heart level on a table or a desk. . Use the proper sized cuff. It should fit smoothly and snugly around your bare upper arm. There should be enough room to slip a fingertip under the cuff. The bottom edge of the cuff should be 1 inch above the crease of the elbow. . Ideally, take 3 measurements at one sitting and record the average.
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
## 2017-03-13 NOTE — Assessment & Plan Note (Signed)
Patient doing well with Sherryll Burgerntresto, however is cost prohibitive to patient.  He is not willing to look into [employer] assistance.  Will switch him to valsartan 40 mg once daily and he will follow up with PA in early August.  The dose may need to be increased to 80 mg, but with his evening home BP readings dropping below 100 systolic on multiple occasions, I would prefer to titrate up slowly.

## 2017-04-19 ENCOUNTER — Ambulatory Visit (INDEPENDENT_AMBULATORY_CARE_PROVIDER_SITE_OTHER): Payer: Medicare HMO | Admitting: Cardiology

## 2017-04-19 ENCOUNTER — Telehealth: Payer: Self-pay

## 2017-04-19 ENCOUNTER — Encounter: Payer: Self-pay | Admitting: Cardiology

## 2017-04-19 VITALS — BP 102/66 | HR 78 | Ht 66.5 in | Wt 215.0 lb

## 2017-04-19 DIAGNOSIS — I255 Ischemic cardiomyopathy: Secondary | ICD-10-CM

## 2017-04-19 DIAGNOSIS — I739 Peripheral vascular disease, unspecified: Secondary | ICD-10-CM

## 2017-04-19 DIAGNOSIS — Z79899 Other long term (current) drug therapy: Secondary | ICD-10-CM

## 2017-04-19 LAB — CBC
HEMOGLOBIN: 12 g/dL — AB (ref 13.0–17.7)
Hematocrit: 38.9 % (ref 37.5–51.0)
MCH: 24.9 pg — ABNORMAL LOW (ref 26.6–33.0)
MCHC: 30.8 g/dL — ABNORMAL LOW (ref 31.5–35.7)
MCV: 81 fL (ref 79–97)
Platelets: 279 10*3/uL (ref 150–379)
RBC: 4.82 x10E6/uL (ref 4.14–5.80)
RDW: 16.3 % — ABNORMAL HIGH (ref 12.3–15.4)
WBC: 7.9 10*3/uL (ref 3.4–10.8)

## 2017-04-19 LAB — BASIC METABOLIC PANEL
BUN / CREAT RATIO: 17 (ref 10–24)
BUN: 29 mg/dL — ABNORMAL HIGH (ref 8–27)
CHLORIDE: 89 mmol/L — AB (ref 96–106)
CO2: 20 mmol/L (ref 20–29)
Calcium: 9.7 mg/dL (ref 8.6–10.2)
Creatinine, Ser: 1.74 mg/dL — ABNORMAL HIGH (ref 0.76–1.27)
GFR calc Af Amer: 45 mL/min/{1.73_m2} — ABNORMAL LOW (ref >59)
GFR, EST NON AFRICAN AMERICAN: 39 mL/min/{1.73_m2} — AB (ref >59)
Glucose: 660 mg/dL (ref 65–99)
Potassium: 5.4 mmol/L — ABNORMAL HIGH (ref 3.5–5.2)
Sodium: 127 mmol/L — ABNORMAL LOW (ref 134–144)

## 2017-04-19 MED ORDER — NIACIN ER (ANTIHYPERLIPIDEMIC) 500 MG PO TBCR: 500.0000 mg | EXTENDED_RELEASE_TABLET | Freq: Every day | ORAL | Status: DC

## 2017-04-19 MED ORDER — LOSARTAN POTASSIUM 25 MG PO TABS: 25.0000 mg | ORAL_TABLET | Freq: Every day | ORAL | 3 refills | Status: DC

## 2017-04-19 NOTE — Assessment & Plan Note (Signed)
Bilateral SFA PTA Oct 2014, Lt SFA PTA March 2015, [place] 2015.

## 2017-04-19 NOTE — Assessment & Plan Note (Signed)
Last EF was 35% in [place] [facility]. He was orthostatic then and I believe he was taken off ARB. We tried to add Entresto but he couldn't afford this. His B/P is borderline low- will not add back an ARB at this time. .Marland Kitchen

## 2017-04-19 NOTE — Progress Notes (Signed)
Please let the pt know Dr Allyson SabalBerry reviewed his dopplers from April of this year and there is no significant PAD to explain his claudication symptoms.  Corine ShelterLUKE Geraldo Haris PA-C 04/19/2017 3:53 PM

## 2017-04-19 NOTE — Telephone Encounter (Signed)
Received call from [facility] calling to report elevated glucose 660.[facility].

## 2017-04-19 NOTE — Assessment & Plan Note (Signed)
Pt complaining of bilat LE claudication-

## 2017-04-19 NOTE — Telephone Encounter (Signed)
Per luke repeat BMP in one week and limit high potassium foods.  Pt notified he will come in next Friday and re-draw BMP and wend over 10 top high K+ foods.
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
## 2017-04-19 NOTE — Assessment & Plan Note (Signed)
s/p CABG X 4 05/12/13- no angina

## 2017-04-19 NOTE — Patient Instructions (Addendum)
Medication Instructions:  Your physician recommends that you continue on your current medications as directed. Please refer to the Current Medication list given to you today. If you need a refill on your cardiac medications before your next appointment, please call your pharmacy.  Labwork: BMP AND CBC TODAY HERE IN OUR OFFICE AT [facility]  Follow-Up: Your physician wants you to follow-up in: 6 MONTHS WITH DR Allyson SabalBERRY You should receive a reminder letter in the mail two months in advance. If you do not receive a letter, please call our office November 2018 to schedule the January 2019 follow-up appointment.  Thank you for choosing [facility]!!

## 2017-04-19 NOTE — Addendum Note (Signed)
Addended by: Alyson InglesBROOME, MICHELLE L on: 04/19/2017 04:56 PM   Modules accepted: Orders

## 2017-04-19 NOTE — Telephone Encounter (Signed)
Called pt and informed to call PCP because critical glucose level.  Called Denise-nurse at Dr Cartwright's office and notified to check for critical BMP-glucose lever, it is not resulted in EPIC yet. She states that she will page Dr Blain Paisartwright for further direction. She will check EPIC later for results

## 2017-04-23 ENCOUNTER — Encounter: Payer: Self-pay | Admitting: Cardiovascular Disease

## 2017-04-23 ENCOUNTER — Ambulatory Visit: Payer: Medicare HMO | Admitting: Cardiovascular Disease

## 2017-04-25 NOTE — Progress Notes (Signed)
Recommendations discussed with patient, who verbalized understanding and thanks.  

## 2017-04-26 LAB — BASIC METABOLIC PANEL
BUN/Creatinine Ratio: 13 (ref 10–24)
BUN: 24 mg/dL (ref 8–27)
CO2: 22 mmol/L (ref 20–29)
Calcium: 9.8 mg/dL (ref 8.6–10.2)
Chloride: 101 mmol/L (ref 96–106)
Creatinine, Ser: 1.82 mg/dL — ABNORMAL HIGH (ref 0.76–1.27)
GFR calc Af Amer: 42 mL/min/{1.73_m2} — ABNORMAL LOW (ref >59)
GFR calc non Af Amer: 37 mL/min/{1.73_m2} — ABNORMAL LOW (ref >59)
Glucose: 179 mg/dL — ABNORMAL HIGH (ref 65–99)
Potassium: 4.9 mmol/L (ref 3.5–5.2)
Sodium: 139 mmol/L (ref 134–144)

## 2017-04-29 ENCOUNTER — Encounter: Payer: Self-pay | Admitting: Cardiology

## 2017-04-30 ENCOUNTER — Telehealth: Payer: Self-pay | Admitting: Cardiovascular Disease

## 2017-04-30 DIAGNOSIS — Z79899 Other long term (current) drug therapy: Secondary | ICD-10-CM

## 2017-04-30 NOTE — Telephone Encounter (Signed)
New message    Pt states Eileen StanfordJenna texted him yesterday and it is jumbled up. He wants someone to explain it to him. He could not provide more details but said that it was about labs. He requests a call back.

## 2017-04-30 NOTE — Telephone Encounter (Signed)
Returned call. Reviewed Luke's instructions to reduce lasix from 20mg  daily to 20mg  M,W,F, and to recheck BMP in 1 month. Patient voiced understanding.  He also inquired about the request he sent via mychart last week to get cleared for his lumbar injection. He needs OK from us to HOLD clopidogrel x5-7 days and cilostazol x2 days. Aware this inquiry was sent initially to Dr. Allyson SabalBerry. [facility] as able.

## 2017-05-02 NOTE — Telephone Encounter (Signed)
Called patient and notified him of approval to hold plavix prior to procedure & resume afterward. Patient voiced understanding and thanks. Notes he does not need approval letter sent to ortho as far as he knows, but will call if this is required.

## 2017-05-02 NOTE — Telephone Encounter (Signed)
Yes, OK to hold Plavix as needed for back injection- resume when able post injection.  Corine ShelterLUKE Tomiko Schoon PA-C 05/02/2017 3:07 PM

## 2017-05-25 DIAGNOSIS — Z7189 Other specified counseling: Secondary | ICD-10-CM | POA: Insufficient documentation

## 2017-09-12 DIAGNOSIS — E559 Vitamin D deficiency, unspecified: Secondary | ICD-10-CM | POA: Insufficient documentation

## 2017-09-12 DIAGNOSIS — N183 Chronic kidney disease, stage 3 unspecified: Secondary | ICD-10-CM | POA: Diagnosis present

## 2017-09-12 DIAGNOSIS — N2581 Secondary hyperparathyroidism of renal origin: Secondary | ICD-10-CM | POA: Insufficient documentation

## 2017-09-24 ENCOUNTER — Ambulatory Visit: Payer: Medicare HMO | Admitting: Cardiovascular Disease

## 2017-09-24 ENCOUNTER — Encounter: Payer: Self-pay | Admitting: Cardiovascular Disease

## 2017-09-24 VITALS — BP 114/62 | HR 41 | Ht 66.5 in | Wt 230.0 lb

## 2017-09-24 DIAGNOSIS — I255 Ischemic cardiomyopathy: Secondary | ICD-10-CM | POA: Diagnosis not present

## 2017-09-24 DIAGNOSIS — I10 Essential (primary) hypertension: Secondary | ICD-10-CM | POA: Diagnosis not present

## 2017-09-24 DIAGNOSIS — I739 Peripheral vascular disease, unspecified: Secondary | ICD-10-CM

## 2017-09-24 DIAGNOSIS — R0609 Other forms of dyspnea: Secondary | ICD-10-CM | POA: Diagnosis not present

## 2017-09-24 MED ORDER — CARVEDILOL 6.25 MG PO TABS: 6.2500 mg | ORAL_TABLET | Freq: Two times a day (BID) | ORAL | 6 refills | Status: DC

## 2017-09-24 NOTE — Assessment & Plan Note (Signed)
History of CAD status post coronary artery bypass  Grafting 4 05/12/13  He denies chest pain but  Does complain of  Increasing dyspnea on exertion.

## 2017-09-24 NOTE — Addendum Note (Signed)
Addended by: Evans LanceSTOVER, Cecilia Nishikawa W on: 09/24/2017 10:16 AM   Modules accepted: Orders

## 2017-09-24 NOTE — Progress Notes (Signed)
09/24/2017 Dennis [name]   05/22/1946  [id number]  Primary Physician System, Provider Not In Primary Cardiologist: Runell GessJonathan J Tony Granquist MD Milagros LollFACP, FACC, [place], [place]  HPI:  Dennis [name] is a 72 y.o.  father of one, who last saw him in the office  01/18/17. He was [facility] by [facility] for evaluation of claudication and arterial Doppler studies which were obtained in our office 04/07/13. His cardiovascular risk factors include type 2 diabetes, hypertension, and hyperlipidemia. He has a 50-100-pack-year history of tobacco abuse currently smoking one pack to 2 packs a day. There is no family history of heart disease. He's never had a heart attack or stroke. He does complain of dyspnea on exertion. He has had claudication for the last 2 years worse over the 3 months Prior to initially seen me which was now lifestyle limiting. Doppler studies in our office performed 04/07/13 revealed a right ABI of 0.63 and a left ABI of 0.70. He had high-grade SFA disease bilaterally as well as tibial disease.because of a positive Myoview stress test the patient first had a diagnostic coronary arteriogram. Which showed 3 vessel disease with moderate LV dysfunction. He'll underwent coronary bypass grafting x4 by Dr. Rexanne ManoBrian Bartle on 05/12/13 with a sequential LIMA to the LAD and diagonal branch, vein to obtuse marginal branch and to the PDA. His postop course was uncomplicated. Following his coronary artery bypass graft surgery he underwent staged bilateral superficial femoral artery directional atherectomy with marked improvement in his claudication and arterial Doppler studies. This week he developed shortness of breath and was seen in an emergency room and diagnosed with congestive heart failure. He was treated with Lasix and referred back to us for further evaluation. He saw [name] PA-C in our office yesterday who did a 2-D echocardiogram today that revealed an EF of 20-25%. This is a finding now almost  3 months status post coronary revascularization. He wore a LifeVest for 3 months because of a ejection fraction of 20-25% with global hypokinesia. A Myoview stress test showed inferior scar. [name] underwent cardiac catheterization by myself 08/25/13 with a high-grade stenosis within his PDA graft was stented. His ejection fraction improved to 40-45% negating the need for ICD therapy. . Bilateral lower extremity pain similar to this preintervention symptoms. Followup Dopplers confirmed recurrent disease.on 11/16/13 he underwent re\re angiography, TurboHAWK directional arthrectomy, PTA of distal right SFA with drug-eluting balloon. He had a excellent angiographic and clinical result. He had residual disease in the left left lower extremity lifestyle limiting claudication.on 12/07/13 he underwent directional atherectomy using block of his proximal left SFA. He does have residual 60% segmental disease in the mid and distal left SFA with one vessel runoff via the perineal. followup Doppler studies performed 12/11/13 revealed a right ABI 0.96 and a left ABI of 0.83. Since I saw him 6 months ago he's done relatively well has been stable from a heart point of view.  I performed bilateral SFA directional atherectomy with drug eluting balloon angioplasty resulting in improvement in his symptoms of claudication and his Doppler studies. His last Doppler performed in 01/14/17 revealed normal ABIs and widely patent SFA. He has been briefly hospitalized because of hypotension for unclear reasons. His PCP has down titrated his carvedilol from 25 mg by mouth twice a day down to 6.25 mg by mouth twice a day. [facility] revealed an EF of 35-40%.  Since I  saw him in the office 6 months ago he's had several admissions for your visits over the last month for increasing dyspnea. He was told to double his carvedilol and his furosemide. Today the office is polys 41. He  now has class III symptoms at least but does not have peripheral edema. I'm worried that his EF has worsened and his renal failure has worsened as well.      Current Meds  Medication Sig  . acetaminophen (TYLENOL) 325 MG tablet Take 2 tablets (650 mg total) by mouth every 4 (four) hours as needed for headache or mild pain.  Marland Kitchen aspirin 81 MG tablet Take 81 mg by mouth daily.  Marland Kitchen atorvastatin (LIPITOR) 80 MG tablet Take 1 tablet (80 mg total) by mouth daily.  . carvedilol (COREG) 6.25 MG tablet Take 1 tablet (6.25 mg total) by mouth 2 (two) times daily with a meal.  . cetirizine (ZYRTEC) 10 MG tablet Take 10 mg by mouth daily as needed.   . cilostazol (PLETAL) 100 MG tablet Take 1 tablet by mouth 2 (two) times daily.  . clopidogrel (PLAVIX) 75 MG tablet Take 1 tablet (75 mg total) by mouth daily.  . furosemide (LASIX) 40 MG tablet Take 60 mg by mouth daily.   . insulin glargine (LANTUS) 100 UNIT/ML injection Inject 50 Units into the skin at bedtime.   Marland Kitchen levothyroxine (SYNTHROID, LEVOTHROID) 25 MCG tablet Take 25 mcg by mouth daily before breakfast.  . lisinopril (PRINIVIL,ZESTRIL) 20 MG tablet Take 1 tablet by mouth daily.  . magnesium oxide (MAGOX 400) 400 (241.3 MG) MG tablet Take 400 mg by mouth daily.  . niacin (NIASPAN) 500 MG CR tablet Take 1 tablet (500 mg total) by mouth at bedtime.  . pantoprazole (PROTONIX) 40 MG tablet Take 40 mg by mouth 2 (two) times daily before a meal.  . [DISCONTINUED] carvedilol (COREG) 12.5 MG tablet Take 12.5 mg by mouth 2 (two) times daily with a meal.  . [DISCONTINUED] furosemide (LASIX) 20 MG tablet Take 20 mg by mouth daily.  . [DISCONTINUED] gabapentin (NEURONTIN) 300 MG capsule Take 300 mg by mouth 3 (three) times daily.     Allergies  Allergen Reactions  . Other Other (See Comments)    Seasonal  Nasal congestion   . Chlorhexidine Rash    Social History   Socioeconomic History  . Marital status: Married    Spouse name: Not on file  .  Number of children: Not on file  . Years of education: Not on file  . Highest education level: Not on file  Social Needs  . Financial resource strain: Not on file  . Food insecurity - worry: Not on file  . Food insecurity - inability: Not on file  . Transportation needs - medical: Not on file  . Transportation needs - non-medical: Not on file  Occupational History  . Not on file  Tobacco Use  . Smoking status: Former Smoker    Packs/day: 1.00    Years: 55.00    Pack years: 55.00    Types: Cigarettes    Last attempt to quit: 05/05/2013    Years since quitting: 4.3  . Smokeless tobacco: Never Used  . Tobacco comment: 12/07/2013 now using E- Cig.  Substance and Sexual Activity  . Alcohol use: No    Comment: 12/07/2013 "I'll have a beer once in a blue moon"  . Drug use: No  . Sexual activity: Not Currently  Other Topics Concern  . Not on file  Social History Narrative  . Not on file     Review of Systems: General: negative for chills, fever, night sweats or weight changes.  Cardiovascular: negative for chest pain, dyspnea on exertion, edema, orthopnea, palpitations, paroxysmal nocturnal dyspnea or shortness of breath Dermatological: negative for rash Respiratory: negative for cough or wheezing Urologic: negative for hematuria Abdominal: negative for nausea, vomiting, diarrhea, bright red blood per rectum, melena, or hematemesis Neurologic: negative for visual changes, syncope, or dizziness All other systems reviewed and are otherwise negative except as noted above.    Blood pressure 114/62, pulse (!) 41, height 5' 6.5" (1.689 m), weight 230 lb (104.3 kg).  General appearance: alert and no distress Neck: no adenopathy, no carotid bruit, no JVD, supple, symmetrical, trachea midline and thyroid not enlarged, symmetric, no tenderness/mass/nodules Lungs: clear to auscultation bilaterally Heart: regular rate and rhythm, S1, S2 normal, no murmur, click, rub or gallop Extremities:  extremities normal, atraumatic, no cyanosis or edema Pulses: 2+ and symmetric Skin: Skin color, texture, turgor normal. No rashes or lesions Neurologic: Alert and oriented X 3, normal strength and tone. Normal symmetric reflexes. Normal coordination and gait  EKG sinus bradycardia at 41 with lateral T-wave inversion. I personally reviewed this EKG  ASSESSMENT AND PLAN:   Essential hypertension History of essential hypertension blood pressure measured today at 114/62. He is on carvedilol and lisinopril. He is somewhat bradycardic compared to prior EKGs at 41. I'm going to cut his carvedilol in half from 12.5-6.25 mg by mouth twice a day.  Hyperlipidemia History of hyperlipidemia on statin therapy by his PCP  PVD - bilat SFA disease - s/p multiple PTA History of peripheral arterial disease status post right SFA intervention by myself 06/21/14. He has one vessel runoff bilaterally via peroneal arteries and a 50% mid to distal left SFA stenosis. He does complain of claudication. His last Dopplers performed 01/14/17 revealed fairly normal ABIs bilaterally. He does complain of claudication. I'm going to repeat lower extremity arterial Doppler studies.  Hx of CABG  History of CAD status post coronary artery bypass  Grafting 4 05/12/13  He denies chest pain but  Does complain of  Increasing dyspnea on exertion.  Ischemic cardiomyopathy  History of ischemic cardiomyopathy with ejection fraction most recently checked by 2-D echo 2/1/15of 45%. I suspect this is gotten worse based on his  Increasing symptoms.      [name] MD FACP,FACC,FAHA, [facility] 09/24/2017 9:08 AM

## 2017-09-24 NOTE — Assessment & Plan Note (Signed)
History of peripheral arterial disease status post right SFA intervention by myself 06/21/14. He has one vessel runoff bilaterally via peroneal arteries and a 50% mid to distal left SFA stenosis. He does complain of claudication. His last Dopplers performed 01/14/17 revealed fairly normal ABIs bilaterally. He does complain of claudication. I'm going to repeat lower extremity arterial Doppler studies.

## 2017-09-24 NOTE — Assessment & Plan Note (Signed)
History of hyperlipidemia on statin therapy by his PCP 

## 2017-09-24 NOTE — Patient Instructions (Signed)
Medication Instructions: Your physician recommends that you continue on your current medications as directed. Please refer to the Current Medication list given to you today.  Decrease Carvedilol to 6.25 mg twice daily.   Labwork: Your physician recommends that you return for lab work today: BMET, BNP   Testing/Procedures: Your physician has requested that you have an echocardiogram--URGENT--Need to have done tomorrow. Echocardiography is a painless test that uses sound waves to create images of your heart. It provides your doctor with information about the size and shape of your heart and how well your heart's chambers and valves are working. This procedure takes approximately one hour. There are no restrictions for this procedure.  Your physician has requested that you have a lower extremity arterial duplex. During this test, ultrasound is used to evaluate arterial blood flow in the legs. Allow one hour for this exam. There are no restrictions or special instructions.  Your physician has requested that you have an ankle brachial index (ABI). During this test an ultrasound and blood pressure cuff are used to evaluate the arteries that supply the arms and legs with blood. Allow thirty minutes for this exam. There are no restrictions or special instructions.  Follow-Up: You have been referred to the CHF Clinic---THIS WEEK--URGENT.  Your physician recommends that you schedule a follow-up appointment in: 3 months with Dr. Allyson SabalBerry.  If you need a refill on your cardiac medications before your next appointment, please call your pharmacy.

## 2017-09-24 NOTE — Assessment & Plan Note (Signed)
History of essential hypertension blood pressure measured today at 114/62. He is on carvedilol and lisinopril. He is somewhat bradycardic compared to prior EKGs at 41. I'm going to cut his carvedilol in half from 12.5-6.25 mg by mouth twice a day.

## 2017-09-24 NOTE — Assessment & Plan Note (Signed)
History of ischemic cardiomyopathy with ejection fraction most recently checked by 2-D echo 2/1/15of 45%. I suspect this is gotten worse based on his  Increasing symptoms.

## 2017-09-25 ENCOUNTER — Other Ambulatory Visit: Payer: Self-pay

## 2017-09-25 ENCOUNTER — Ambulatory Visit (HOSPITAL_COMMUNITY): Payer: Medicare HMO | Attending: Cardiovascular Disease

## 2017-09-25 DIAGNOSIS — I255 Ischemic cardiomyopathy: Secondary | ICD-10-CM

## 2017-09-25 DIAGNOSIS — I10 Essential (primary) hypertension: Secondary | ICD-10-CM | POA: Diagnosis not present

## 2017-09-25 DIAGNOSIS — I420 Dilated cardiomyopathy: Secondary | ICD-10-CM | POA: Diagnosis not present

## 2017-09-25 DIAGNOSIS — R0609 Other forms of dyspnea: Secondary | ICD-10-CM | POA: Diagnosis not present

## 2017-09-25 DIAGNOSIS — I5030 Unspecified diastolic (congestive) heart failure: Secondary | ICD-10-CM | POA: Diagnosis not present

## 2017-09-25 LAB — BASIC METABOLIC PANEL
BUN/Creatinine Ratio: 18 (ref 10–24)
BUN: 31 mg/dL — ABNORMAL HIGH (ref 8–27)
CHLORIDE: 103 mmol/L (ref 96–106)
CO2: 22 mmol/L (ref 20–29)
Calcium: 9.8 mg/dL (ref 8.6–10.2)
Creatinine, Ser: 1.74 mg/dL — ABNORMAL HIGH (ref 0.76–1.27)
GFR calc Af Amer: 44 mL/min/{1.73_m2} — ABNORMAL LOW (ref >59)
GFR calc non Af Amer: 38 mL/min/{1.73_m2} — ABNORMAL LOW (ref >59)
Glucose: 87 mg/dL (ref 65–99)
POTASSIUM: 4.6 mmol/L (ref 3.5–5.2)
Sodium: 141 mmol/L (ref 134–144)

## 2017-09-25 LAB — BRAIN NATRIURETIC PEPTIDE: BNP: 672.9 pg/mL — ABNORMAL HIGH (ref 0.0–100.0)

## 2017-09-25 MED ORDER — PERFLUTREN LIPID MICROSPHERE
1.0000 mL | INTRAVENOUS | Status: AC | PRN
  Administered 2017-09-25: 3 mL via INTRAVENOUS

## 2017-10-09 ENCOUNTER — Encounter (HOSPITAL_COMMUNITY): Payer: Self-pay | Admitting: Internal Medicine

## 2017-10-09 ENCOUNTER — Ambulatory Visit (HOSPITAL_COMMUNITY)
Admission: RE | Admit: 2017-10-09 | Discharge: 2017-10-09 | Disposition: A | Discharge status: Home / Self Care | Payer: Medicare HMO | Source: Ambulatory Visit | Attending: Internal Medicine | Admitting: Internal Medicine

## 2017-10-09 ENCOUNTER — Other Ambulatory Visit: Payer: Self-pay

## 2017-10-09 VITALS — BP 190/72 | HR 60 | Wt 235.0 lb

## 2017-10-09 DIAGNOSIS — I5022 Chronic systolic (congestive) heart failure: Secondary | ICD-10-CM

## 2017-10-09 DIAGNOSIS — K219 Gastro-esophageal reflux disease without esophagitis: Secondary | ICD-10-CM | POA: Diagnosis not present

## 2017-10-09 DIAGNOSIS — I10 Essential (primary) hypertension: Secondary | ICD-10-CM

## 2017-10-09 DIAGNOSIS — I739 Peripheral vascular disease, unspecified: Secondary | ICD-10-CM | POA: Diagnosis not present

## 2017-10-09 DIAGNOSIS — Z87891 Personal history of nicotine dependence: Secondary | ICD-10-CM | POA: Insufficient documentation

## 2017-10-09 DIAGNOSIS — E785 Hyperlipidemia, unspecified: Secondary | ICD-10-CM | POA: Diagnosis not present

## 2017-10-09 DIAGNOSIS — Z79899 Other long term (current) drug therapy: Secondary | ICD-10-CM | POA: Insufficient documentation

## 2017-10-09 DIAGNOSIS — M47812 Spondylosis without myelopathy or radiculopathy, cervical region: Secondary | ICD-10-CM | POA: Diagnosis not present

## 2017-10-09 DIAGNOSIS — E1122 Type 2 diabetes mellitus with diabetic chronic kidney disease: Secondary | ICD-10-CM | POA: Insufficient documentation

## 2017-10-09 DIAGNOSIS — Z79890 Hormone replacement therapy (postmenopausal): Secondary | ICD-10-CM | POA: Diagnosis not present

## 2017-10-09 DIAGNOSIS — E669 Obesity, unspecified: Secondary | ICD-10-CM | POA: Insufficient documentation

## 2017-10-09 DIAGNOSIS — M1710 Unilateral primary osteoarthritis, unspecified knee: Secondary | ICD-10-CM | POA: Diagnosis not present

## 2017-10-09 DIAGNOSIS — I130 Hypertensive heart and chronic kidney disease with heart failure and stage 1 through stage 4 chronic kidney disease, or unspecified chronic kidney disease: Secondary | ICD-10-CM | POA: Insufficient documentation

## 2017-10-09 DIAGNOSIS — Z7982 Long term (current) use of aspirin: Secondary | ICD-10-CM | POA: Insufficient documentation

## 2017-10-09 DIAGNOSIS — Z7902 Long term (current) use of antithrombotics/antiplatelets: Secondary | ICD-10-CM | POA: Diagnosis not present

## 2017-10-09 DIAGNOSIS — Z794 Long term (current) use of insulin: Secondary | ICD-10-CM | POA: Insufficient documentation

## 2017-10-09 DIAGNOSIS — I255 Ischemic cardiomyopathy: Secondary | ICD-10-CM | POA: Insufficient documentation

## 2017-10-09 DIAGNOSIS — N183 Chronic kidney disease, stage 3 unspecified: Secondary | ICD-10-CM

## 2017-10-09 DIAGNOSIS — Z951 Presence of aortocoronary bypass graft: Secondary | ICD-10-CM | POA: Insufficient documentation

## 2017-10-09 DIAGNOSIS — I2510 Atherosclerotic heart disease of native coronary artery without angina pectoris: Secondary | ICD-10-CM | POA: Diagnosis not present

## 2017-10-09 MED ORDER — SACUBITRIL-VALSARTAN 49-51 MG PO TABS: 1.0000 | ORAL_TABLET | Freq: Two times a day (BID) | ORAL | 3 refills | Status: DC

## 2017-10-09 MED ORDER — FUROSEMIDE 40 MG PO TABS: 60.0000 mg | ORAL_TABLET | Freq: Every day | ORAL | Status: DC

## 2017-10-09 NOTE — Patient Instructions (Signed)
Stop Lisinopril  Start Entresto 49/51 mg Twice daily STARTING FRI 1/25 AM  Can take extra Furosemide (Lasix) as needed  Your physician recommends that you schedule a follow-up appointment in: 1 month
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
## 2017-11-11 ENCOUNTER — Encounter (HOSPITAL_COMMUNITY): Payer: Self-pay

## 2017-11-11 ENCOUNTER — Ambulatory Visit (HOSPITAL_COMMUNITY)
Admission: RE | Admit: 2017-11-11 | Discharge: 2017-11-11 | Disposition: A | Discharge status: Home / Self Care | Payer: Medicare HMO | Source: Ambulatory Visit | Attending: Cardiology | Admitting: Cardiology

## 2017-11-11 VITALS — BP 130/80 | HR 81 | Wt 226.8 lb

## 2017-11-11 DIAGNOSIS — Z794 Long term (current) use of insulin: Secondary | ICD-10-CM | POA: Insufficient documentation

## 2017-11-11 DIAGNOSIS — Z87891 Personal history of nicotine dependence: Secondary | ICD-10-CM | POA: Diagnosis not present

## 2017-11-11 DIAGNOSIS — Z951 Presence of aortocoronary bypass graft: Secondary | ICD-10-CM | POA: Insufficient documentation

## 2017-11-11 DIAGNOSIS — Z79899 Other long term (current) drug therapy: Secondary | ICD-10-CM | POA: Diagnosis not present

## 2017-11-11 DIAGNOSIS — R5383 Other fatigue: Secondary | ICD-10-CM

## 2017-11-11 DIAGNOSIS — I739 Peripheral vascular disease, unspecified: Secondary | ICD-10-CM | POA: Insufficient documentation

## 2017-11-11 DIAGNOSIS — N183 Chronic kidney disease, stage 3 unspecified: Secondary | ICD-10-CM

## 2017-11-11 DIAGNOSIS — I5022 Chronic systolic (congestive) heart failure: Secondary | ICD-10-CM | POA: Diagnosis not present

## 2017-11-11 DIAGNOSIS — I10 Essential (primary) hypertension: Secondary | ICD-10-CM

## 2017-11-11 DIAGNOSIS — I130 Hypertensive heart and chronic kidney disease with heart failure and stage 1 through stage 4 chronic kidney disease, or unspecified chronic kidney disease: Secondary | ICD-10-CM | POA: Insufficient documentation

## 2017-11-11 DIAGNOSIS — E1122 Type 2 diabetes mellitus with diabetic chronic kidney disease: Secondary | ICD-10-CM | POA: Insufficient documentation

## 2017-11-11 DIAGNOSIS — Z7902 Long term (current) use of antithrombotics/antiplatelets: Secondary | ICD-10-CM | POA: Insufficient documentation

## 2017-11-11 DIAGNOSIS — E669 Obesity, unspecified: Secondary | ICD-10-CM | POA: Insufficient documentation

## 2017-11-11 DIAGNOSIS — Z7982 Long term (current) use of aspirin: Secondary | ICD-10-CM | POA: Diagnosis not present

## 2017-11-11 DIAGNOSIS — I255 Ischemic cardiomyopathy: Secondary | ICD-10-CM | POA: Insufficient documentation

## 2017-11-11 DIAGNOSIS — I2510 Atherosclerotic heart disease of native coronary artery without angina pectoris: Secondary | ICD-10-CM | POA: Diagnosis not present

## 2017-11-11 LAB — BASIC METABOLIC PANEL
Anion gap: 10 (ref 5–15)
BUN: 25 mg/dL — ABNORMAL HIGH (ref 6–20)
CHLORIDE: 103 mmol/L (ref 101–111)
CO2: 24 mmol/L (ref 22–32)
Calcium: 9.3 mg/dL (ref 8.9–10.3)
Creatinine, Ser: 1.67 mg/dL — ABNORMAL HIGH (ref 0.61–1.24)
GFR calc Af Amer: 46 mL/min — ABNORMAL LOW (ref >60)
GFR calc non Af Amer: 39 mL/min — ABNORMAL LOW (ref >60)
GLUCOSE: 136 mg/dL — AB (ref 65–99)
Potassium: 4.3 mmol/L (ref 3.5–5.1)
Sodium: 137 mmol/L (ref 135–145)

## 2017-11-11 MED ORDER — SACUBITRIL-VALSARTAN 97-103 MG PO TABS: 1.0000 | ORAL_TABLET | Freq: Two times a day (BID) | ORAL | 3 refills | Status: DC
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
## 2017-11-11 NOTE — Progress Notes (Signed)
Medication Samples have been provided to the patient.  Drug name: Sherryll Burgerntresto      Strength: 49-51 mg    Qty: 4  LOT: [id number] Exp.Date: 01/21  Dosing instructions: Take 2 Tablets Twice Daily  The patient has been instructed regarding the correct time, dose, and frequency of taking this medication, including desired effects and most common side effects.   Georgina PeerFarver, Christophr Calix S 11:39 AM 11/11/2017

## 2017-11-11 NOTE — Patient Instructions (Signed)
Labs today (will call for abnormal results, otherwise no news is good news)  INCREASE Entresto 97-103 mg (1 Tablet) Twice Daily  Sleep Study has been ordered for you.  Follow up in 1 month

## 2017-11-15 ENCOUNTER — Encounter (HOSPITAL_COMMUNITY): Payer: Self-pay

## 2017-11-18 ENCOUNTER — Telehealth (HOSPITAL_COMMUNITY): Payer: Self-pay | Admitting: *Deleted

## 2017-11-18 DIAGNOSIS — I5022 Chronic systolic (congestive) heart failure: Secondary | ICD-10-CM

## 2017-11-18 NOTE — Telephone Encounter (Signed)
Called patient back and again reinforced dietary restrictions and educated on the entresto.  I think he is having a hard time understanding the importance of low sodium diet.  Patient does agree to come in for bmet this Wednesday.  Appt scheduled and bmet orderd.

## 2017-11-18 NOTE — Telephone Encounter (Signed)
Please let him know that Sherryll Burgerntresto has a diuretic in it so should NOT bring his weight up, but would more likely bring it down.    His weight gain is most likely from his marked dietary indiscretion.    Please bring in for BMET this week since we increased his Entresto.     Dennis [name] [address]ennis" St. [name], [place]-C 11/18/2017 3:18 PM

## 2017-11-18 NOTE — Telephone Encounter (Signed)
Advanced Heart Failure Triage Encounter  Patient Name: Dennis ReamsGeorge [name]  Date of Call: 11/18/17  Problem:  Patient called stating he has gained 5 lbs since his last visit with us last week.  His wegiht today is 231 lbs.  He believes this is from increasing his entresto.  He keeps his fluid intake to 2L daily but continues to eat hamburgers, french fries, and hotdogs. He takes 80 mg of lasix daily.  Also complains of feeling tired and weak.    Plan:  I educated the patient on low sodium diet and told him I will forward message to Maxine GlennMichael Tillery, PA to review. If he wants to make more changes besides dietary I will call the patient back.    Georgina PeerFarver, Codie Krogh S, RN

## 2017-11-20 ENCOUNTER — Other Ambulatory Visit (HOSPITAL_COMMUNITY): Payer: Medicare HMO

## 2017-11-21 ENCOUNTER — Encounter (HOSPITAL_BASED_OUTPATIENT_CLINIC_OR_DEPARTMENT_OTHER): Payer: Medicare HMO

## 2017-12-09 ENCOUNTER — Encounter (HOSPITAL_COMMUNITY): Payer: Self-pay

## 2017-12-09 ENCOUNTER — Ambulatory Visit (HOSPITAL_COMMUNITY)
Admission: RE | Admit: 2017-12-09 | Discharge: 2017-12-09 | Disposition: A | Discharge status: Home / Self Care | Payer: Medicare HMO | Source: Ambulatory Visit | Attending: Internal Medicine | Admitting: Internal Medicine

## 2017-12-09 VITALS — BP 126/70 | HR 82 | Wt 234.0 lb

## 2017-12-09 DIAGNOSIS — I5022 Chronic systolic (congestive) heart failure: Secondary | ICD-10-CM | POA: Diagnosis not present

## 2017-12-09 DIAGNOSIS — K219 Gastro-esophageal reflux disease without esophagitis: Secondary | ICD-10-CM | POA: Insufficient documentation

## 2017-12-09 DIAGNOSIS — Z794 Long term (current) use of insulin: Secondary | ICD-10-CM | POA: Diagnosis not present

## 2017-12-09 DIAGNOSIS — N183 Chronic kidney disease, stage 3 unspecified: Secondary | ICD-10-CM

## 2017-12-09 DIAGNOSIS — Z7982 Long term (current) use of aspirin: Secondary | ICD-10-CM | POA: Diagnosis not present

## 2017-12-09 DIAGNOSIS — I255 Ischemic cardiomyopathy: Secondary | ICD-10-CM | POA: Diagnosis not present

## 2017-12-09 DIAGNOSIS — E669 Obesity, unspecified: Secondary | ICD-10-CM | POA: Diagnosis not present

## 2017-12-09 DIAGNOSIS — E785 Hyperlipidemia, unspecified: Secondary | ICD-10-CM | POA: Insufficient documentation

## 2017-12-09 DIAGNOSIS — Z87891 Personal history of nicotine dependence: Secondary | ICD-10-CM | POA: Insufficient documentation

## 2017-12-09 DIAGNOSIS — I10 Essential (primary) hypertension: Secondary | ICD-10-CM

## 2017-12-09 DIAGNOSIS — I130 Hypertensive heart and chronic kidney disease with heart failure and stage 1 through stage 4 chronic kidney disease, or unspecified chronic kidney disease: Secondary | ICD-10-CM | POA: Insufficient documentation

## 2017-12-09 DIAGNOSIS — E1122 Type 2 diabetes mellitus with diabetic chronic kidney disease: Secondary | ICD-10-CM | POA: Diagnosis not present

## 2017-12-09 DIAGNOSIS — I2510 Atherosclerotic heart disease of native coronary artery without angina pectoris: Secondary | ICD-10-CM

## 2017-12-09 DIAGNOSIS — Z7902 Long term (current) use of antithrombotics/antiplatelets: Secondary | ICD-10-CM | POA: Insufficient documentation

## 2017-12-09 DIAGNOSIS — Z79899 Other long term (current) drug therapy: Secondary | ICD-10-CM | POA: Diagnosis not present

## 2017-12-09 DIAGNOSIS — Z6837 Body mass index (BMI) 37.0-37.9, adult: Secondary | ICD-10-CM | POA: Diagnosis not present

## 2017-12-09 DIAGNOSIS — Z951 Presence of aortocoronary bypass graft: Secondary | ICD-10-CM | POA: Diagnosis not present

## 2017-12-09 LAB — BASIC METABOLIC PANEL
Anion gap: 11 (ref 5–15)
BUN: 30 mg/dL — ABNORMAL HIGH (ref 6–20)
CHLORIDE: 104 mmol/L (ref 101–111)
CO2: 20 mmol/L — AB (ref 22–32)
CREATININE: 2.08 mg/dL — AB (ref 0.61–1.24)
Calcium: 9.2 mg/dL (ref 8.9–10.3)
GFR calc non Af Amer: 30 mL/min — ABNORMAL LOW (ref >60)
GFR, EST AFRICAN AMERICAN: 35 mL/min — AB (ref >60)
Glucose, Bld: 261 mg/dL — ABNORMAL HIGH (ref 65–99)
Potassium: 4.1 mmol/L (ref 3.5–5.1)
Sodium: 135 mmol/L (ref 135–145)

## 2017-12-09 LAB — BRAIN NATRIURETIC PEPTIDE: B Natriuretic Peptide: 398.1 pg/mL — ABNORMAL HIGH (ref 0.0–100.0)

## 2017-12-09 NOTE — Patient Instructions (Signed)
Routine lab work today. Will notify you of abnormal results, otherwise no news is good news!  Take Lasix 80 mg TWICE DAILY for TWO DAYS. Then reduce back to normal daily dose of 80 mg ONCE DAILY.  Follow up 6 weeks with Amy Clegg NP-C.  ____________________________________________________________ Dennis RidgeGarage Code:  Take all medication as prescribed the day of your appointment. Bring all medications with you to your appointment.  Do the following things EVERYDAY: 1) Weigh yourself in the morning before breakfast. Write it down and keep it in a log. 2) Take your medicines as prescribed 3) Eat low salt foods-Limit salt (sodium) to 2000 mg per day.  4) Stay as active as you can everyday 5) Limit all fluids for the day to less than 2 liters
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
## 2017-12-10 ENCOUNTER — Telehealth (HOSPITAL_COMMUNITY): Payer: Self-pay | Admitting: Cardiology

## 2017-12-10 DIAGNOSIS — I255 Ischemic cardiomyopathy: Secondary | ICD-10-CM

## 2017-12-10 NOTE — Telephone Encounter (Signed)
-----   Message from Sherald HessAmy D Clegg, NP sent at 12/09/2017 12:55 PM EDT ----- Volume overloaded. Lasix increased today. Repeat BMET in 2 weeks. Needs follow with PCP regarding elevated glucose.

## 2017-12-10 NOTE — Telephone Encounter (Signed)
Notes recorded by Theresia BoughJeffries, Chantel M, CMA on 12/10/2017 at 10:44 AM EDT Patient aware. Repeat labs 4/10   ------  Notes recorded by Theresia BoughJeffries, Chantel M, CMA on 12/09/2017 at 4:50 PM EDT Unable to reach patient.  [phone] ------  Notes recorded by Sherald Hesslegg, Amy D, NP on 12/09/2017 at 12:55 PM EDT Volume overloaded. Lasix increased today. Repeat BMET in 2 weeks. Needs follow with PCP regarding elevated glucose.

## 2017-12-24 ENCOUNTER — Ambulatory Visit: Payer: Medicare HMO | Admitting: Cardiovascular Disease

## 2017-12-24 ENCOUNTER — Encounter: Payer: Self-pay | Admitting: Cardiovascular Disease

## 2017-12-24 ENCOUNTER — Other Ambulatory Visit (HOSPITAL_COMMUNITY): Payer: Self-pay | Admitting: *Deleted

## 2017-12-24 ENCOUNTER — Other Ambulatory Visit: Payer: Self-pay | Admitting: Cardiovascular Disease

## 2017-12-24 ENCOUNTER — Ambulatory Visit (HOSPITAL_COMMUNITY)
Admission: RE | Admit: 2017-12-24 | Discharge: 2017-12-24 | Disposition: A | Discharge status: Home / Self Care | Payer: Medicare HMO | Source: Ambulatory Visit | Attending: Cardiology | Admitting: Cardiology

## 2017-12-24 VITALS — BP 118/76 | HR 98 | Ht 66.5 in | Wt 236.0 lb

## 2017-12-24 DIAGNOSIS — I739 Peripheral vascular disease, unspecified: Secondary | ICD-10-CM

## 2017-12-24 DIAGNOSIS — E7800 Pure hypercholesterolemia, unspecified: Secondary | ICD-10-CM | POA: Diagnosis not present

## 2017-12-24 DIAGNOSIS — Z720 Tobacco use: Secondary | ICD-10-CM | POA: Diagnosis not present

## 2017-12-24 DIAGNOSIS — I255 Ischemic cardiomyopathy: Secondary | ICD-10-CM | POA: Diagnosis present

## 2017-12-24 DIAGNOSIS — I10 Essential (primary) hypertension: Secondary | ICD-10-CM

## 2017-12-24 LAB — BASIC METABOLIC PANEL
ANION GAP: 10 (ref 5–15)
BUN: 33 mg/dL — AB (ref 6–20)
CHLORIDE: 109 mmol/L (ref 101–111)
CO2: 19 mmol/L — ABNORMAL LOW (ref 22–32)
Calcium: 9 mg/dL (ref 8.9–10.3)
Creatinine, Ser: 1.77 mg/dL — ABNORMAL HIGH (ref 0.61–1.24)
GFR calc Af Amer: 42 mL/min — ABNORMAL LOW (ref >60)
GFR, EST NON AFRICAN AMERICAN: 37 mL/min — AB (ref >60)
Glucose, Bld: 203 mg/dL — ABNORMAL HIGH (ref 65–99)
POTASSIUM: 3.9 mmol/L (ref 3.5–5.1)
Sodium: 138 mmol/L (ref 135–145)

## 2017-12-24 MED ORDER — FUROSEMIDE 40 MG PO TABS: 80.0000 mg | ORAL_TABLET | Freq: Every day | ORAL | 3 refills | Status: DC

## 2017-12-24 NOTE — Assessment & Plan Note (Signed)
History of hyperlipidemia on statin therapy followed by his PCP 

## 2017-12-24 NOTE — Assessment & Plan Note (Signed)
History of essential hypertension blood pressure measured 118/76. He is on carvedilol and Entresto

## 2017-12-24 NOTE — Patient Instructions (Addendum)
Medication Instructions:  Your physician recommends that you continue on your current medications as directed. Please refer to the Current Medication list given to you today.   Labwork: none  Testing/Procedures: Your physician has requested that you have a lower extremity arterial doppler- During this test, ultrasound is used to evaluate arterial blood flow in the legs. Allow approximately one hour for this exam.   Your physician has requested that you have an abdominal aorta duplex. During this test, an ultrasound is used to evaluate the aorta. Allow 30 minutes for this exam. Do not eat after midnight the day before and avoid carbonated beverages   Follow-Up: Your physician wants you to follow-up in: 6 months with Dr. Allyson SabalBerry. You will receive a reminder letter in the mail two months in advance. If you don't receive a letter, please call our office to schedule the follow-up appointment.   Any Other Special Instructions Will Be Listed Below (If Applicable).   Low-Sodium Eating Plan Sodium, which is an element that makes up salt, helps you maintain a healthy balance of fluids in your body. Too much sodium can increase your blood pressure and cause fluid and waste to be held in your body. Your health care provider or dietitian may recommend following this plan if you have high blood pressure (hypertension), kidney disease, liver disease, or heart failure. Eating less sodium can help lower your blood pressure, reduce swelling, and protect your heart, liver, and kidneys. What are tips for following this plan? General guidelines  Most people on this plan should limit their sodium intake to 1,500-2,000 mg (milligrams) of sodium each day. Reading food labels  The Nutrition Facts label lists the amount of sodium in one serving of the food. If you eat more than one serving, you must multiply the listed amount of sodium by the number of servings.  Choose foods with less than 140 mg of sodium per  serving.  Avoid foods with 300 mg of sodium or more per serving. Shopping  Look for lower-sodium products, often labeled as "low-sodium" or "no salt added."  Always check the sodium content even if foods are labeled as "unsalted" or "no salt added".  Buy fresh foods. ? Avoid canned foods and premade or frozen meals. ? Avoid canned, cured, or processed meats  Buy breads that have less than 80 mg of sodium per slice. Cooking  Eat more home-cooked food and less restaurant, buffet, and fast food.  Avoid adding salt when cooking. Use salt-free seasonings or herbs instead of table salt or sea salt. Check with your health care provider or pharmacist before using salt substitutes.  Cook with plant-based oils, such as canola, sunflower, or olive oil. Meal planning  When eating at a restaurant, ask that your food be prepared with less salt or no salt, if possible.  Avoid foods that contain MSG (monosodium glutamate). MSG is sometimes added to Cong[place]inese food, bouillon, and some canned foods. What foods are recommended? The items listed may not be a complete list. Talk with your dietitian about what dietary choices are best for you. Grains Low-sodium cereals, including oats, puffed wheat and rice, and shredded wheat. Low-sodium crackers. Unsalted rice. Unsalted pasta. Low-sodium bread. Whole-grain breads and whole-grain pasta. Vegetables Fresh or frozen vegetables. "No salt added" canned vegetables. "No salt added" tomato sauce and paste. Low-sodium or reduced-sodium tomato and vegetable juice. Fruits Fresh, frozen, or canned fruit. Fruit juice. Meats and other protein foods Fresh or frozen (no salt added) meat, poultry, seafood, and fish.  Low-sodium canned tuna and salmon. Unsalted nuts. Dried peas, beans, and lentils without added salt. Unsalted canned beans. Eggs. Unsalted nut butters. Dairy Milk. Soy milk. Cheese that is naturally low in sodium, such as ricotta cheese, fresh mozzarella, or  Swiss cheese Low-sodium or reduced-sodium cheese. Cream cheese. Yogurt. Fats and oils Unsalted butter. Unsalted margarine with no trans fat. Vegetable oils such as canola or olive oils. Seasonings and other foods Fresh and dried herbs and spices. Salt-free seasonings. Low-sodium mustard and ketchup. Sodium-free salad dressing. Sodium-free light mayonnaise. Fresh or refrigerated horseradish. Lemon juice. Vinegar. Homemade, reduced-sodium, or low-sodium soups. Unsalted popcorn and pretzels. Low-salt or salt-free chips. What foods are not recommended? The items listed may not be a complete list. Talk with your dietitian about what dietary choices are best for you. Grains Instant hot cereals. Bread stuffing, pancake, and biscuit mixes. Croutons. Seasoned rice or pasta mixes. Noodle soup cups. Boxed or frozen macaroni and cheese. Regular salted crackers. Self-rising flour. Vegetables Sauerkraut, pickled vegetables, and relishes. Olives. [place] fries. Onion rings. Regular canned vegetables (not low-sodium or reduced-sodium). Regular canned tomato sauce and paste (not low-sodium or reduced-sodium). Regular tomato and vegetable juice (not low-sodium or reduced-sodium). Frozen vegetables in sauces. Meats and other protein foods Meat or fish that is salted, canned, smoked, spiced, or pickled. Bacon, ham, sausage, hotdogs, corned beef, chipped beef, packaged lunch meats, salt pork, jerky, pickled herring, anchovies, regular canned tuna, sardines, salted nuts. Dairy Processed cheese and cheese spreads. Cheese curds. Blue cheese. Feta cheese. String cheese. Regular cottage cheese. Buttermilk. Canned milk. Fats and oils Salted butter. Regular margarine. Ghee. Bacon fat. Seasonings and other foods Onion salt, garlic salt, seasoned salt, table salt, and sea salt. Canned and packaged gravies. Worcestershire sauce. Tartar sauce. Barbecue sauce. Teriyaki sauce. Soy sauce, including reduced-sodium. Steak sauce. Fish  sauce. Oyster sauce. Cocktail sauce. Horseradish that you find on the shelf. Regular ketchup and mustard. Meat flavorings and tenderizers. Bouillon cubes. Hot sauce and Tabasco sauce. Premade or packaged marinades. Premade or packaged taco seasonings. Relishes. Regular salad dressings. Salsa. Potato and tortilla chips. Corn chips and puffs. Salted popcorn and pretzels. Canned or dried soups. Pizza. Frozen entrees and pot pies. Summary  Eating less sodium can help lower your blood pressure, reduce swelling, and protect your heart, liver, and kidneys.  Most people on this plan should limit their sodium intake to 1,500-2,000 mg (milligrams) of sodium each day.  Canned, boxed, and frozen foods are high in sodium. Restaurant foods, fast foods, and pizza are also very high in sodium. You also get sodium by adding salt to food.  Try to cook at home, eat more fresh fruits and vegetables, and eat less fast food, canned, processed, or prepared foods. This information is not intended to replace advice given to you by your health care provider. Make sure you discuss any questions you have with your health care provider. Document Released: 02/23/2002 Document Revised: 08/27/2016 Document Reviewed: 08/27/2016 Elsevier Interactive Patient Education  [employer].    If you need a refill on your cardiac medications before your next appointment, please call your pharmacy.

## 2017-12-24 NOTE — Assessment & Plan Note (Signed)
discontinued

## 2017-12-24 NOTE — Progress Notes (Signed)
12/24/2017 [name]   1946/05/23  [id number]  Primary Physician System, Provider Not In Primary Cardiologist: [name] MD [name], [place], [place]  HPI:  Dennis [name] is a 72 y.o.   father of one, who last saw him in the office 10/04/17. He was referred by [facility] at [facility] for evaluation of claudication and arterial Doppler studies which were obtained in our office 04/07/13. His cardiovascular risk factors include type 2 diabetes, hypertension, and hyperlipidemia. He has a 50-100-pack-year history of tobacco abuse currently smoking one pack to 2 packs a day. There is no family history of heart disease. He's never had a heart attack or stroke. He does complain of dyspnea on exertion. He has had claudication for the last 2 years worse over the 3 months Prior to initially seen me which was now lifestyle limiting. Doppler studies in our office performed 04/07/13 revealed a right ABI of 0.63 and a left ABI of 0.70. He had high-grade SFA disease bilaterally as well as tibial disease.because of a positive Myoview stress test the patient first had a diagnostic coronary arteriogram. Which showed 3 vessel disease with moderate LV dysfunction. He'll underwent coronary bypass grafting x4 by Dr. [name] on 05/12/13 with a sequential LIMA to the LAD and diagonal branch, vein to obtuse marginal branch and to the PDA. His postop course was uncomplicated. Following his coronary artery bypass graft surgery he underwent staged bilateral superficial femoral artery directional atherectomy with marked improvement in his claudication and arterial Doppler studies. This week he developed shortness of breath and was seen in an emergency room and diagnosed with congestive heart failure. He was treated with Lasix and referred back to [place] for further evaluation. He saw [name] in our office yesterday who did a 2-D echocardiogram today that revealed an EF of 20-25%. This is a finding now  almost 3 months status post coronary revascularization. He wore a LifeVest for 3 months because of a ejection fraction of 20-25% with global hypokinesia. A Myoview stress test showed inferior scar. [name] underwent cardiac catheterization by myself 08/25/13 with a high-grade stenosis within his PDA graft was stented. His ejection fraction improved to 40-45% negating the need for ICD therapy. . Bilateral lower extremity pain similar to this preintervention symptoms. Followup Dopplers confirmed recurrent disease.on 11/16/13 he underwent re\re angiography, TurboHAWK directional arthrectomy, PTA of distal right SFA with drug-eluting balloon. He had a excellent angiographic and clinical result. He had residual disease in the left left lower extremity lifestyle limiting claudication.on 12/07/13 he underwent directional atherectomy using block of his proximal left SFA. He does have residual 60% segmental disease in the mid and distal left SFA with one vessel runoff via the perineal. followup Doppler studies performed 12/11/13 revealed a right ABI 0.96 and a left ABI of 0.83. Since I saw him 6 months ago he's done relatively well has been stable from a heart point of view.  I performed bilateral SFA directional atherectomy with drug elutingballoon angioplastyresulting in improvement in his symptoms of claudication and his Doppler studies. His last Doppler performed in4/30/18 revealed normal ABIs and widely patent SFA. He has been briefly hospitalized because of hypotension for unclear reasons. His PCP has down titrated his carvedilol from 25 mg by mouth twice a day down to 6.25 mg by mouth twice a day. His most recent echo performed at [facility] 10/03/16 revealed an EF of 35-40%. Since I saw him in the  office 6 months ago he's had several admissions for your visits over the last month for increasing dyspnea. He was told to double his carvedilol and his furosemide. Today the office is polys  41. He now has class III symptoms at least but does not have peripheral edema. I'm worried that his EF has worsened and his renal failure has worsened as well. I referred him to Dr. [name] at the advanced heart failure clinic for optimization of his heart failure from oncology. [name] was begun. Carvedilol was down titrated by his PCP because of relative hypotension. He has gained 6 pounds since I saw him back in January. His abdomen is protuberant. He does admit to dietary indiscretion with regards to salt. He also complains of bilateral calf claudication which is new.     Current Meds  Medication Sig  . acetaminophen (TYLENOL) 325 MG tablet Take 2 tablets (650 mg total) by mouth every 4 (four) hours as needed for headache or mild pain.  Marland Kitchen aspirin 81 MG tablet Take 81 mg by mouth daily.  Marland Kitchen atorvastatin (LIPITOR) 80 MG tablet Take 1 tablet (80 mg total) by mouth daily.  . carvedilol (COREG) 6.25 MG tablet Take 1 tablet (6.25 mg total) by mouth 2 (two) times daily with a meal. (Patient taking differently: Take 3.125 mg by mouth 2 (two) times daily with a meal. )  . cetirizine (ZYRTEC) 10 MG tablet Take 10 mg by mouth daily as needed.   . ferrous sulfate 325 (65 FE) MG EC tablet Take 325 mg by mouth 3 (three) times daily with meals.  . furosemide (LASIX) 40 MG tablet Take 2 tablets (80 mg total) by mouth daily.  . insulin glargine (LANTUS) 100 UNIT/ML injection Inject 50 Units into the skin at bedtime.   Marland Kitchen levothyroxine (SYNTHROID, LEVOTHROID) 25 MCG tablet Take 25 mcg by mouth daily before breakfast.  . magnesium oxide (MAGOX 400) 400 (241.3 MG) MG tablet Take 400 mg by mouth daily.  . niacin (NIASPAN) 500 MG CR tablet Take 1 tablet (500 mg total) by mouth at bedtime.  . pantoprazole (PROTONIX) 40 MG tablet Take 40 mg by mouth 2 (two) times daily before a meal.  . ranitidine (ZANTAC) 150 MG tablet Take 150 mg by mouth 2 (two) times daily.  . sacubitril-valsartan (ENTRESTO) 49-51 MG Take 1  tablet by mouth 2 (two) times daily.     Allergies  Allergen Reactions  . Other Other (See Comments)    Seasonal  Nasal congestion   . Chlorhexidine Rash    Social History   Socioeconomic History  . Marital status: Married    Spouse name: Not on file  . Number of children: Not on file  . Years of education: Not on file  . Highest education level: Not on file  Occupational History  . Not on file  Social Needs  . Financial resource strain: Not on file  . Food insecurity:    Worry: Not on file    Inability: Not on file  . Transportation needs:    Medical: Not on file    Non-medical: Not on file  Tobacco Use  . Smoking status: Former Smoker    Packs/day: 1.00    Years: 55.00    Pack years: 55.00    Types: Cigarettes    Last attempt to quit: 05/05/2013    Years since quitting: 4.6  . Smokeless tobacco: Never Used  . Tobacco comment: 12/07/2013 now using E- Cig.  Substance and Sexual Activity  .  Alcohol use: No    Comment: 12/07/2013 "I'll have a beer once in a blue moon"  . Drug use: No  . Sexual activity: Not Currently  Lifestyle  . Physical activity:    Days per week: Not on file    Minutes per session: Not on file  . Stress: Not on file  Relationships  . Social connections:    Talks on phone: Not on file    Gets together: Not on file    Attends religious service: Not on file    Active member of club or organization: Not on file    Attends meetings of clubs or organizations: Not on file    Relationship status: Not on file  . Intimate partner violence:    Fear of current or ex partner: Not on file    Emotionally abused: Not on file    Physically abused: Not on file    Forced sexual activity: Not on file  Other Topics Concern  . Not on file  Social History Narrative  . Not on file     Review of Systems: General: negative for chills, fever, night sweats or weight changes.  Cardiovascular: negative for chest pain, dyspnea on exertion, edema, orthopnea,  palpitations, paroxysmal nocturnal dyspnea or shortness of breath Dermatological: negative for rash Respiratory: negative for cough or wheezing Urologic: negative for hematuria Abdominal: negative for nausea, vomiting, diarrhea, bright red blood per rectum, melena, or hematemesis Neurologic: negative for visual changes, syncope, or dizziness All other systems reviewed and are otherwise negative except as noted above.    Blood pressure 118/76, pulse 98, height 5' 6.5" (1.689 m), weight 236 lb (107 kg).  General appearance: alert and no distress Neck: no adenopathy, no carotid bruit, no JVD, supple, symmetrical, trachea midline and thyroid not enlarged, symmetric, no tenderness/mass/nodules Lungs: clear to auscultation bilaterally Heart: regular rate and rhythm, S1, S2 normal, no murmur, click, rub or gallop Extremities: extremities normal, atraumatic, no cyanosis or edema Pulses: 2+ and symmetric Skin: Skin color, texture, turgor normal. No rashes or lesions Neurologic: Alert and oriented X 3, normal strength and tone. Normal symmetric reflexes. Normal coordination and gait  EKG not performed today  ASSESSMENT AND PLAN:   Essential hypertension History of essential hypertension blood pressure measured 118/76. He is on carvedilol and Entresto   Hyperlipidemia History of hyperlipidemia on statin therapy followed by his PCP.  Tobacco abuse discontinued  PVD - bilat SFA disease - s/p multiple PTA History of PAD status post staged bilateral SFA directional atherectomy with marked improvement in his claudication. He underwent repeat intervention because of recurrent disease 11/16/13 of his distal right SFA using atherectomy and drug-eluting balloon angioplasty. He did have residual disease in the left and this was performed on 12/07/13. His most recent Dopplers performed 01/14/17 revealed normal iliac arteries, right ABI 0.89 and left ABI 0.79 with what appeared to be patent SFA.He does now  complain of bilateral calf claudication which is lifestyle limiting. I'm going to repeat lower extremity arterial Doppler studies.  Hx of CABG History of CAD status post bypass grafting 4 by Dr. Melrose NakayamaBrian Bartel 05/12/13 with a sequential LIMA to the LAD and diagonal branch, vein to obtuse marginal branch and PDA. Postoperative course was uncomplicated. He underwent repeat cardiac catheterization by myself after an abnormal Myoview stress test 08/21/13 revealed a high-grade stenosis within the PDA graft which I stented. His EF was in the 40-45% range. He currently denies chest pain but does have class III heart failure.  Ischemic cardiomyopathy History of ischemic cardiomyopathy with ejection fraction of 35-40% by most recent 2-D echo 09/25/17. I did refer him to Dr. Sampson Goon for optimization of his heart failure pharmacology. [name] was begun which she is tolerating at the current dose.      [name] MD FACP,FACC,FAHA, [facility] 12/24/2017 10:18 AM

## 2017-12-24 NOTE — Assessment & Plan Note (Signed)
History of ischemic cardiomyopathy with ejection fraction of 35-40% by most recent 2-D echo 09/25/17. I did refer him to Dr. Sampson GoonBen Simon for optimization of his heart failure pharmacology. Sherryll Burgerntresto was begun which she is tolerating at the current dose.

## 2017-12-24 NOTE — Assessment & Plan Note (Signed)
History of PAD status post staged bilateral SFA directional atherectomy with marked improvement in his claudication. He underwent repeat intervention because of recurrent disease 11/16/13 of his distal right SFA using atherectomy and drug-eluting balloon angioplasty. He did have residual disease in the left and this was performed on 12/07/13. His most recent Dopplers performed 01/14/17 revealed normal iliac arteries, right ABI 0.89 and left ABI 0.79 with what appeared to be patent SFA.He does now complain of bilateral calf claudication which is lifestyle limiting. I'm going to repeat lower extremity arterial Doppler studies.

## 2017-12-24 NOTE — Assessment & Plan Note (Signed)
History of CAD status post bypass grafting 4 by Dr. Melrose NakayamaBrian Bartel 05/12/13 with a sequential LIMA to the LAD and diagonal branch, vein to obtuse marginal branch and PDA. Postoperative course was uncomplicated. He underwent repeat cardiac catheterization by myself after an abnormal Myoview stress test 08/21/13 revealed a high-grade stenosis within the PDA graft which I stented. His EF was in the 40-45% range. He currently denies chest pain but does have class III heart failure.

## 2017-12-25 ENCOUNTER — Other Ambulatory Visit (HOSPITAL_COMMUNITY): Payer: Medicare HMO

## 2018-01-03 ENCOUNTER — Ambulatory Visit (HOSPITAL_COMMUNITY)
Admission: RE | Admit: 2018-01-03 | Discharge: 2018-01-03 | Disposition: A | Discharge status: Home / Self Care | Payer: Medicare HMO | Source: Ambulatory Visit | Attending: Cardiology | Admitting: Cardiology

## 2018-01-03 DIAGNOSIS — Z95828 Presence of other vascular implants and grafts: Secondary | ICD-10-CM | POA: Insufficient documentation

## 2018-01-03 DIAGNOSIS — E119 Type 2 diabetes mellitus without complications: Secondary | ICD-10-CM | POA: Diagnosis not present

## 2018-01-03 DIAGNOSIS — I739 Peripheral vascular disease, unspecified: Secondary | ICD-10-CM | POA: Insufficient documentation

## 2018-01-03 DIAGNOSIS — E785 Hyperlipidemia, unspecified: Secondary | ICD-10-CM | POA: Diagnosis not present

## 2018-01-03 DIAGNOSIS — Z87891 Personal history of nicotine dependence: Secondary | ICD-10-CM | POA: Insufficient documentation

## 2018-01-03 DIAGNOSIS — I10 Essential (primary) hypertension: Secondary | ICD-10-CM | POA: Diagnosis not present

## 2018-01-06 ENCOUNTER — Other Ambulatory Visit: Payer: Self-pay | Admitting: Cardiovascular Disease

## 2018-01-06 DIAGNOSIS — I739 Peripheral vascular disease, unspecified: Secondary | ICD-10-CM

## 2018-01-17 DIAGNOSIS — M519 Unspecified thoracic, thoracolumbar and lumbosacral intervertebral disc disorder: Secondary | ICD-10-CM | POA: Insufficient documentation

## 2018-01-17 DIAGNOSIS — M9979 Connective tissue and disc stenosis of intervertebral foramina of abdomen and other regions: Secondary | ICD-10-CM | POA: Insufficient documentation

## 2018-01-17 DIAGNOSIS — M545 Low back pain, unspecified: Secondary | ICD-10-CM | POA: Insufficient documentation

## 2018-01-17 DIAGNOSIS — M9989 Other biomechanical lesions of abdomen and other regions: Secondary | ICD-10-CM

## 2018-01-17 DIAGNOSIS — G8929 Other chronic pain: Secondary | ICD-10-CM | POA: Insufficient documentation

## 2018-01-20 ENCOUNTER — Encounter (HOSPITAL_COMMUNITY): Payer: Medicare HMO

## 2018-01-24 ENCOUNTER — Ambulatory Visit: Payer: Medicare HMO | Admitting: Cardiovascular Disease

## 2018-01-24 ENCOUNTER — Encounter: Payer: Self-pay | Admitting: Cardiovascular Disease

## 2018-01-24 ENCOUNTER — Telehealth (HOSPITAL_COMMUNITY): Payer: Self-pay | Admitting: *Deleted

## 2018-01-24 ENCOUNTER — Ambulatory Visit (HOSPITAL_COMMUNITY)
Admission: RE | Admit: 2018-01-24 | Discharge: 2018-01-24 | Disposition: A | Discharge status: Home / Self Care | Payer: Medicare HMO | Source: Ambulatory Visit | Attending: Internal Medicine | Admitting: Internal Medicine

## 2018-01-24 ENCOUNTER — Encounter (HOSPITAL_COMMUNITY): Payer: Self-pay

## 2018-01-24 VITALS — BP 123/71 | HR 101 | Ht 69.5 in | Wt 231.6 lb

## 2018-01-24 VITALS — BP 126/78 | HR 110 | Wt 230.6 lb

## 2018-01-24 DIAGNOSIS — Z7982 Long term (current) use of aspirin: Secondary | ICD-10-CM | POA: Diagnosis not present

## 2018-01-24 DIAGNOSIS — I739 Peripheral vascular disease, unspecified: Secondary | ICD-10-CM

## 2018-01-24 DIAGNOSIS — I5022 Chronic systolic (congestive) heart failure: Secondary | ICD-10-CM

## 2018-01-24 DIAGNOSIS — E1151 Type 2 diabetes mellitus with diabetic peripheral angiopathy without gangrene: Secondary | ICD-10-CM | POA: Diagnosis not present

## 2018-01-24 DIAGNOSIS — Z951 Presence of aortocoronary bypass graft: Secondary | ICD-10-CM | POA: Insufficient documentation

## 2018-01-24 DIAGNOSIS — Z87891 Personal history of nicotine dependence: Secondary | ICD-10-CM | POA: Diagnosis not present

## 2018-01-24 DIAGNOSIS — I2510 Atherosclerotic heart disease of native coronary artery without angina pectoris: Secondary | ICD-10-CM | POA: Diagnosis not present

## 2018-01-24 DIAGNOSIS — E7800 Pure hypercholesterolemia, unspecified: Secondary | ICD-10-CM

## 2018-01-24 DIAGNOSIS — Z7902 Long term (current) use of antithrombotics/antiplatelets: Secondary | ICD-10-CM | POA: Insufficient documentation

## 2018-01-24 DIAGNOSIS — I10 Essential (primary) hypertension: Secondary | ICD-10-CM

## 2018-01-24 DIAGNOSIS — I255 Ischemic cardiomyopathy: Secondary | ICD-10-CM

## 2018-01-24 DIAGNOSIS — Z794 Long term (current) use of insulin: Secondary | ICD-10-CM | POA: Diagnosis not present

## 2018-01-24 DIAGNOSIS — I25810 Atherosclerosis of coronary artery bypass graft(s) without angina pectoris: Secondary | ICD-10-CM

## 2018-01-24 DIAGNOSIS — Z79899 Other long term (current) drug therapy: Secondary | ICD-10-CM | POA: Insufficient documentation

## 2018-01-24 DIAGNOSIS — N183 Chronic kidney disease, stage 3 unspecified: Secondary | ICD-10-CM

## 2018-01-24 DIAGNOSIS — I25700 Atherosclerosis of coronary artery bypass graft(s), unspecified, with unstable angina pectoris: Secondary | ICD-10-CM | POA: Diagnosis not present

## 2018-01-24 DIAGNOSIS — Z79890 Hormone replacement therapy (postmenopausal): Secondary | ICD-10-CM | POA: Diagnosis not present

## 2018-01-24 DIAGNOSIS — I130 Hypertensive heart and chronic kidney disease with heart failure and stage 1 through stage 4 chronic kidney disease, or unspecified chronic kidney disease: Secondary | ICD-10-CM | POA: Insufficient documentation

## 2018-01-24 DIAGNOSIS — E785 Hyperlipidemia, unspecified: Secondary | ICD-10-CM | POA: Insufficient documentation

## 2018-01-24 DIAGNOSIS — K219 Gastro-esophageal reflux disease without esophagitis: Secondary | ICD-10-CM | POA: Diagnosis not present

## 2018-01-24 DIAGNOSIS — E1122 Type 2 diabetes mellitus with diabetic chronic kidney disease: Secondary | ICD-10-CM | POA: Insufficient documentation

## 2018-01-24 LAB — BASIC METABOLIC PANEL
Anion gap: 12 (ref 5–15)
BUN: 33 mg/dL — AB (ref 6–20)
CALCIUM: 9 mg/dL (ref 8.9–10.3)
CO2: 24 mmol/L (ref 22–32)
Chloride: 102 mmol/L (ref 101–111)
Creatinine, Ser: 2.64 mg/dL — ABNORMAL HIGH (ref 0.61–1.24)
GFR calc Af Amer: 26 mL/min — ABNORMAL LOW (ref >60)
GFR, EST NON AFRICAN AMERICAN: 23 mL/min — AB (ref >60)
Glucose, Bld: 304 mg/dL — ABNORMAL HIGH (ref 65–99)
POTASSIUM: 3.4 mmol/L — AB (ref 3.5–5.1)
Sodium: 138 mmol/L (ref 135–145)

## 2018-01-24 MED ORDER — EPLERENONE 25 MG PO TABS: 12.5000 mg | ORAL_TABLET | Freq: Every day | ORAL | 3 refills | Status: DC

## 2018-01-24 NOTE — Patient Instructions (Signed)
Medication Instructions: Your physician recommends that you continue on your current medications as directed. Please refer to the Current Medication list given to you today.   Testing/Procedures:  In 6 months: Your physician has requested that you have a lower extremity arterial duplex. During this test, ultrasound is used to evaluate arterial blood flow in the legs. Allow one hour for this exam. There are no restrictions or special instructions.  Your physician has requested that you have an ankle brachial index (ABI). During this test an ultrasound and blood pressure cuff are used to evaluate the arteries that supply the arms and legs with blood. Allow thirty minutes for this exam. There are no restrictions or special instructions.  Follow-Up: Your physician wants you to follow-up in: 6 months with Dr. [name]. You will receive a reminder letter in the mail two months in advance. If you don't receive a letter, please call our office to schedule the follow-up appointment.  If you need a refill on your cardiac medications before your next appointment, please call your pharmacy.

## 2018-01-24 NOTE — Assessment & Plan Note (Signed)
History of hyperlipidemia on statin therapy. 

## 2018-01-24 NOTE — Telephone Encounter (Signed)
Result Notes for Basic metabolic panel   Notes recorded by Georgina Peer, RN on 01/24/2018 at 2:58 PM EDT Patient called regarding labs and he is agreeable with plan. He will hold lasix Saturday, Sunday, and Monday and will not start eplerenone. Lab appointment scheduled Tuesday and bmet ordered. MAR updated. ------  Notes recorded by Alford Highland, NP on 01/24/2018 at 2:34 PM EDT Creatinine is elevated. Hold lasix x 3 days since he was vomiting earlier this week. Do not start eplerenone that was ordered for today. Recheck BMET Tuesday or Wednesday.

## 2018-01-24 NOTE — Assessment & Plan Note (Signed)
History of essential hypertension her blood pressure measured at 123/71.  He is on carvedilol and Entresto 49/51.

## 2018-01-24 NOTE — Assessment & Plan Note (Signed)
History of ischemic heart myopathy with an EF of 35 to 40%.  He is on carvedilol and Entresto.  Inspra was started today.  Serum creatinine has risen to 2.64 from 1.77.

## 2018-01-24 NOTE — Patient Instructions (Signed)
START Inspra 12.5 mg (1/2 tablet) once daily.  Routine lab work today. Will notify you of abnormal results, otherwise no news is good news!  Return in 1-2 weeks for labs.  Follow up 6 weeks with Dennis Maxin NP-C.  Take all medication as prescribed the day of your appointment. Bring all medications with you to your appointment.  Do the following things EVERYDAY: 1) Weigh yourself in the morning before breakfast. Write it down and keep it in a log. 2) Take your medicines as prescribed 3) Eat low salt foods-Limit salt (sodium) to 2000 mg per day.  4) Stay as active as you can everyday 5) Limit all fluids for the day to less than 2 liters

## 2018-01-24 NOTE — Progress Notes (Signed)
01/24/2018 Dennis [name]   [birth year]  [id number]  Primary Physician System, Pcp Not In Primary Cardiologist: [name] MD [name], [place], [place]  HPI:  Dennis [name] is a 72 y.o.  father of one,wholast saw him in the 12/24/2017. He was referred by [facility] at [facility] for evaluation of claudication and arterial Doppler studies which were obtained in our office 04/07/13. His cardiovascular risk factors include type 2 diabetes, hypertension, and hyperlipidemia. He has a 50-100-pack-year history of tobacco abuse currently smoking one pack to 2 packs a day. There is no family history of heart disease. He's never had a heart attack or stroke. He does complain of dyspnea on exertion. He has had claudication for the last 2 years worse over the 3 months Prior to initially seen me which was now lifestyle limiting. Doppler studies in our office performed 04/07/13 revealed a right ABI of 0.63 and a left ABI of 0.70. He had high-grade SFA disease bilaterally as well as tibial disease.because of a positive Myoview stress test the patient first had a diagnostic coronary arteriogram. Which showed 3 vessel disease with moderate LV dysfunction. He'll underwent coronary bypass grafting x4 by Dr. [name] on 05/12/13 with a sequential LIMA to the LAD and diagonal branch, vein to obtuse marginal branch and to the PDA. His postop course was uncomplicated. Following his coronary artery bypass graft surgery he underwent staged bilateral superficial femoral artery directional atherectomy with marked improvement in his claudication and arterial Doppler studies. This week he developed shortness of breath and was seen in an emergency room and diagnosed with congestive heart failure. He was treated with Lasix and referred back to [place] for further evaluation. He saw [name] in our office yesterday who did a 2-D echocardiogram today that revealed an EF of 20-25%. This is a finding now almost 3 months  status post coronary revascularization. He wore a LifeVest for 3 months because of a ejection fraction of 20-25% with global hypokinesia. A Myoview stress test showed inferior scar. [name] underwent cardiac catheterization by myself 08/25/13 with a high-grade stenosis within his PDA graft was stented. His ejection fraction improved to 40-45% negating the need for ICD therapy. . Bilateral lower extremity pain similar to this preintervention symptoms. Followup Dopplers confirmed recurrent disease.on 11/16/13 he underwent re\re angiography, TurboHAWK directional arthrectomy, PTA of distal right SFA with drug-eluting balloon. He had a excellent angiographic and clinical result. He had residual disease in the left left lower extremity lifestyle limiting claudication.on 12/07/13 he underwent directional atherectomy using block of his proximal left SFA. He does have residual 60% segmental disease in the mid and distal left SFA with one vessel runoff via the perineal. followup Doppler studies performed 12/11/13 revealed a right ABI 0.96 and a left ABI of 0.83. Since I saw him 6 months ago he's done relatively well has been stable from a heart point of view.  I performed bilateral SFA directional atherectomy with drug elutingballoon angioplastyresulting in improvement in his symptoms of claudication and his Doppler studies. His last Doppler performed in4/30/18 revealed normal ABIs and widely patent SFA. He has been briefly hospitalized because of hypotension for unclear reasons. His PCP has down titrated his carvedilol from 25 mg by mouth twice a day down to 6.25 mg by mouth twice a day. His most recent echo performed at [facility] 10/03/16 revealed an EF of 35-40%. Since I saw him in the office 6 months ago  he's had several admissions for your visits over the last month for increasing dyspnea. He was told to double his carvedilol and his furosemide. Today the office is polys 41. He now has  class III symptoms at least but does not have peripheral edema. I'm worried that his EF has worsened and his renal failure has worsened as well. I referred him to Dr. [name] at the advanced heart failure clinic for optimization of his heart failure from oncology. [name] was begun. Carvedilol was down titrated by his PCP because of relative hypotension. He has gained 6 pounds since I saw him back in January. His abdomen is protuberant. He does admit to dietary indiscretion with regards to salt. He also complains of bilateral calf claudication which is new. His most recent Doppler showed decline in his right ABI with a new high-frequency signal in his mid left SFA although his serum creatinine is elevated 2.64 making angiography not feasible at this time.      Current Meds  Medication Sig  . acetaminophen (TYLENOL) 325 MG tablet Take 2 tablets (650 mg total) by mouth every 4 (four) hours as needed for headache or mild pain.  Marland Kitchen aspirin 81 MG tablet Take 81 mg by mouth daily.  Marland Kitchen atorvastatin (LIPITOR) 80 MG tablet Take 1 tablet (80 mg total) by mouth daily.  . carvedilol (COREG) 6.25 MG tablet Take 3.125 mg by mouth 2 (two) times daily with a meal.  . cetirizine (ZYRTEC) 10 MG tablet Take 10 mg by mouth daily as needed.   Marland Kitchen eplerenone (INSPRA) 25 MG tablet Take 0.5 tablets (12.5 mg total) by mouth daily.  . ferrous sulfate 325 (65 FE) MG EC tablet Take 325 mg by mouth 3 (three) times daily with meals.  . furosemide (LASIX) 40 MG tablet Take 2 tablets (80 mg total) by mouth daily.  . insulin glargine (LANTUS) 100 UNIT/ML injection Inject 50 Units into the skin at bedtime.   Marland Kitchen levothyroxine (SYNTHROID, LEVOTHROID) 25 MCG tablet Take 25 mcg by mouth daily before breakfast.  . magnesium oxide (MAGOX 400) 400 (241.3 MG) MG tablet Take 400 mg by mouth daily.  . niacin (NIASPAN) 500 MG CR tablet Take 1 tablet (500 mg total) by mouth at bedtime.  . pantoprazole (PROTONIX) 40 MG tablet Take 40 mg by  mouth 2 (two) times daily before a meal.  . ranitidine (ZANTAC) 150 MG tablet Take 150 mg by mouth 2 (two) times daily.  . sacubitril-valsartan (ENTRESTO) 49-51 MG Take 1 tablet by mouth 2 (two) times daily.     Allergies  Allergen Reactions  . Other Other (See Comments)    Seasonal  Nasal congestion   . Chlorhexidine Rash    Social History   Socioeconomic History  . Marital status: Married    Spouse name: Not on file  . Number of children: Not on file  . Years of education: Not on file  . Highest education level: Not on file  Occupational History  . Not on file  Social Needs  . Financial resource strain: Not on file  . Food insecurity:    Worry: Not on file    Inability: Not on file  . Transportation needs:    Medical: Not on file    Non-medical: Not on file  Tobacco Use  . Smoking status: Former Smoker    Packs/day: 1.00    Years: 55.00    Pack years: 55.00    Types: Cigarettes    Last attempt to quit: 05/05/2013  Years since quitting: 4.7  . Smokeless tobacco: Never Used  . Tobacco comment: 12/07/2013 now using E- Cig.  Substance and Sexual Activity  . Alcohol use: No    Comment: 12/07/2013 "I'll have a beer once in a blue moon"  . Drug use: No  . Sexual activity: Not Currently  Lifestyle  . Physical activity:    Days per week: Not on file    Minutes per session: Not on file  . Stress: Not on file  Relationships  . Social connections:    Talks on phone: Not on file    Gets together: Not on file    Attends religious service: Not on file    Active member of club or organization: Not on file    Attends meetings of clubs or organizations: Not on file    Relationship status: Not on file  . Intimate partner violence:    Fear of current or ex partner: Not on file    Emotionally abused: Not on file    Physically abused: Not on file    Forced sexual activity: Not on file  Other Topics Concern  . Not on file  Social History Narrative  . Not on file      Review of Systems: General: negative for chills, fever, night sweats or weight changes.  Cardiovascular: negative for chest pain, dyspnea on exertion, edema, orthopnea, palpitations, paroxysmal nocturnal dyspnea or shortness of breath Dermatological: negative for rash Respiratory: negative for cough or wheezing Urologic: negative for hematuria Abdominal: negative for nausea, vomiting, diarrhea, bright red blood per rectum, melena, or hematemesis Neurologic: negative for visual changes, syncope, or dizziness All other systems reviewed and are otherwise negative except as noted above.    Blood pressure 123/71, pulse (!) 101, height 5' 9.5" (1.765 m), weight 231 lb 9.6 oz (105.1 kg).  General appearance: alert and no distress Neck: no adenopathy, no carotid bruit, no JVD, supple, symmetrical, trachea midline and thyroid not enlarged, symmetric, no tenderness/mass/nodules Lungs: clear to auscultation bilaterally Heart: regular rate and rhythm, S1, S2 normal, no murmur, click, rub or gallop Extremities: extremities normal, atraumatic, no cyanosis or edema Pulses: 2+ and symmetric Skin: Skin color, texture, turgor normal. No rashes or lesions Neurologic: Alert and oriented X 3, normal strength and tone. Normal symmetric reflexes. Normal coordination and gait  EKG not performed today  ASSESSMENT AND PLAN:   Essential hypertension History of essential hypertension her blood pressure measured at 123/71.  He is on carvedilol and Entresto 49/51.  Hyperlipidemia History of hyperlipidemia on statin therapy  PVD - bilat SFA disease - s/p multiple PTA History of peripheral artery disease status post bilateral SFA directional atherectomy 12/07/2013 with marked improvement in his Dopplers and symptoms.  He has had recurrent symptoms with recent Dopplers that showed decline in his left ABI with a new high-frequency signal in his mid left SFA/19/19 although his serum creatinine is elevated to 2.64  today making angiography an issue with regards to radiocontrast nephropathy.  CAD of artery bypass graft- SVG-PDA DES 08/25/13 History of CAD status post coronary artery bypass grafting x4 by Dr. [name] 05/12/2013 with a sequential LIMA to the LAD and diagonal branch, vein to the obtuse marginal branch and PDA.  He denies chest pain.  A Myoview stress test that showed inferior scar without ischemia.  Ischemic cardiomyopathy History of ischemic heart myopathy with an EF of 35 to 40%.  He is on carvedilol and Entresto.  Inspra was started today.  Serum creatinine  has risen to 2.64 from 1.77.      [name] MD [place],  [facility] 01/24/2018 2:39 PM

## 2018-01-24 NOTE — Assessment & Plan Note (Signed)
History of peripheral artery disease status post bilateral SFA directional atherectomy 12/07/2013 with marked improvement in his Dopplers and symptoms.  He has had recurrent symptoms with recent Dopplers that showed decline in his left ABI with a new high-frequency signal in his mid left SFA/19/19 although his serum creatinine is elevated to 2.64 today making angiography an issue with regards to radiocontrast nephropathy.

## 2018-01-24 NOTE — Assessment & Plan Note (Addendum)
History of CAD status post coronary artery bypass grafting x4 by Dr. [name] 05/12/2013 with a sequential LIMA to the LAD and diagonal branch, vein to the obtuse marginal branch and PDA.  He denies chest pain.  A Myoview stress test that showed inferior scar without ischemia.
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
## 2018-01-28 ENCOUNTER — Telehealth (HOSPITAL_COMMUNITY): Payer: Self-pay | Admitting: *Deleted

## 2018-01-28 ENCOUNTER — Ambulatory Visit (HOSPITAL_COMMUNITY)
Admission: RE | Admit: 2018-01-28 | Discharge: 2018-01-28 | Disposition: A | Discharge status: Home / Self Care | Payer: Medicare HMO | Source: Ambulatory Visit | Attending: Cardiology | Admitting: Cardiology

## 2018-01-28 DIAGNOSIS — I5022 Chronic systolic (congestive) heart failure: Secondary | ICD-10-CM | POA: Diagnosis not present

## 2018-01-28 LAB — BASIC METABOLIC PANEL
Anion gap: 12 (ref 5–15)
BUN: 22 mg/dL — AB (ref 6–20)
CHLORIDE: 101 mmol/L (ref 101–111)
CO2: 24 mmol/L (ref 22–32)
CREATININE: 1.74 mg/dL — AB (ref 0.61–1.24)
Calcium: 9.1 mg/dL (ref 8.9–10.3)
GFR calc Af Amer: 43 mL/min — ABNORMAL LOW (ref >60)
GFR calc non Af Amer: 37 mL/min — ABNORMAL LOW (ref >60)
Glucose, Bld: 238 mg/dL — ABNORMAL HIGH (ref 65–99)
Potassium: 3.3 mmol/L — ABNORMAL LOW (ref 3.5–5.1)
SODIUM: 137 mmol/L (ref 135–145)

## 2018-01-28 MED ORDER — POTASSIUM CHLORIDE ER 20 MEQ PO TBCR: 40.0000 meq | EXTENDED_RELEASE_TABLET | Freq: Every day | ORAL | 3 refills | Status: DC

## 2018-01-28 NOTE — Telephone Encounter (Signed)
Result Notes for Basic metabolic panel   Notes recorded by Georgina Peer, RN on 01/28/2018 at 11:53 AM EDT Called and spoke with patient he is agreeable with plan. Potassium added and sent to preferred pharmacy. ------  Notes recorded by Alford Highland, NP on 01/28/2018 at 10:47 AM EDT Creatinine is back to baseline. Okay to resume lasix 80 mg daily. Start 40 meq potassium daily. Check BMET next week (already scheduled).

## 2018-01-30 DIAGNOSIS — M47816 Spondylosis without myelopathy or radiculopathy, lumbar region: Secondary | ICD-10-CM | POA: Insufficient documentation

## 2018-02-04 ENCOUNTER — Ambulatory Visit (HOSPITAL_COMMUNITY)
Admission: RE | Admit: 2018-02-04 | Discharge: 2018-02-04 | Disposition: A | Discharge status: Home / Self Care | Payer: Medicare HMO | Source: Ambulatory Visit | Attending: Cardiology | Admitting: Cardiology

## 2018-02-04 DIAGNOSIS — I255 Ischemic cardiomyopathy: Secondary | ICD-10-CM | POA: Insufficient documentation

## 2018-02-04 LAB — BASIC METABOLIC PANEL
Anion gap: 10 (ref 5–15)
BUN: 23 mg/dL — AB (ref 6–20)
CALCIUM: 8.9 mg/dL (ref 8.9–10.3)
CO2: 22 mmol/L (ref 22–32)
CREATININE: 1.86 mg/dL — AB (ref 0.61–1.24)
Chloride: 107 mmol/L (ref 101–111)
GFR calc non Af Amer: 35 mL/min — ABNORMAL LOW (ref >60)
GFR, EST AFRICAN AMERICAN: 40 mL/min — AB (ref >60)
GLUCOSE: 260 mg/dL — AB (ref 65–99)
Potassium: 5.1 mmol/L (ref 3.5–5.1)
Sodium: 139 mmol/L (ref 135–145)

## 2018-02-05 ENCOUNTER — Telehealth (HOSPITAL_COMMUNITY): Payer: Self-pay | Admitting: Cardiology

## 2018-02-05 MED ORDER — POTASSIUM CHLORIDE ER 20 MEQ PO TBCR: 20.0000 meq | EXTENDED_RELEASE_TABLET | Freq: Every day | ORAL | 3 refills | Status: DC

## 2018-02-05 NOTE — Telephone Encounter (Signed)
-----   Message from Alford Highland, NP sent at 02/04/2018 12:33 PM EDT ----- Creatinine relatively stable. Please have him decrease potassium to 20 meq daily.

## 2018-02-12 ENCOUNTER — Encounter: Payer: Self-pay | Admitting: Cardiovascular Disease

## 2018-02-13 ENCOUNTER — Telehealth: Payer: Self-pay | Admitting: *Deleted

## 2018-02-13 NOTE — Telephone Encounter (Signed)
Unable to reach pt or leave a message mailbox is full 

## 2018-02-13 NOTE — Telephone Encounter (Signed)
-----   Message from Evans Lance sent at 02/13/2018  9:56 AM EDT ----- Regarding: FW: hospital followup   ----- Message ----- From: Sharin Mons I Sent: 02/12/2018  10:08 AM To: Evans Lance Subject: hospital followup                                Dennis [name]  This patient sent an email for a followup with Dr. [name] as he is currently in [facility] for a heart attack.  He said the stent that Dr. [name] put in in 2015 is 90% clogged.  He would like a phone call please.  I have him scheduled to see Dr. [name] on 02-26-18 at 3:30.  Thanks [employer]

## 2018-02-14 NOTE — Telephone Encounter (Signed)
Follow up scheduled

## 2018-02-26 ENCOUNTER — Ambulatory Visit: Payer: Medicare HMO | Admitting: Cardiovascular Disease

## 2018-02-26 ENCOUNTER — Encounter: Payer: Self-pay | Admitting: Cardiovascular Disease

## 2018-02-26 VITALS — BP 101/56 | HR 55 | Ht 69.0 in | Wt 236.0 lb

## 2018-02-26 DIAGNOSIS — R0989 Other specified symptoms and signs involving the circulatory and respiratory systems: Secondary | ICD-10-CM

## 2018-02-26 DIAGNOSIS — I739 Peripheral vascular disease, unspecified: Secondary | ICD-10-CM | POA: Diagnosis not present

## 2018-02-26 NOTE — Assessment & Plan Note (Signed)
History of ischemic cardiomyopathy with an EF in the 40% range on appropriate medications.

## 2018-02-26 NOTE — Assessment & Plan Note (Signed)
History of hyperlipidemia on statin therapy. 

## 2018-02-26 NOTE — Assessment & Plan Note (Signed)
History of PAD bilateral SFA intervention in the past.  He does complain of left calf claudication with recent Dopplers that show a high-frequency signal in his mid left SFA with a decline in his left ABI down to 0.74.  He does have moderate renal insufficiency with a serum creatinine of 1.86.  He wishes to proceed with angiography and endovascular therapy.  He does have one-vessel runoff via peroneal.

## 2018-02-26 NOTE — Assessment & Plan Note (Signed)
History of CAD status post coronary artery bypass grafting times 48/26/14.  I performed cardiac catheterization on him 08/25/2013 and stented his PDA vein graft.  [facility] and underwent cardiac catheterization revealing no lesions that warranted PCI.  The sequential LIMA to the LAD and first diagonal branch was patent as was the vein graft to the second obtuse marginal branch although the vein to the RCA was occluded at its origin.  His native vessels were all occluded as well.  His EF by 2D echo was 40 to 45%.  He has had no recurrent chest pain.

## 2018-02-26 NOTE — Patient Instructions (Addendum)
   [facility] [address] [place] [place] [address] Dept: [phone] Loc: [phone]  [name]  02/26/2018  You are scheduled for a Peripheral Angiogram on Monday, June 24 with Dr. Nanetta BattyJonathan Berry.  1. [facility] at [facility]: [address] [place], [place] [address] at 5:30 AM (two hours before your procedure to ensure your preparation). Free valet parking service is available.   Special note: Every effort is made to have your procedure done on time. Please understand that emergencies sometimes delay scheduled procedures.  2. Diet: Do not eat or drink anything after midnight prior to your procedure except sips of water to take medications.  3. Labs: Please have blood work done within 14 days of procedure.  4. Medication instructions in preparation for your procedure:  HOLD Furosemide the morning of your procedure.  Take only 25 units of insulin the night before your procedure. Do not take any insulin on the day of the procedure. Hold any other diabetic medications the morning of your procedure.  On the morning of your procedure, take your Aspirin and any morning medicines NOT listed above.  You may use sips of water.  5. Plan for one night stay--bring personal belongings. 6. Bring a current list of your medications and current insurance cards. 7. You MUST have a responsible person to drive you home. 8. Someone MUST be with you the first 24 hours after you arrive home or your discharge will be delayed. 9. Please wear clothes that are easy to get on and off and wear slip-on shoes.  Thank you for allowing us to care for you!   -- [facility]    Other testing: Please schedule a Carotid doppler to be done within the next week.

## 2018-02-26 NOTE — Progress Notes (Signed)
02/26/2018 Dennis [name]   04/24/1946  [id number]  Primary Physician System, Pcp Not In Primary Cardiologist: Runell GessJonathan J Yu Peggs MD Milagros LollFACP, FACC, [place], [place]  HPI:  Dennis [name] is a 72 y.o.  father of one,wholast saw him in the  01/24/2018. [facility] for evaluation of claudication and arterial Doppler studies which were obtained in our office 04/07/13. His cardiovascular risk factors include type 2 diabetes, hypertension, and hyperlipidemia. He has a 50-100-pack-year history of tobacco abuse currently smoking one pack to 2 packs a day. There is no family history of heart disease. He's never had a heart attack or stroke. He does complain of dyspnea on exertion. He has had claudication for the last 2 years worse over the 3 months Prior to initially seen me which was now lifestyle limiting. Doppler studies in our office performed 04/07/13 revealed a right ABI of 0.63 and a left ABI of 0.70. He had high-grade SFA disease bilaterally as well as tibial disease.because of a positive Myoview stress test the patient first had a diagnostic coronary arteriogram. Which showed 3 vessel disease with moderate LV dysfunction. He'll underwent coronary bypass grafting x4 by Dr. Rexanne ManoBrian Bartle on 05/12/13 with a sequential LIMA to the LAD and diagonal branch, vein to obtuse marginal branch and to the PDA. His postop course was uncomplicated. Following his coronary artery bypass graft surgery he underwent staged bilateral superficial femoral artery directional atherectomy with marked improvement in his claudication and arterial Doppler studies. This week he developed shortness of breath and was seen in an emergency room and diagnosed with congestive heart failure. He was treated with Lasix and referred back to us for further evaluation. He saw [name] PA-C in our office yesterday who did a 2-D echocardiogram today that revealed an EF of 20-25%. This is a finding now almost 3  months status post coronary revascularization. He wore a LifeVest for 3 months because of a ejection fraction of 20-25% with global hypokinesia. A Myoview stress test showed inferior scar. [name] underwent cardiac catheterization by myself 08/25/13 with a high-grade stenosis within his PDA graft was stented. His ejection fraction improved to 40-45% negating the need for ICD therapy. . Bilateral lower extremity pain similar to this preintervention symptoms. Followup Dopplers confirmed recurrent disease.on 11/16/13 he underwent re\re angiography, TurboHAWK directional arthrectomy, PTA of distal right SFA with drug-eluting balloon. He had a excellent angiographic and clinical result. He had residual disease in the left left lower extremity lifestyle limiting claudication.on 12/07/13 he underwent directional atherectomy using block of his proximal left SFA. He does have residual 60% segmental disease in the mid and distal left SFA with one vessel runoff via the perineal. followup Doppler studies performed 12/11/13 revealed a right ABI 0.96 and a left ABI of 0.83. Since I saw him 6 months ago he's done relatively well has been stable from a heart point of view.  I performed bilateral SFA directional atherectomy with drug elutingballoon angioplastyresulting in improvement in his symptoms of claudication and his Doppler studies. His last Doppler performed in4/30/18 revealed normal ABIs and widely patent SFA. He has been briefly hospitalized because of hypotension for unclear reasons. His PCP has down titrated his carvedilol from 25 mg by mouth twice a day down to 6.25 mg by mouth twice a day. [facility] revealed an EF of 35-40%. Since I saw him in the office 6 months  ago he's had several admissions for your visits over the last month for increasing dyspnea. He was told to double his carvedilol and his furosemide. Today the office is polys 41. He now  has class III symptoms at least but does not have peripheral edema. I'm worried that his EF has worsened and his renal failure has worsened as well. I referred him to Dr. [name] at the advanced heart failure clinic for optimization of his heart failure from oncology. [name] was begun. Carvedilol was down titrated by his PCP because of relative hypotension. He has gained 6 pounds since I saw him back in January. His abdomen is protuberant. He does admit to dietary indiscretion with regards to salt. He also complains of bilateral calf claudication which is new. His most recent Doppler showed decline in his right ABI with a new high-frequency signal in his mid left SFA with a decline in his left ABI to 0.74.  His serum creatinine is 1.86.  He does wish to proceed with peripheral angiography and endovascular therapy for lifestyle limiting claudication. He was recently admitted to [facility] with chest pain.  He underwent cardiac catheterization by Dr. Onalee Hua revealing no lesions that warranted PCI.  His sequential LIMA to the LAD and diet was patent as was his vein graft to OM 2 although his vein to his RCA was occluded and appeared chronic.     Current Meds  Medication Sig  . acetaminophen (TYLENOL) 325 MG tablet Take 2 tablets (650 mg total) by mouth every 4 (four) hours as needed for headache or mild pain.  Marland Kitchen aspirin 81 MG tablet Take 81 mg by mouth daily.  Marland Kitchen atorvastatin (LIPITOR) 80 MG tablet Take 1 tablet (80 mg total) by mouth daily.  . carvedilol (COREG) 12.5 MG tablet Take by mouth.  . cetirizine (ZYRTEC) 10 MG tablet Take 10 mg by mouth daily as needed.   . ferrous sulfate 325 (65 FE) MG EC tablet Take 325 mg by mouth 3 (three) times daily with meals.  . furosemide (LASIX) 40 MG tablet Take 2 tablets (80 mg total) by mouth daily.  . insulin glargine (LANTUS) 100 UNIT/ML injection Inject 50 Units into the skin at bedtime.   . isosorbide mononitrate (IMDUR) 30 MG  24 hr tablet Take by mouth.  . levothyroxine (SYNTHROID, LEVOTHROID) 25 MCG tablet Take 25 mcg by mouth daily before breakfast.  . magnesium oxide (MAGOX 400) 400 (241.3 MG) MG tablet Take 400 mg by mouth daily.  . niacin (NIASPAN) 500 MG CR tablet Take 1 tablet (500 mg total) by mouth at bedtime.  . nitroGLYCERIN (NITROSTAT) 0.4 MG SL tablet Place under the tongue.  . pantoprazole (PROTONIX) 40 MG tablet Take 40 mg by mouth 2 (two) times daily before a meal.  . potassium chloride (K-DUR,KLOR-CON) 10 MEQ tablet Take by mouth.  . ranitidine (ZANTAC) 150 MG tablet Take 150 mg by mouth 2 (two) times daily.  . sacubitril-valsartan (ENTRESTO) 49-51 MG Take 1 tablet by mouth 2 (two) times daily.     Allergies  Allergen Reactions  . Other Other (See Comments)    Seasonal  Nasal congestion   . Chlorhexidine Rash    Social History   Socioeconomic History  . Marital status: Married    Spouse name: Not on file  . Number of children: Not on file  . Years of education: Not on file  . Highest education level: Not on file  Occupational History  . Not on file  Social Needs  . Financial resource strain: Not on file  . Food insecurity:    Worry: Not on file    Inability: Not on file  . Transportation needs:    Medical: Not on file    Non-medical: Not on file  Tobacco Use  . Smoking status: Former Smoker    Packs/day: 1.00    Years: 55.00    Pack years: 55.00    Types: Cigarettes    Last attempt to quit: 05/05/2013    Years since quitting: 4.8  . Smokeless tobacco: Never Used  . Tobacco comment: 12/07/2013 now using E- Cig.  Substance and Sexual Activity  . Alcohol use: No    Comment: 12/07/2013 "I'll have a beer once in a blue moon"  . Drug use: No  . Sexual activity: Not Currently  Lifestyle  . Physical activity:    Days per week: Not on file    Minutes per session: Not on file  . Stress: Not on file  Relationships  . Social connections:    Talks on phone: Not on file    Gets  together: Not on file    Attends religious service: Not on file    Active member of club or organization: Not on file    Attends meetings of clubs or organizations: Not on file    Relationship status: Not on file  . Intimate partner violence:    Fear of current or ex partner: Not on file    Emotionally abused: Not on file    Physically abused: Not on file    Forced sexual activity: Not on file  Other Topics Concern  . Not on file  Social History Narrative  . Not on file     Review of Systems: General: negative for chills, fever, night sweats or weight changes.  Cardiovascular: negative for chest pain, dyspnea on exertion, edema, orthopnea, palpitations, paroxysmal nocturnal dyspnea or shortness of breath Dermatological: negative for rash Respiratory: negative for cough or wheezing Urologic: negative for hematuria Abdominal: negative for nausea, vomiting, diarrhea, bright red blood per rectum, melena, or hematemesis Neurologic: negative for visual changes, syncope, or dizziness All other systems reviewed and are otherwise negative except as noted above.    Blood pressure (!) 101/56, pulse (!) 55, height 5\' 9"  (1.753 m), weight 236 lb (107 kg).  General appearance: alert and no distress Neck: no adenopathy, no JVD, supple, symmetrical, trachea midline, thyroid not enlarged, symmetric, no tenderness/mass/nodules and Soft bilateral carotid bruits Lungs: clear to auscultation bilaterally Heart: regular rate and rhythm, S1, S2 normal, no murmur, click, rub or gallop Extremities: extremities normal, atraumatic, no cyanosis or edema Pulses: Diminished pedal pulses bilaterally Skin: Skin color, texture, turgor normal. No rashes or lesions Neurologic: Alert and oriented X 3, normal strength and tone. Normal symmetric reflexes. Normal coordination and gait  EKG not performed today  ASSESSMENT AND PLAN:   Essential hypertension History of essential hypertension blood pressure measured  today at 101/56.  He is on Entresto and carvedilol..  Continue current meds at current dosing.  Hyperlipidemia History of hyperlipidemia on statin therapy  PVD - bilat SFA disease - s/p multiple PTA History of PAD bilateral SFA intervention in the past.  He does complain of left calf claudication with recent Dopplers that show a high-frequency signal in his mid left SFA with a decline in his left ABI down to 0.74.  He does have moderate renal insufficiency with a serum creatinine of 1.86.  He wishes to proceed  with angiography and endovascular therapy.  He does have one-vessel runoff via peroneal.  Hx of CABG History of CAD status post coronary artery bypass grafting times 48/26/14.  I performed cardiac catheterization on him 08/25/2013 and stented his PDA vein graft.  He recently was admitted to [facility] with chest pain and underwent cardiac catheterization revealing no lesions that warranted PCI.  The sequential LIMA to the LAD and first diagonal branch was patent as was the vein graft to the second obtuse marginal branch although the vein to the RCA was occluded at its origin.  His native vessels were all occluded as well.  His EF by 2D echo was 40 to 45%.  He has had no recurrent chest pain.  Ischemic cardiomyopathy History of ischemic cardiomyopathy with an EF in the 40% range on appropriate medications.      [name] MD FACP,FACC,FAHA, [facility] 02/26/2018 3:57 PM

## 2018-02-26 NOTE — Assessment & Plan Note (Signed)
History of essential hypertension blood pressure measured today at 101/56.  He is on Entresto and carvedilol..  Continue current meds at current dosing.

## 2018-03-04 ENCOUNTER — Ambulatory Visit (HOSPITAL_COMMUNITY)
Admission: RE | Admit: 2018-03-04 | Discharge: 2018-03-04 | Disposition: A | Discharge status: Home / Self Care | Payer: Medicare HMO | Source: Ambulatory Visit | Attending: Cardiovascular Disease | Admitting: Cardiovascular Disease

## 2018-03-04 DIAGNOSIS — R0989 Other specified symptoms and signs involving the circulatory and respiratory systems: Secondary | ICD-10-CM

## 2018-03-04 DIAGNOSIS — I6523 Occlusion and stenosis of bilateral carotid arteries: Secondary | ICD-10-CM | POA: Insufficient documentation

## 2018-03-05 ENCOUNTER — Other Ambulatory Visit: Payer: Self-pay | Admitting: *Deleted

## 2018-03-05 ENCOUNTER — Telehealth: Payer: Self-pay | Admitting: Cardiovascular Disease

## 2018-03-05 DIAGNOSIS — E875 Hyperkalemia: Secondary | ICD-10-CM

## 2018-03-05 DIAGNOSIS — I255 Ischemic cardiomyopathy: Secondary | ICD-10-CM

## 2018-03-05 LAB — BASIC METABOLIC PANEL
BUN / CREAT RATIO: 19 (ref 10–24)
BUN: 36 mg/dL — AB (ref 8–27)
CHLORIDE: 103 mmol/L (ref 96–106)
CO2: 19 mmol/L — ABNORMAL LOW (ref 20–29)
CREATININE: 1.88 mg/dL — AB (ref 0.76–1.27)
Calcium: 9.3 mg/dL (ref 8.6–10.2)
GFR calc non Af Amer: 35 mL/min/{1.73_m2} — ABNORMAL LOW (ref >59)
GFR, EST AFRICAN AMERICAN: 40 mL/min/{1.73_m2} — AB (ref >59)
Glucose: 349 mg/dL — ABNORMAL HIGH (ref 65–99)
Potassium: 6.6 mmol/L (ref 3.5–5.2)
Sodium: 137 mmol/L (ref 134–144)

## 2018-03-05 LAB — CBC
HEMATOCRIT: 36.3 % — AB (ref 37.5–51.0)
Hemoglobin: 11.5 g/dL — ABNORMAL LOW (ref 13.0–17.7)
MCH: 27.4 pg (ref 26.6–33.0)
MCHC: 31.7 g/dL (ref 31.5–35.7)
MCV: 87 fL (ref 79–97)
Platelets: 211 10*3/uL (ref 150–450)
RBC: 4.19 x10E6/uL (ref 4.14–5.80)
RDW: 14.5 % (ref 12.3–15.4)
WBC: 7.6 10*3/uL (ref 3.4–10.8)

## 2018-03-05 LAB — APTT: aPTT: 28 s (ref 24–33)

## 2018-03-05 LAB — PROTIME-INR
INR: 1 (ref 0.8–1.2)
Prothrombin Time: 10.4 s (ref 9.1–12.0)

## 2018-03-05 NOTE — Telephone Encounter (Signed)
Attempted to reach pt--left detailed vm (ok per DPR).  Pt will need to come to hospital early for Pre-Hydration per Dr. Allyson SabalBerry (message sent to Katina DungAnne Lankford to arrange).   Also per Dr. Allyson SabalBerry, pt will need to hold Lasix and Entresto for 2 days prior to procedure scheduled for 03/10/18 and needs to come in today for repeat blood work due to elevated potassium.   Details given in VM and asked for patient to call back to discuss.   Order placed for repeat BMET.

## 2018-03-05 NOTE — Telephone Encounter (Signed)
New message:        Pt is calling and states he is in [facility] parking lot to get labs done and they have not received the order for it.

## 2018-03-05 NOTE — Telephone Encounter (Signed)
Spoke to patient, he states [facility] did not have order.  He states he has left and will get it done tomorrow.   Strongly encouraged to get this done today, advised I could fax order to them if unable to see it in the system.    He states [place].    Patient also asking about message left in regards to hydration and medications pre procedure.   Advised that he will need pre hydration before his procedure on Monday and per phone note procedure nurse is to arrange.   Also advised to hold Lasix and Entresto 2 days prior to procedure.   Patient aware.

## 2018-03-05 NOTE — Telephone Encounter (Signed)
Hayley, RN spoke to pt about repeating blood work and placed a STAT BMET order. [facility] [place] [facility] [place] this.

## 2018-03-06 ENCOUNTER — Ambulatory Visit (HOSPITAL_COMMUNITY)
Admission: RE | Admit: 2018-03-06 | Discharge: 2018-03-06 | Disposition: A | Discharge status: Home / Self Care | Payer: Medicare HMO | Source: Ambulatory Visit | Attending: Cardiology | Admitting: Cardiology

## 2018-03-06 ENCOUNTER — Telehealth: Payer: Self-pay | Admitting: *Deleted

## 2018-03-06 ENCOUNTER — Encounter (HOSPITAL_COMMUNITY): Payer: Self-pay

## 2018-03-06 VITALS — BP 162/68 | HR 63 | Wt 240.0 lb

## 2018-03-06 DIAGNOSIS — E785 Hyperlipidemia, unspecified: Secondary | ICD-10-CM | POA: Diagnosis not present

## 2018-03-06 DIAGNOSIS — I2510 Atherosclerotic heart disease of native coronary artery without angina pectoris: Secondary | ICD-10-CM | POA: Insufficient documentation

## 2018-03-06 DIAGNOSIS — Z7902 Long term (current) use of antithrombotics/antiplatelets: Secondary | ICD-10-CM | POA: Insufficient documentation

## 2018-03-06 DIAGNOSIS — I739 Peripheral vascular disease, unspecified: Secondary | ICD-10-CM | POA: Diagnosis not present

## 2018-03-06 DIAGNOSIS — E1151 Type 2 diabetes mellitus with diabetic peripheral angiopathy without gangrene: Secondary | ICD-10-CM | POA: Insufficient documentation

## 2018-03-06 DIAGNOSIS — I255 Ischemic cardiomyopathy: Secondary | ICD-10-CM | POA: Diagnosis not present

## 2018-03-06 DIAGNOSIS — Z79899 Other long term (current) drug therapy: Secondary | ICD-10-CM | POA: Insufficient documentation

## 2018-03-06 DIAGNOSIS — Z794 Long term (current) use of insulin: Secondary | ICD-10-CM | POA: Diagnosis not present

## 2018-03-06 DIAGNOSIS — R5383 Other fatigue: Secondary | ICD-10-CM | POA: Insufficient documentation

## 2018-03-06 DIAGNOSIS — E875 Hyperkalemia: Secondary | ICD-10-CM | POA: Diagnosis not present

## 2018-03-06 DIAGNOSIS — Z951 Presence of aortocoronary bypass graft: Secondary | ICD-10-CM | POA: Diagnosis not present

## 2018-03-06 DIAGNOSIS — I130 Hypertensive heart and chronic kidney disease with heart failure and stage 1 through stage 4 chronic kidney disease, or unspecified chronic kidney disease: Secondary | ICD-10-CM | POA: Diagnosis not present

## 2018-03-06 DIAGNOSIS — K219 Gastro-esophageal reflux disease without esophagitis: Secondary | ICD-10-CM | POA: Diagnosis not present

## 2018-03-06 DIAGNOSIS — N183 Chronic kidney disease, stage 3 unspecified: Secondary | ICD-10-CM

## 2018-03-06 DIAGNOSIS — E669 Obesity, unspecified: Secondary | ICD-10-CM | POA: Insufficient documentation

## 2018-03-06 DIAGNOSIS — Z7982 Long term (current) use of aspirin: Secondary | ICD-10-CM | POA: Diagnosis not present

## 2018-03-06 DIAGNOSIS — Z79890 Hormone replacement therapy (postmenopausal): Secondary | ICD-10-CM | POA: Insufficient documentation

## 2018-03-06 DIAGNOSIS — E1122 Type 2 diabetes mellitus with diabetic chronic kidney disease: Secondary | ICD-10-CM | POA: Insufficient documentation

## 2018-03-06 DIAGNOSIS — Z87891 Personal history of nicotine dependence: Secondary | ICD-10-CM | POA: Diagnosis not present

## 2018-03-06 DIAGNOSIS — I5022 Chronic systolic (congestive) heart failure: Secondary | ICD-10-CM | POA: Diagnosis present

## 2018-03-06 LAB — BASIC METABOLIC PANEL
Anion gap: 7 (ref 5–15)
BUN / CREAT RATIO: 18 (ref 10–24)
BUN: 37 mg/dL — ABNORMAL HIGH (ref 8–27)
BUN: 36 mg/dL — AB (ref 6–20)
CHLORIDE: 109 mmol/L (ref 101–111)
CO2: 20 mmol/L (ref 20–29)
CO2: 22 mmol/L (ref 22–32)
CREATININE: 2.03 mg/dL — AB (ref 0.76–1.27)
CREATININE: 2.15 mg/dL — AB (ref 0.61–1.24)
Calcium: 9.4 mg/dL (ref 8.6–10.2)
Calcium: 8.9 mg/dL (ref 8.9–10.3)
Chloride: 105 mmol/L (ref 96–106)
GFR calc Af Amer: 34 mL/min — ABNORMAL LOW (ref >60)
GFR calc non Af Amer: 29 mL/min — ABNORMAL LOW (ref >60)
GFR, EST AFRICAN AMERICAN: 37 mL/min/{1.73_m2} — AB (ref >59)
GFR, EST NON AFRICAN AMERICAN: 32 mL/min/{1.73_m2} — AB (ref >59)
GLUCOSE: 197 mg/dL — AB (ref 65–99)
GLUCOSE: 292 mg/dL — AB (ref 65–99)
Potassium: 5.6 mmol/L — ABNORMAL HIGH (ref 3.5–5.2)
Potassium: 5.2 mmol/L — ABNORMAL HIGH (ref 3.5–5.1)
SODIUM: 138 mmol/L (ref 134–144)
SODIUM: 138 mmol/L (ref 135–145)

## 2018-03-06 NOTE — Progress Notes (Signed)
Because of the patient's elevated creatinine, he will be admitted the day before for prehydration.  We will hold his diuretic and Entresto as well.  Runell GessJonathan J. Berry, M.D., FACP, [facility], Earl LagosFAHA, [facility] [address] [place], [place]  [id number]  [phone] 03/06/2018 8:36 AM

## 2018-03-06 NOTE — Telephone Encounter (Signed)
Pt contacted pre-lower extremity intervention scheduled [facility] for: Monday June 24,2019 7:30 AM Ar[facility] on Sunday March 09, 2018 for pre-procedure hydration per Dr Allyson SabalBerry.   I spoke with Gregary SignsSean at [facility] patient placement, they will call patient when a bed is ready on Sunday June 23,2019. Pt is aware to expect a call from [facility] on Sunday March 09, 2018 for pre-procedure hydration and that he will be there overnight for hydration.  Pt [facility] that [facility] for repeat lab. Pt is aware to hold Entresto and Furosemide on Sunday March 09, 2018.   [facility]ied from [facility] 03/04/18: Notes recorded by Runell GessBerry, Jonathan J, MD on 03/06/2018 at 8:27 AM EDT Labs need to be repeated today. Patient needs to be admitted on Sunday, the day before his procedure for hydration. His diuretics and Entresto need to be placed on hold at that time.
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
## 2018-03-06 NOTE — H&P (View-Only) (Signed)
Because of the patient's elevated creatinine, he will be admitted the day before for prehydration.  We will hold his diuretic and Entresto as well.  Dennis [name] J. Kloe Oates, M.D., FACP, FACC, FAHA, FSCAI [facility] [address] [place], [place]  [address]  [phone] 03/06/2018 8:36 AM  

## 2018-03-06 NOTE — Patient Instructions (Addendum)
STOP Potassium.  Double up on Lasix (Furosemide) for two days -- Take 80 mg (2 tabs) once daily for two days. Then STOP.  Follow up 6 weeks.  __________________________________________________________________________ [place] Ridge[place]age Code:  Take all medication as prescribed the day of your appointment. Bring all medications with you to your appointment.  Do the following things EVERYDAY: 1) Weigh yourself in the morning before breakfast. Write it down and keep it in a log. 2) Take your medicines as prescribed 3) Eat low salt foods-Limit salt (sodium) to 2000 mg per day.  4) Stay as active as you can everyday 5) Limit all fluids for the day to less than 2 liters

## 2018-03-09 ENCOUNTER — Inpatient Hospital Stay (HOSPITAL_COMMUNITY)
Admission: RE | Admit: 2018-03-09 | Discharge: 2018-03-13 | DRG: 253 | Disposition: A | Discharge status: Home / Self Care | Payer: Medicare HMO | Source: Ambulatory Visit | Attending: Cardiovascular Disease | Admitting: Cardiovascular Disease

## 2018-03-09 ENCOUNTER — Encounter (HOSPITAL_COMMUNITY): Payer: Self-pay | Admitting: Cardiology

## 2018-03-09 ENCOUNTER — Other Ambulatory Visit: Payer: Self-pay

## 2018-03-09 DIAGNOSIS — I739 Peripheral vascular disease, unspecified: Secondary | ICD-10-CM

## 2018-03-09 DIAGNOSIS — I724 Aneurysm of artery of lower extremity: Secondary | ICD-10-CM | POA: Diagnosis not present

## 2018-03-09 DIAGNOSIS — E1122 Type 2 diabetes mellitus with diabetic chronic kidney disease: Secondary | ICD-10-CM | POA: Diagnosis present

## 2018-03-09 DIAGNOSIS — R001 Bradycardia, unspecified: Secondary | ICD-10-CM | POA: Diagnosis not present

## 2018-03-09 DIAGNOSIS — N183 Chronic kidney disease, stage 3 unspecified: Secondary | ICD-10-CM | POA: Diagnosis present

## 2018-03-09 DIAGNOSIS — N184 Chronic kidney disease, stage 4 (severe): Secondary | ICD-10-CM | POA: Diagnosis not present

## 2018-03-09 DIAGNOSIS — Z79899 Other long term (current) drug therapy: Secondary | ICD-10-CM

## 2018-03-09 DIAGNOSIS — E785 Hyperlipidemia, unspecified: Secondary | ICD-10-CM | POA: Diagnosis present

## 2018-03-09 DIAGNOSIS — Z720 Tobacco use: Secondary | ICD-10-CM | POA: Diagnosis present

## 2018-03-09 DIAGNOSIS — K219 Gastro-esophageal reflux disease without esophagitis: Secondary | ICD-10-CM | POA: Diagnosis present

## 2018-03-09 DIAGNOSIS — I130 Hypertensive heart and chronic kidney disease with heart failure and stage 1 through stage 4 chronic kidney disease, or unspecified chronic kidney disease: Secondary | ICD-10-CM | POA: Diagnosis present

## 2018-03-09 DIAGNOSIS — Z7902 Long term (current) use of antithrombotics/antiplatelets: Secondary | ICD-10-CM

## 2018-03-09 DIAGNOSIS — I2510 Atherosclerotic heart disease of native coronary artery without angina pectoris: Secondary | ICD-10-CM | POA: Diagnosis present

## 2018-03-09 DIAGNOSIS — I5022 Chronic systolic (congestive) heart failure: Secondary | ICD-10-CM | POA: Diagnosis present

## 2018-03-09 DIAGNOSIS — E669 Obesity, unspecified: Secondary | ICD-10-CM | POA: Diagnosis present

## 2018-03-09 DIAGNOSIS — I10 Essential (primary) hypertension: Secondary | ICD-10-CM | POA: Diagnosis present

## 2018-03-09 DIAGNOSIS — E1151 Type 2 diabetes mellitus with diabetic peripheral angiopathy without gangrene: Principal | ICD-10-CM | POA: Diagnosis present

## 2018-03-09 DIAGNOSIS — Z87891 Personal history of nicotine dependence: Secondary | ICD-10-CM

## 2018-03-09 DIAGNOSIS — Z951 Presence of aortocoronary bypass graft: Secondary | ICD-10-CM

## 2018-03-09 DIAGNOSIS — T508X5A Adverse effect of diagnostic agents, initial encounter: Secondary | ICD-10-CM | POA: Diagnosis not present

## 2018-03-09 DIAGNOSIS — Z6834 Body mass index (BMI) 34.0-34.9, adult: Secondary | ICD-10-CM

## 2018-03-09 DIAGNOSIS — I255 Ischemic cardiomyopathy: Secondary | ICD-10-CM | POA: Diagnosis present

## 2018-03-09 DIAGNOSIS — M7981 Nontraumatic hematoma of soft tissue: Secondary | ICD-10-CM | POA: Diagnosis not present

## 2018-03-09 DIAGNOSIS — Z7982 Long term (current) use of aspirin: Secondary | ICD-10-CM

## 2018-03-09 DIAGNOSIS — I70212 Atherosclerosis of native arteries of extremities with intermittent claudication, left leg: Secondary | ICD-10-CM | POA: Diagnosis present

## 2018-03-09 DIAGNOSIS — E1159 Type 2 diabetes mellitus with other circulatory complications: Secondary | ICD-10-CM | POA: Diagnosis present

## 2018-03-09 DIAGNOSIS — I729 Aneurysm of unspecified site: Secondary | ICD-10-CM

## 2018-03-09 DIAGNOSIS — N141 Nephropathy induced by other drugs, medicaments and biological substances: Secondary | ICD-10-CM | POA: Diagnosis not present

## 2018-03-09 DIAGNOSIS — Z794 Long term (current) use of insulin: Secondary | ICD-10-CM

## 2018-03-09 LAB — GLUCOSE, CAPILLARY
GLUCOSE-CAPILLARY: 97 mg/dL (ref 65–99)
GLUCOSE-CAPILLARY: 175 mg/dL — AB (ref 65–99)
Glucose-Capillary: 162 mg/dL — ABNORMAL HIGH (ref 65–99)

## 2018-03-09 LAB — BASIC METABOLIC PANEL
ANION GAP: 7 (ref 5–15)
BUN: 29 mg/dL — ABNORMAL HIGH (ref 6–20)
CALCIUM: 8.9 mg/dL (ref 8.9–10.3)
CO2: 23 mmol/L (ref 22–32)
Chloride: 110 mmol/L (ref 101–111)
Creatinine, Ser: 1.96 mg/dL — ABNORMAL HIGH (ref 0.61–1.24)
GFR calc Af Amer: 38 mL/min — ABNORMAL LOW (ref >60)
GFR calc non Af Amer: 32 mL/min — ABNORMAL LOW (ref >60)
Glucose, Bld: 147 mg/dL — ABNORMAL HIGH (ref 65–99)
Potassium: 5.2 mmol/L — ABNORMAL HIGH (ref 3.5–5.1)
Sodium: 140 mmol/L (ref 135–145)

## 2018-03-09 LAB — CBC
HCT: 34.7 % — ABNORMAL LOW (ref 39.0–52.0)
Hemoglobin: 10.9 g/dL — ABNORMAL LOW (ref 13.0–17.0)
MCH: 27.8 pg (ref 26.0–34.0)
MCHC: 31.4 g/dL (ref 30.0–36.0)
MCV: 88.5 fL (ref 78.0–100.0)
Platelets: 175 10*3/uL (ref 150–400)
RBC: 3.92 MIL/uL — AB (ref 4.22–5.81)
RDW: 13.8 % (ref 11.5–15.5)
WBC: 7.1 10*3/uL (ref 4.0–10.5)

## 2018-03-09 MED ORDER — SODIUM CHLORIDE 0.9% FLUSH: 3.0000 mL | Freq: Two times a day (BID) | INTRAVENOUS | Status: DC

## 2018-03-09 MED ORDER — LEVOTHYROXINE SODIUM 25 MCG PO TABS
25.0000 ug | ORAL_TABLET | Freq: Every day | ORAL | Status: DC
  Administered 2018-03-10 – 2018-03-13 (×4): 25 ug via ORAL
  Filled 2018-03-09 (×4): qty 1

## 2018-03-09 MED ORDER — SODIUM CHLORIDE 0.9% FLUSH: 3.0000 mL | INTRAVENOUS | Status: DC | PRN

## 2018-03-09 MED ORDER — FAMOTIDINE 20 MG PO TABS
20.0000 mg | ORAL_TABLET | Freq: Two times a day (BID) | ORAL | Status: DC
  Administered 2018-03-09 – 2018-03-13 (×8): 20 mg via ORAL
  Filled 2018-03-09 (×8): qty 1

## 2018-03-09 MED ORDER — PANTOPRAZOLE SODIUM 40 MG PO TBEC
40.0000 mg | DELAYED_RELEASE_TABLET | Freq: Two times a day (BID) | ORAL | Status: DC
  Administered 2018-03-09 – 2018-03-13 (×7): 40 mg via ORAL
  Filled 2018-03-09 (×6): qty 1

## 2018-03-09 MED ORDER — MAGNESIUM OXIDE 400 (241.3 MG) MG PO TABS
400.0000 mg | ORAL_TABLET | Freq: Every day | ORAL | Status: DC
  Administered 2018-03-10 – 2018-03-13 (×4): 400 mg via ORAL
  Filled 2018-03-09 (×4): qty 1

## 2018-03-09 MED ORDER — ACETAMINOPHEN 325 MG PO TABS: 650.0000 mg | ORAL_TABLET | ORAL | Status: DC | PRN

## 2018-03-09 MED ORDER — SODIUM CHLORIDE 0.9 % IV SOLN
INTRAVENOUS | Status: DC
  Administered 2018-03-09: via INTRAVENOUS

## 2018-03-09 MED ORDER — NIACIN ER (ANTIHYPERLIPIDEMIC) 500 MG PO TBCR
500.0000 mg | EXTENDED_RELEASE_TABLET | Freq: Every day | ORAL | Status: DC
  Administered 2018-03-09 – 2018-03-12 (×4): 500 mg via ORAL
  Filled 2018-03-09 (×6): qty 1

## 2018-03-09 MED ORDER — CLOPIDOGREL BISULFATE 75 MG PO TABS
75.0000 mg | ORAL_TABLET | Freq: Every day | ORAL | Status: DC
  Administered 2018-03-10 – 2018-03-13 (×4): 75 mg via ORAL
  Filled 2018-03-09 (×4): qty 1

## 2018-03-09 MED ORDER — FERROUS SULFATE 325 (65 FE) MG PO TABS
325.0000 mg | ORAL_TABLET | Freq: Three times a day (TID) | ORAL | Status: DC
  Administered 2018-03-09 – 2018-03-13 (×8): 325 mg via ORAL
  Filled 2018-03-09 (×10): qty 1

## 2018-03-09 MED ORDER — SODIUM CHLORIDE 0.9 % IV SOLN: 250.0000 mL | INTRAVENOUS | Status: DC | PRN

## 2018-03-09 MED ORDER — CARVEDILOL 12.5 MG PO TABS
12.5000 mg | ORAL_TABLET | Freq: Two times a day (BID) | ORAL | Status: DC
  Administered 2018-03-10 – 2018-03-13 (×5): 12.5 mg via ORAL
  Filled 2018-03-09 (×7): qty 1

## 2018-03-09 MED ORDER — ISOSORBIDE MONONITRATE ER 30 MG PO TB24
30.0000 mg | ORAL_TABLET | Freq: Every day | ORAL | Status: DC
  Administered 2018-03-10 – 2018-03-13 (×4): 30 mg via ORAL
  Filled 2018-03-09 (×4): qty 1

## 2018-03-09 MED ORDER — ASPIRIN 81 MG PO CHEW
81.0000 mg | CHEWABLE_TABLET | ORAL | Status: AC
  Administered 2018-03-10: 81 mg via ORAL
  Filled 2018-03-09: qty 1

## 2018-03-09 MED ORDER — MAGNESIUM 400 MG PO TABS: 400.0000 mg | ORAL_TABLET | Freq: Two times a day (BID) | ORAL | Status: DC

## 2018-03-09 MED ORDER — ATORVASTATIN CALCIUM 80 MG PO TABS
80.0000 mg | ORAL_TABLET | Freq: Every day | ORAL | Status: DC
  Administered 2018-03-09 – 2018-03-12 (×4): 80 mg via ORAL
  Filled 2018-03-09 (×4): qty 1

## 2018-03-09 MED ORDER — ASPIRIN EC 81 MG PO TBEC
81.0000 mg | DELAYED_RELEASE_TABLET | Freq: Every day | ORAL | Status: DC
  Administered 2018-03-11 – 2018-03-13 (×3): 81 mg via ORAL
  Filled 2018-03-09 (×4): qty 1

## 2018-03-09 MED ORDER — INSULIN ASPART 100 UNIT/ML ~~LOC~~ SOLN
0.0000 [IU] | Freq: Three times a day (TID) | SUBCUTANEOUS | Status: DC
  Administered 2018-03-09 – 2018-03-10 (×2): 2 [IU] via SUBCUTANEOUS
  Administered 2018-03-10: 3 [IU] via SUBCUTANEOUS
  Administered 2018-03-11: 2 [IU] via SUBCUTANEOUS
  Administered 2018-03-11: 18:00:00 5 [IU] via SUBCUTANEOUS
  Administered 2018-03-12 (×2): 3 [IU] via SUBCUTANEOUS
  Administered 2018-03-13: 5 [IU] via SUBCUTANEOUS
  Administered 2018-03-13: 7 [IU] via SUBCUTANEOUS

## 2018-03-09 MED ORDER — NITROGLYCERIN 0.4 MG SL SUBL: 0.4000 mg | SUBLINGUAL_TABLET | SUBLINGUAL | Status: DC | PRN
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
## 2018-03-09 NOTE — Progress Notes (Addendum)
  Entered in error. See H&P note   

## 2018-03-10 ENCOUNTER — Encounter (HOSPITAL_COMMUNITY): Payer: Self-pay | Admitting: General Practice

## 2018-03-10 ENCOUNTER — Encounter (HOSPITAL_COMMUNITY)
Admission: AD | Disposition: A | Discharge status: Home / Self Care | Payer: Self-pay | Source: Ambulatory Visit | Attending: Cardiovascular Disease

## 2018-03-10 DIAGNOSIS — I70212 Atherosclerosis of native arteries of extremities with intermittent claudication, left leg: Secondary | ICD-10-CM

## 2018-03-10 DIAGNOSIS — I739 Peripheral vascular disease, unspecified: Secondary | ICD-10-CM

## 2018-03-10 HISTORY — PX: PERIPHERAL VASCULAR INTERVENTION: CATH118257

## 2018-03-10 HISTORY — PX: LOWER EXTREMITY INTERVENTION: CATH118252

## 2018-03-10 HISTORY — PX: PERIPHERAL VASCULAR BALLOON ANGIOPLASTY: CATH118281

## 2018-03-10 LAB — BASIC METABOLIC PANEL
ANION GAP: 7 (ref 5–15)
BUN: 31 mg/dL — ABNORMAL HIGH (ref 6–20)
CO2: 23 mmol/L (ref 22–32)
Calcium: 8.8 mg/dL — ABNORMAL LOW (ref 8.9–10.3)
Chloride: 108 mmol/L (ref 101–111)
Creatinine, Ser: 1.82 mg/dL — ABNORMAL HIGH (ref 0.61–1.24)
GFR calc non Af Amer: 35 mL/min — ABNORMAL LOW (ref >60)
GFR, EST AFRICAN AMERICAN: 41 mL/min — AB (ref >60)
Glucose, Bld: 217 mg/dL — ABNORMAL HIGH (ref 65–99)
POTASSIUM: 4.9 mmol/L (ref 3.5–5.1)
Sodium: 138 mmol/L (ref 135–145)

## 2018-03-10 LAB — GLUCOSE, CAPILLARY
GLUCOSE-CAPILLARY: 167 mg/dL — AB (ref 65–99)
GLUCOSE-CAPILLARY: 140 mg/dL — AB (ref 65–99)
Glucose-Capillary: 225 mg/dL — ABNORMAL HIGH (ref 65–99)
Glucose-Capillary: 165 mg/dL — ABNORMAL HIGH (ref 65–99)

## 2018-03-10 LAB — POCT ACTIVATED CLOTTING TIME
ACTIVATED CLOTTING TIME: 191 s
ACTIVATED CLOTTING TIME: 169 s
Activated Clotting Time: 219 seconds
Activated Clotting Time: 241 seconds

## 2018-03-10 SURGERY — LOWER EXTREMITY INTERVENTION
Anesthesia: LOCAL

## 2018-03-10 MED ORDER — LIDOCAINE HCL (PF) 1 % IJ SOLN
INTRAMUSCULAR | Status: AC
  Filled 2018-03-10: qty 30

## 2018-03-10 MED ORDER — ASPIRIN 81 MG PO CHEW: 81.0000 mg | CHEWABLE_TABLET | Freq: Every day | ORAL | Status: DC

## 2018-03-10 MED ORDER — HEPARIN (PORCINE) IN NACL 1000-0.9 UT/500ML-% IV SOLN
INTRAVENOUS | Status: AC
  Filled 2018-03-10: qty 1000

## 2018-03-10 MED ORDER — SACUBITRIL-VALSARTAN 49-51 MG PO TABS
1.0000 | ORAL_TABLET | Freq: Two times a day (BID) | ORAL | Status: DC
  Administered 2018-03-10 – 2018-03-13 (×6): 1 via ORAL
  Filled 2018-03-10 (×7): qty 1

## 2018-03-10 MED ORDER — CLOPIDOGREL BISULFATE 75 MG PO TABS: 75.0000 mg | ORAL_TABLET | Freq: Every day | ORAL | Status: DC

## 2018-03-10 MED ORDER — FENTANYL CITRATE (PF) 100 MCG/2ML IJ SOLN
INTRAMUSCULAR | Status: AC
  Filled 2018-03-10: qty 2

## 2018-03-10 MED ORDER — CARVEDILOL 12.5 MG PO TABS
12.5000 mg | ORAL_TABLET | Freq: Once | ORAL | Status: AC
  Administered 2018-03-10: 12.5 mg via ORAL

## 2018-03-10 MED ORDER — MIDAZOLAM HCL 2 MG/2ML IJ SOLN
INTRAMUSCULAR | Status: DC | PRN
  Administered 2018-03-10: 1 mg via INTRAVENOUS

## 2018-03-10 MED ORDER — ANGIOPLASTY BOOK
Freq: Once | Status: AC
  Administered 2018-03-10: 22:00:00 1
  Filled 2018-03-10: qty 1

## 2018-03-10 MED ORDER — ATROPINE SULFATE 1 MG/10ML IJ SOSY
PREFILLED_SYRINGE | INTRAMUSCULAR | Status: AC
  Filled 2018-03-10: qty 10

## 2018-03-10 MED ORDER — HEPARIN SODIUM (PORCINE) 1000 UNIT/ML IJ SOLN
INTRAMUSCULAR | Status: AC
  Filled 2018-03-10: qty 1

## 2018-03-10 MED ORDER — HEPARIN (PORCINE) IN NACL 2-0.9 UNITS/ML
INTRAMUSCULAR | Status: AC | PRN
  Administered 2018-03-10: 1000 mL

## 2018-03-10 MED ORDER — LABETALOL HCL 5 MG/ML IV SOLN: 10.0000 mg | INTRAVENOUS | Status: AC | PRN

## 2018-03-10 MED ORDER — SODIUM CHLORIDE 0.9% FLUSH
3.0000 mL | Freq: Two times a day (BID) | INTRAVENOUS | Status: DC
  Administered 2018-03-10 – 2018-03-13 (×5): 3 mL via INTRAVENOUS

## 2018-03-10 MED ORDER — ACETAMINOPHEN 325 MG PO TABS: 650.0000 mg | ORAL_TABLET | ORAL | Status: DC | PRN

## 2018-03-10 MED ORDER — HYDRALAZINE HCL 20 MG/ML IJ SOLN
5.0000 mg | INTRAMUSCULAR | Status: AC | PRN
  Administered 2018-03-10 (×6): 5 mg via INTRAVENOUS
  Filled 2018-03-10 (×6): qty 1

## 2018-03-10 MED ORDER — MORPHINE SULFATE (PF) 2 MG/ML IV SOLN
2.0000 mg | INTRAVENOUS | Status: DC | PRN
  Administered 2018-03-10 – 2018-03-11 (×7): 2 mg via INTRAVENOUS
  Filled 2018-03-10 (×6): qty 1

## 2018-03-10 MED ORDER — ONDANSETRON HCL 4 MG/2ML IJ SOLN
4.0000 mg | Freq: Four times a day (QID) | INTRAMUSCULAR | Status: DC | PRN
  Administered 2018-03-10 – 2018-03-13 (×2): 4 mg via INTRAVENOUS
  Filled 2018-03-10 (×2): qty 2

## 2018-03-10 MED ORDER — SODIUM CHLORIDE 0.9 % IV SOLN
INTRAVENOUS | Status: AC
  Administered 2018-03-10: 11:00:00 via INTRAVENOUS

## 2018-03-10 MED ORDER — FENTANYL CITRATE (PF) 100 MCG/2ML IJ SOLN
INTRAMUSCULAR | Status: DC | PRN
  Administered 2018-03-10 (×2): 25 ug via INTRAVENOUS

## 2018-03-10 MED ORDER — MIDAZOLAM HCL 2 MG/2ML IJ SOLN
INTRAMUSCULAR | Status: AC
  Filled 2018-03-10: qty 2

## 2018-03-10 MED ORDER — HEPARIN SODIUM (PORCINE) 1000 UNIT/ML IJ SOLN
INTRAMUSCULAR | Status: DC | PRN
  Administered 2018-03-10: 3000 [IU] via INTRAVENOUS
  Administered 2018-03-10: 9000 [IU] via INTRAVENOUS

## 2018-03-10 MED ORDER — IODIXANOL 320 MG/ML IV SOLN
INTRAVENOUS | Status: DC | PRN
  Administered 2018-03-10: 101 mL via INTRA_ARTERIAL

## 2018-03-10 MED ORDER — LIDOCAINE HCL (PF) 1 % IJ SOLN
INTRAMUSCULAR | Status: DC | PRN
  Administered 2018-03-10: 15 mL via INTRADERMAL

## 2018-03-10 MED ORDER — SODIUM CHLORIDE 0.9 % IV SOLN
250.0000 mL | INTRAVENOUS | Status: DC | PRN
  Administered 2018-03-12 (×2): via INTRAVENOUS

## 2018-03-10 MED ORDER — SODIUM CHLORIDE 0.9% FLUSH: 3.0000 mL | INTRAVENOUS | Status: DC | PRN

## 2018-03-10 SURGICAL SUPPLY — 27 items
BALLN CHOCOLATE 4.0X80X135 (BALLOONS) ×4
BALLN IN.PACT DCB 5X80 (BALLOONS) ×4
BALLN STERLING OTW 5X40X135 (BALLOONS) ×4
BALLOON CHOCOLATE 4.0X80X135 (BALLOONS) ×3 IMPLANT
BALLOON STERLING OTW 5X40X135 (BALLOONS) ×3 IMPLANT
CATH ANGIO 5F PIGTAIL 65CM (CATHETERS) ×4 IMPLANT
CATH STRAIGHT 5FR 65CM (CATHETERS) ×4 IMPLANT
CATH TEMPO 5F RIM 65CM (CATHETERS) ×4 IMPLANT
DCB IN.PACT 5X80 (BALLOONS) ×3 IMPLANT
DEVICE CONTINUOUS FLUSH (MISCELLANEOUS) ×4 IMPLANT
KIT ENCORE 26 ADVANTAGE (KITS) ×4 IMPLANT
KIT PV (KITS) ×4 IMPLANT
SHEATH HIGHFLEX ANSEL 7FR 55CM (SHEATH) ×4 IMPLANT
SHEATH PINNACLE 5F 10CM (SHEATH) ×4 IMPLANT
SHEATH PINNACLE 7F 10CM (SHEATH) ×4 IMPLANT
SHEATH PINNACLE 8F 10CM (SHEATH) ×4 IMPLANT
STENT ABSOLUTE PRO 6X40X135 (Permanent Stent) ×4 IMPLANT
STOPCOCK MORSE 400PSI 3WAY (MISCELLANEOUS) ×4 IMPLANT
SYRINGE MEDRAD AVANTA MACH 7 (SYRINGE) ×4 IMPLANT
TAPE VIPERTRACK RADIOPAQ (MISCELLANEOUS) ×3 IMPLANT
TAPE VIPERTRACK RADIOPAQUE (MISCELLANEOUS) ×1
TRANSDUCER W/STOPCOCK (MISCELLANEOUS) ×4 IMPLANT
TRAY PV CATH (CUSTOM PROCEDURE TRAY) ×4 IMPLANT
TUBING CIL FLEX 10 FLL-RA (TUBING) ×4 IMPLANT
WIRE HITORQ VERSACORE ST 145CM (WIRE) ×4 IMPLANT
WIRE ROSEN-J .035X180CM (WIRE) ×4 IMPLANT
WIRE SPARTACORE .014X300CM (WIRE) ×8 IMPLANT

## 2018-03-10 NOTE — Interval H&P Note (Signed)
History and Physical Interval Note:  03/10/2018 8:49 AM  Dennis [name]  has presented today for surgery, with the diagnosis of pad  The various methods of treatment have been discussed with the patient and family. After consideration of risks, benefits and other options for treatment, the patient has consented to  Procedure(s): LOWER EXTREMITY INTERVENTION (N/A) as a surgical intervention .  The patient's history has been reviewed, patient examined, no change in status, stable for surgery.  I have reviewed the patient's chart and labs.  Questions were answered to the patient's satisfaction.     Nanetta BattyJonathan Raelee Rossmann

## 2018-03-10 NOTE — Progress Notes (Addendum)
MD on call notified BP 195/90. HS Coreg 12.5mg   held due to bradycardia sustaining in 50's and dropping to 40's non sustained. MD ordered Coreg 12.5mg  x1 to be given now. Will continue to monitor. Dierdre HighmanHall, Dayanis Bergquist Marie, RN   [phone]: HR at time of administration 77 bpm

## 2018-03-10 NOTE — Progress Notes (Signed)
Pt doing well post intervention to Left SFA. No evidence of volume overload. Recheck BMET in AM and if renal function stable, DC home on preadmission meds. Olga MillersBrian Crenshaw

## 2018-03-10 NOTE — Progress Notes (Signed)
Site area: right groin  Site Prior to Removal:  Level 1, oozing blood  Pressure Applied For 25 MINUTES    Minutes Beginning at 1435  Manual:   Yes.    Patient Status During Pull:  stable  Post Pull Groin Site:  Level 0  Post Pull Instructions Given:  Yes.    Post Pull Pulses Present:  Yes.    Dressing Applied:  Yes.    Comments:

## 2018-03-10 NOTE — Discharge Summary (Addendum)
Discharge Summary    Patient ID: Dennis [name],  MRN: [id number], DOB/AGE: 12/29/1945 72 y.o.  Admit date: 03/09/2018 Discharge date: 03/11/2018  Primary Care Provider: System, Pcp Not In Primary Cardiologist: Nanetta Batty, MD  Discharge Diagnoses    Principal Problem:   PVD - bilat SFA disease - s/p multiple PTA Active Problems:   Essential hypertension   Hyperlipidemia   Type 2 diabetes mellitus with circulatory disorder (HCC)   Tobacco abuse   Hx of CABG   Claudication, class III (HCC)   PAD (peripheral artery disease) (HCC)   Ischemic cardiomyopathy   Chronic renal insufficiency   Pseudoaneurysm (HCC)  Allergies Allergies  Allergen Reactions  . Other Other (See Comments)    Seasonal  Nasal congestion   . Chlorhexidine Rash   Diagnostic Studies/Procedures    Peripheral vascular catheterization 03/10/18:  1: Abdominal aortogram-widely patent 2: Right lower extremity- the right common and external iliac arteries are widely patent 3: Left lower extremity- the left common and external iliac artery widely patent.  There were 80% tandem lesions in the proximal left SFA, 50% mid and distal with one-vessel runoff via peroneal.  The peroneal had a segmental 80% proximal stenosis  IMPRESSION: [name] has high-grade proximal left SFA stenosis which corresponds to the area of abnormality on duplex ultrasound and probably responsible for his decreased left ABI and symptoms of claudication.  We will proceed with chocolate balloon angioplasty followed by drug-eluting balloon angioplasty.  Final Impression: Successful chocolate balloon angioplasty followed by drug-eluting balloon angioplasty and nitinol self-expanding stenting of a hemodynamically significant proximal left SFA stenosis in the setting of lifestyle limiting claudication with one-vessel runoff via a diseased peroneal artery.  The patient did receive a total of 100 cc of contrast.  He will be gently hydrated for  the next 12 hours.  His serum creatinine was 1.8.  He will be discharged home in the morning and will get follow-up lower extremity arterial Doppler studies in our [place] line office next week.  I will see him back in 2 to 3 weeks thereafter.  He left the lab in stable condition.   Lower extremity vascular ultrasound, pseudoaneurysm 03/11/2018: Findings:  An area with well defined borders measuring 3.8 cm x 2.8 cm was visualized arising off of the Right CFA with ultrasound characteristics of a pseudoaneurysm. The neck measures approximately 0.4 cm wide and 1.1 cm long.  History of Present Illness     [name] is a 72 yo male (retired [employer]) with a history of CAD s/p CABG x 4 by Dr. Laneta Simmers in 2014, significant PAD (well outlined in Dr. Hazle Coca notes), DM2, ischemic cardiomyopathy with EF 35-40% and CKD III (baseline Cr 1.7-1.8) who presents to [facility] on 03/09/18 for direct admit for pre-cath hydration, prior to planned PV angiogram w/ Dr. [name] on 03/10/18.  Pt was admitted to [facility] on 03/09/18 for a planned elective PV angiogram with Dr. [name] on 03/10/18 secondary to recent lifestyle limiting LE claudication. Recent ABI's were reduced on the left at 0.74 and 0.86 on the right. His last BMET on 03/06/18 showed a creatinine of 2.15 (baseline of approximately 1.8). On day of admission, his creatinine was 1.96 and was scheduled for first case at 0730 with Dr. [name]. His Lasix and Entresto were held on day of admission secondary to renal function.   He was intermittently bradycardiac on admission, however has stated that his HR is always in the upper 40's. He denies  chest pain, syncope, or near syncope. No orthopnea, abdominal bloating. Claudication stable L>R. No ulcers present.   Hospital Course    On 03/10/18, [name] underwent a successful peripheral vascular balloon angioplasty with DES intervention of the left SFA described as high-grade proximal left SFA stenosis which corresponds to the  area of abnormality on duplex ultrasound and probably responsible for his decreased left ABI and symptoms of claudication without complication.   Per PV note, he received a total of 100 cc of contrast with recommendations to keep overnight to gently hydrate and recheck his renal function today, 03/11/18.  His creatinine on day of procedure was 1.82 and is now improved on day of discharge to 1.74.   We will schedule him for follow-up lower extremity arterial Doppler studies in our [place] office next week and to see Dr. [name] for follow up in 2 to 3 weeks thereafter. Appointments have been made. No medication changes at this time.   Unfortunately, his hospital course was complicated by right groin procedural site pain in which vascular duplex ultrasound was performed on 03/11/2018.  He was found to have a pseudoaneurysm at 3.8 cm X 2.8 cm arising off of the right common femoral artery not thought to be compressible. Therefore, VVS was consulted for further management.  Per consult note, given the fairly short neck, he was not felt to be a good candidate for compression therapy nor a candidate for thrombin injection which would place him at high risk for thrombosis of the femoral artery.  VVS recommend surgical repair.  Unfortunately, the patient had a fairly large lunch which excludes him from surgical intervention today.  VVS schedule is full for tomorrow.  Vascular recommends discharging today 03/11/2018 and he will return on Thursday, 03/13/2018 for surgical repair of pseudoaneurysm.  Doreatha Massed, Vascular PA informed RN that office will call patient with instructions for anticipated surgery on Thursday, 03/13/18. Spoke with VVS PA who states that the pt can continue Plavix therapy at this time. We will discharge today as planned.   General: Well developed, well nourished, NAD Skin: Warm, dry, intact  Head: Normocephalic, atraumatic, clear, moist mucus membranes. Neck: Negative for carotid bruits. No  JVD Lungs:Clear to ausculation bilaterally. No wheezes, rales, or rhonchi. Breathing is unlabored. Cardiovascular: RRR with S1 S2. No murmurs, rubs or gallops Abdomen: Soft, non-tender, non-distended with normoactive bowel sounds. No obvious abdominal masses. MSK: Strength and tone appear normal for age. 5/5 in all extremities Extremities: No edema. No clubbing or cyanosis. DP/PT pulses 1+ bilaterally Neuro: Alert and oriented. No focal deficits. No facial asymmetry. MAE spontaneously. Psych: Responds to questions appropriately with normal affect.    Consultants: Vascular Vein Specialist   The patient was seen and examined by Dr. Jens Som who feels that he is stable and ready for discharge today, 03/11/18. Pt has no complaints at this time. Discussed site care. Pt acknowledges understanding. Will attach instructions as well.   _____________  Discharge Vitals Blood pressure (!) 156/74, pulse 73, temperature 98.5 F (36.9 C), resp. rate (!) 25, height 5\' 9"  (1.753 m), weight 238 lb 1.6 oz (108 kg), SpO2 97 %.  Filed Weights   03/09/18 1333 03/10/18 0502 03/11/18 0647  Weight: 235 lb 14.4 oz (107 kg) 236 lb 9.6 oz (107.3 kg) 238 lb 1.6 oz (108 kg)   Labs & Radiologic Studies    CBC Recent Labs    03/09/18 1528 03/11/18 0238  WBC 7.1 6.6  HGB 10.9* 10.3*  HCT 34.7* 32.9*  MCV 88.5 87.7  PLT 175 157   Basic Metabolic Panel Recent Labs    16/10/96 0634 03/11/18 0238  NA 138 138  K 4.9 4.9  CL 108 111  CO2 23 22  GLUCOSE 217* 165*  BUN 31* 27*  CREATININE 1.82* 1.74*  CALCIUM 8.8* 9.1  _____________  Disposition   Pt is being discharged home today in good condition.  Follow-up Plans & Appointments    Follow-up Information    [facility] Follow up on 03/18/2018.   Specialty:  Cardiology Why:  Your doppler appointment will be on 03/18/18 at 1230pm. Please arrive at 1215pm.  Contact information: [address] [place]  [address] [phone]       [name], MD Follow up on 03/28/2018.   Specialties:  Cardiology, Radiology Why:  Your appointment with Dr. [name] will be on 03/28/18 at 1030am. Please arrive at 1015am.  Contact information: [address] [place] [address] [phone]          Discharge Instructions    AMB Referral to Cardiac Rehabilitation - Phase II   Complete by:  As directed    Diagnosis:  Other   Call MD for:  redness, tenderness, or signs of infection (pain, swelling, redness, odor or green/yellow discharge around incision site)   Complete by:  As directed    Call MD for:  severe uncontrolled pain   Complete by:  As directed    Call MD for:  temperature >100.4   Complete by:  As directed    Diet - low sodium heart healthy   Complete by:  As directed    Discharge instructions   Complete by:  As directed    You can continue your Plavix at this time. I spoke with surgical PA with vascular services who states that you can continue your Plavix without complicating surgery planned for Thursday, 03/13/2018.  Dr. Adele Dan office will contact you with specifics about your upcoming surgery.  No driving for until cleared by Cardiology or Vascular surgery. Keep procedure site clean & dry. If you notice increased pain, swelling, bleeding or pus, call/return!  You may shower, but no soaking baths/hot tubs/pools for 1 week.   If you notice any bleeding such as blood in stool, black tarry stools, blood in urine, nosebleeds or any other unusual bleeding, call your doctor immediately.  We have set you up with lower extremity dopplers at our [facility] office as well as a follow-up visit with Dr. [name].  These appointment times and dates are attached on your discharge instructions.   Increase activity slowly   Complete by:  As directed       Discharge Medications   Allergies as of 03/11/2018      Reactions   Other Other (See Comments)   Seasonal  Nasal congestion     Chlorhexidine Rash      Medication List    TAKE these medications   acetaminophen 325 MG tablet Commonly known as:  TYLENOL Take 2 tablets (650 mg total) by mouth every 4 (four) hours as needed for headache or mild pain.   aspirin 81 MG tablet Take 81 mg by mouth daily.   atorvastatin 80 MG tablet Commonly known as:  LIPITOR Take 1 tablet (80 mg total) by mouth daily. What changed:  when to take this   carvedilol 12.5 MG tablet Commonly known as:  COREG Take 12.5 mg by mouth 2 (two) times daily.   clopidogrel  75 MG tablet Commonly known as:  PLAVIX Take 75 mg by mouth daily.   ENTRESTO 49-51 MG Generic drug:  sacubitril-valsartan Take 1 tablet by mouth 2 (two) times daily.   ferrous sulfate 325 (65 FE) MG EC tablet Take 325 mg by mouth 3 (three) times daily with meals.   insulin NPH-regular Human (70-30) 100 UNIT/ML injection Commonly known as:  NOVOLIN 70/30 Inject 30-50 Units into the skin See admin instructions. INJECT 50 UNITS SUBCUTANEOUSLY IN THE MORNING & 30 UNITS SUBCUTANEOUSLY AT NIGHT   isosorbide mononitrate 30 MG 24 hr tablet Commonly known as:  IMDUR Take 30 mg by mouth daily.   levothyroxine 25 MCG tablet Commonly known as:  SYNTHROID, LEVOTHROID Take 25 mcg by mouth daily before breakfast.   Magnesium 400 MG Tabs Take 400 mg by mouth 2 (two) times daily.   niacin 500 MG CR tablet Commonly known as:  NIASPAN Take 1 tablet (500 mg total) by mouth at bedtime.   nitroGLYCERIN 0.4 MG SL tablet Commonly known as:  NITROSTAT Place 0.4 mg under the tongue every 5 (five) minutes as needed for chest pain.   pantoprazole 40 MG tablet Commonly known as:  PROTONIX Take 40 mg by mouth 2 (two) times daily before a meal.   ranitidine 150 MG tablet Commonly known as:  ZANTAC Take 150 mg by mouth 2 (two) times daily.      Outstanding Labs/Studies   BMET at follow up   VVS office to call patient with specific pre-surgical instructions for planned  pseudoaneurysm repair plan for 03/13/2018.  Patient aware.  Duration of Discharge Encounter   Greater than 30 minutes including physician time.  Signed, Daron OfferJill Lavon Horn Faraaz Wolin, NP 03/11/2018, 4:11 PM  As above, patient seen and examined.  Patient denies chest pain or dyspnea.  Right groin shows small hematoma and there is a bruit noted.  I will plan to proceed with ultrasound of right groin to exclude pseudoaneurysm or AV fistula.  If negative he can be discharged on preadmission medications including Entresto.  Follow-up with Dr. Allyson SabalBerry as scheduled. Repeat bmet one week. Georgie ChardJill Filimon Miranda, NP   Addendum-patient has pseudoaneurysm.  This will need to be compressed prior to discharge. Georgie ChardJill Milt Coye, NP

## 2018-03-10 NOTE — Progress Notes (Signed)
Per Victorino DikeJennifer with Cath Lab-labs not drawn yet this morning. I personally called Lab Tech and notified of pts first case procedure and labs needed prior to going. Lab tech states she will see if someone can come. BMP changed to stat order/awaiting lab draw. Dierdre HighmanHall, Dorn Hartshorne Marie, RN

## 2018-03-11 ENCOUNTER — Observation Stay (HOSPITAL_BASED_OUTPATIENT_CLINIC_OR_DEPARTMENT_OTHER): Payer: Medicare HMO

## 2018-03-11 ENCOUNTER — Other Ambulatory Visit: Payer: Self-pay | Admitting: Cardiology

## 2018-03-11 ENCOUNTER — Other Ambulatory Visit (HOSPITAL_COMMUNITY): Payer: Self-pay

## 2018-03-11 DIAGNOSIS — I729 Aneurysm of unspecified site: Secondary | ICD-10-CM

## 2018-03-11 DIAGNOSIS — I739 Peripheral vascular disease, unspecified: Secondary | ICD-10-CM

## 2018-03-11 DIAGNOSIS — I2510 Atherosclerotic heart disease of native coronary artery without angina pectoris: Secondary | ICD-10-CM

## 2018-03-11 DIAGNOSIS — M47816 Spondylosis without myelopathy or radiculopathy, lumbar region: Secondary | ICD-10-CM | POA: Insufficient documentation

## 2018-03-11 DIAGNOSIS — Z9889 Other specified postprocedural states: Secondary | ICD-10-CM | POA: Diagnosis not present

## 2018-03-11 DIAGNOSIS — I10 Essential (primary) hypertension: Secondary | ICD-10-CM

## 2018-03-11 DIAGNOSIS — I70212 Atherosclerosis of native arteries of extremities with intermittent claudication, left leg: Secondary | ICD-10-CM

## 2018-03-11 DIAGNOSIS — I724 Aneurysm of artery of lower extremity: Secondary | ICD-10-CM

## 2018-03-11 LAB — CBC
HCT: 32.9 % — ABNORMAL LOW (ref 39.0–52.0)
HEMOGLOBIN: 10.3 g/dL — AB (ref 13.0–17.0)
MCH: 27.5 pg (ref 26.0–34.0)
MCHC: 31.3 g/dL (ref 30.0–36.0)
MCV: 87.7 fL (ref 78.0–100.0)
Platelets: 157 10*3/uL (ref 150–400)
RBC: 3.75 MIL/uL — ABNORMAL LOW (ref 4.22–5.81)
RDW: 13.6 % (ref 11.5–15.5)
WBC: 6.6 10*3/uL (ref 4.0–10.5)

## 2018-03-11 LAB — GLUCOSE, CAPILLARY
GLUCOSE-CAPILLARY: 189 mg/dL — AB (ref 70–99)
Glucose-Capillary: 185 mg/dL — ABNORMAL HIGH (ref 70–99)
Glucose-Capillary: 267 mg/dL — ABNORMAL HIGH (ref 70–99)

## 2018-03-11 LAB — BASIC METABOLIC PANEL
ANION GAP: 5 (ref 5–15)
BUN: 27 mg/dL — ABNORMAL HIGH (ref 8–23)
CHLORIDE: 111 mmol/L (ref 98–111)
CO2: 22 mmol/L (ref 22–32)
Calcium: 9.1 mg/dL (ref 8.9–10.3)
Creatinine, Ser: 1.74 mg/dL — ABNORMAL HIGH (ref 0.61–1.24)
GFR calc Af Amer: 43 mL/min — ABNORMAL LOW (ref >60)
GFR calc non Af Amer: 37 mL/min — ABNORMAL LOW (ref >60)
GLUCOSE: 165 mg/dL — AB (ref 70–99)
POTASSIUM: 4.9 mmol/L (ref 3.5–5.1)
Sodium: 138 mmol/L (ref 135–145)

## 2018-03-11 LAB — HEMOGLOBIN AND HEMATOCRIT, BLOOD
HEMATOCRIT: 31.4 % — AB (ref 39.0–52.0)
Hemoglobin: 9.8 g/dL — ABNORMAL LOW (ref 13.0–17.0)

## 2018-03-11 MED ORDER — MORPHINE SULFATE (PF) 4 MG/ML IV SOLN
INTRAVENOUS | Status: AC
  Filled 2018-03-11: qty 1

## 2018-03-11 MED ORDER — HYDRALAZINE HCL 20 MG/ML IJ SOLN
10.0000 mg | INTRAMUSCULAR | Status: DC | PRN
  Administered 2018-03-11 – 2018-03-12 (×2): 10 mg via INTRAVENOUS
  Filled 2018-03-11 (×2): qty 1

## 2018-03-11 MED ORDER — HYDRALAZINE HCL 20 MG/ML IJ SOLN
INTRAMUSCULAR | Status: AC
  Filled 2018-03-11: qty 1

## 2018-03-11 MED ORDER — HYDRALAZINE HCL 25 MG PO TABS
25.0000 mg | ORAL_TABLET | Freq: Three times a day (TID) | ORAL | Status: DC
  Administered 2018-03-11 – 2018-03-13 (×4): 25 mg via ORAL
  Filled 2018-03-11 (×5): qty 1

## 2018-03-11 NOTE — Progress Notes (Addendum)
    S: Pt was prepping for d/c and asked nurse for tylenol related to severe R groin pain.  Groin assessed by RN and was noted to have recurrence of hematoma.  Pt was placed on bedrest and pressure held. BP markedly elevated in the 190's.   O:   Vitals:   03/11/18 1800 03/11/18 1815  BP: (!) 185/61 (!) 187/79  Pulse: (!) 49 (!) 47  Resp: 16 17  Temp:    SpO2: 98% 100%    On my arrival, Groin relatively soft with large palpable hematoma extending from arterial stick site  medially ~ 3cm x 4cm.  Pt was given a total of 4 mg of morphine and hydralazine 10mg  over a period of 40 mins.  I held manual pressure for an additional 30 mins.  Site remained stable - relatively soft. Elastoplast applied. Discussed w/ nsg staff.  Need aggressive BP mgmt and bedrest tonight.  Cont  blocker and entresto.  Considered increasing entresto but w/ CKD and h/o mild hyperkalemia, likely not ideal.  Adding PO hydralazine and prn IV hydralazine.  Discharge cancelled.  I'll keep NPO after midnight in case OR an option sooner than Thursday. If recurrence of bleeding/hematoma grows  femstop.  F/u h/h tonight and in AM.  Nicolasa Duckinghristopher Aaralyn Kil, NP

## 2018-03-11 NOTE — Progress Notes (Signed)
1715 patient walked out to nursing station and stated he needed tylenol for pain. Asked if it was for a headache, he answered no and pointed to his right groin. Instructed to return to his room and lye in bed. Right groin had a large hematoma and bruising. Pressure held and called for help. Ward Givenshris Berge PA textpaged and notified of patient condition. Orders for morphine and hydralazine given and will give medication to decrease blood pressure and hold pressure on groin. Patient uncomfortable and responds well to morphine and hydralazine. Pressure held 1715 to 1745 by Victorino DecemberLaura Veleta Yamamoto RN, Ward Givenshris Berge PA into see patient and resumed holding pressure on groin site. Additional IV medication given and pressure held until 1815. Pressure dressing applied on site, small hematoma and bruising noted at site. Most of the area now soft. Patient resting comfortably. Instructions given to keep right leg straight and call for any noted changes at groin site. Wife at bedside, plan of care reviewed with patient and wife and questions answered.
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
## 2018-03-11 NOTE — Progress Notes (Signed)
Right groin limited arterial duplex prelim:   An area with well defined borders measuring 3.8 cm x 2.8 cm was visualized arising off of the Right CFA with ultrasound characteristics of a pseudoaneurysm. The neck measures approximately 0.4 cm wide and 1.1 cm long.    Farrel DemarkJill Eunice, RDMS, RVT

## 2018-03-11 NOTE — Care Management Obs Status (Signed)
MEDICARE OBSERVATION STATUS NOTIFICATION   Patient Details  Name: Dennis ReamsGeorge Bell Jr MRN: [id number] Date of Birth: 1946-05-07   Medicare Observation Status Notification Given:       Leone Havenaylor, Morio Widen Clinton, RN 03/11/2018, 10:15 AM

## 2018-03-11 NOTE — Progress Notes (Signed)
1900 Patients wife came to nurses station and reported patient complaining of increased pain at groin site. Held pressure, blood pressure elevated and IV meds given for pain and blood pressure control, morphine and hydralazine. Femostop applied as instructed by Ward Givenshris Berge PA after he assessed the site. Roselle Zarsona at bedside and given report. Patient resting and reviewed instructions again, emphasizing to call if any changes noted at groin site.

## 2018-03-11 NOTE — Progress Notes (Signed)
Spoke with Doreatha MassedSamantha Rhyne, vascular PAC, and she informed me that the office is working on setting up the surgery for Thursday morning. The office  will call patient once everything is set up and and review pre-op instructions. Notified Lavena StanfordJillian McDaniel PA for cardiology about above information.

## 2018-03-12 ENCOUNTER — Observation Stay (HOSPITAL_COMMUNITY): Payer: Medicare HMO | Admitting: Anesthesiology

## 2018-03-12 ENCOUNTER — Encounter (HOSPITAL_COMMUNITY): Payer: Self-pay | Admitting: Orthopedic Surgery

## 2018-03-12 ENCOUNTER — Encounter (HOSPITAL_COMMUNITY)
Admission: AD | Disposition: A | Discharge status: Home / Self Care | Payer: Self-pay | Source: Ambulatory Visit | Attending: Cardiovascular Disease

## 2018-03-12 DIAGNOSIS — I739 Peripheral vascular disease, unspecified: Secondary | ICD-10-CM | POA: Diagnosis not present

## 2018-03-12 DIAGNOSIS — I9789 Other postprocedural complications and disorders of the circulatory system, not elsewhere classified: Secondary | ICD-10-CM | POA: Diagnosis not present

## 2018-03-12 DIAGNOSIS — I729 Aneurysm of unspecified site: Secondary | ICD-10-CM

## 2018-03-12 HISTORY — PX: APPLICATION OF WOUND VAC: SHX5189

## 2018-03-12 HISTORY — PX: FALSE ANEURYSM REPAIR: SHX5152

## 2018-03-12 LAB — GLUCOSE, CAPILLARY
GLUCOSE-CAPILLARY: 160 mg/dL — AB (ref 70–99)
GLUCOSE-CAPILLARY: 221 mg/dL — AB (ref 70–99)
Glucose-Capillary: 223 mg/dL — ABNORMAL HIGH (ref 70–99)
Glucose-Capillary: 229 mg/dL — ABNORMAL HIGH (ref 70–99)
Glucose-Capillary: 201 mg/dL — ABNORMAL HIGH (ref 70–99)

## 2018-03-12 LAB — BASIC METABOLIC PANEL
Anion gap: 7 (ref 5–15)
BUN: 27 mg/dL — AB (ref 8–23)
CHLORIDE: 109 mmol/L (ref 98–111)
CO2: 20 mmol/L — ABNORMAL LOW (ref 22–32)
CREATININE: 1.98 mg/dL — AB (ref 0.61–1.24)
Calcium: 8.9 mg/dL (ref 8.9–10.3)
GFR calc Af Amer: 37 mL/min — ABNORMAL LOW (ref >60)
GFR calc non Af Amer: 32 mL/min — ABNORMAL LOW (ref >60)
GLUCOSE: 199 mg/dL — AB (ref 70–99)
Potassium: 4.7 mmol/L (ref 3.5–5.1)
Sodium: 136 mmol/L (ref 135–145)

## 2018-03-12 LAB — SURGICAL PCR SCREEN
MRSA, PCR: NEGATIVE
Staphylococcus aureus: POSITIVE — AB

## 2018-03-12 LAB — CBC
HCT: 30.7 % — ABNORMAL LOW (ref 39.0–52.0)
HEMOGLOBIN: 9.6 g/dL — AB (ref 13.0–17.0)
MCH: 27.4 pg (ref 26.0–34.0)
MCHC: 31.3 g/dL (ref 30.0–36.0)
MCV: 87.5 fL (ref 78.0–100.0)
Platelets: 147 10*3/uL — ABNORMAL LOW (ref 150–400)
RBC: 3.51 MIL/uL — ABNORMAL LOW (ref 4.22–5.81)
RDW: 13.8 % (ref 11.5–15.5)
WBC: 6.7 10*3/uL (ref 4.0–10.5)

## 2018-03-12 SURGERY — REPAIR, PSEUDOANEURYSM
Anesthesia: General | Site: Groin | Laterality: Right

## 2018-03-12 MED ORDER — SUGAMMADEX SODIUM 200 MG/2ML IV SOLN
INTRAVENOUS | Status: DC | PRN
  Administered 2018-03-12: 200 mg via INTRAVENOUS

## 2018-03-12 MED ORDER — HEPARIN SODIUM (PORCINE) 5000 UNIT/ML IJ SOLN
5000.0000 [IU] | Freq: Three times a day (TID) | INTRAMUSCULAR | Status: DC
  Administered 2018-03-12 – 2018-03-13 (×2): 5000 [IU] via SUBCUTANEOUS
  Filled 2018-03-12 (×2): qty 1

## 2018-03-12 MED ORDER — HEMOSTATIC AGENTS (NO CHARGE) OPTIME
TOPICAL | Status: DC | PRN
  Administered 2018-03-12: 1 via TOPICAL

## 2018-03-12 MED ORDER — FENTANYL CITRATE (PF) 100 MCG/2ML IJ SOLN
INTRAMUSCULAR | Status: AC
  Filled 2018-03-12: qty 2

## 2018-03-12 MED ORDER — SODIUM CHLORIDE 0.9 % IV SOLN
INTRAVENOUS | Status: DC | PRN
  Administered 2018-03-12: 500 mL

## 2018-03-12 MED ORDER — 0.9 % SODIUM CHLORIDE (POUR BTL) OPTIME
TOPICAL | Status: DC | PRN
  Administered 2018-03-12: 1000 mL

## 2018-03-12 MED ORDER — ROCURONIUM BROMIDE 10 MG/ML (PF) SYRINGE
PREFILLED_SYRINGE | INTRAVENOUS | Status: DC | PRN
  Administered 2018-03-12: 50 mg via INTRAVENOUS

## 2018-03-12 MED ORDER — FENTANYL CITRATE (PF) 250 MCG/5ML IJ SOLN
INTRAMUSCULAR | Status: AC
  Filled 2018-03-12: qty 5

## 2018-03-12 MED ORDER — ALBUMIN HUMAN 5 % IV SOLN
INTRAVENOUS | Status: DC | PRN
  Administered 2018-03-12: 17:00:00 via INTRAVENOUS

## 2018-03-12 MED ORDER — ONDANSETRON HCL 4 MG/2ML IJ SOLN
INTRAMUSCULAR | Status: AC
  Filled 2018-03-12: qty 2

## 2018-03-12 MED ORDER — OXYCODONE HCL 5 MG PO TABS: 5.0000 mg | ORAL_TABLET | Freq: Once | ORAL | Status: DC | PRN

## 2018-03-12 MED ORDER — OXYCODONE-ACETAMINOPHEN 5-325 MG PO TABS
1.0000 | ORAL_TABLET | ORAL | Status: DC | PRN
  Administered 2018-03-12: 2 via ORAL
  Filled 2018-03-12: qty 2

## 2018-03-12 MED ORDER — SODIUM CHLORIDE 0.9 % IV SOLN
INTRAVENOUS | Status: AC
  Filled 2018-03-12: qty 1.2

## 2018-03-12 MED ORDER — DEXAMETHASONE SODIUM PHOSPHATE 10 MG/ML IJ SOLN
INTRAMUSCULAR | Status: AC
  Filled 2018-03-12: qty 1

## 2018-03-12 MED ORDER — INSULIN ASPART 100 UNIT/ML ~~LOC~~ SOLN
5.0000 [IU] | Freq: Once | SUBCUTANEOUS | Status: AC
  Administered 2018-03-12: 5 [IU] via SUBCUTANEOUS

## 2018-03-12 MED ORDER — ONDANSETRON HCL 4 MG/2ML IJ SOLN: 4.0000 mg | Freq: Once | INTRAMUSCULAR | Status: DC | PRN

## 2018-03-12 MED ORDER — ONDANSETRON HCL 4 MG/2ML IJ SOLN
INTRAMUSCULAR | Status: DC | PRN
  Administered 2018-03-12: 4 mg via INTRAVENOUS

## 2018-03-12 MED ORDER — LIDOCAINE 2% (20 MG/ML) 5 ML SYRINGE
INTRAMUSCULAR | Status: AC
  Filled 2018-03-12: qty 5

## 2018-03-12 MED ORDER — DEXAMETHASONE SODIUM PHOSPHATE 10 MG/ML IJ SOLN
INTRAMUSCULAR | Status: DC | PRN
  Administered 2018-03-12: 10 mg via INTRAVENOUS

## 2018-03-12 MED ORDER — MUPIROCIN 2 % EX OINT
1.0000 "application " | TOPICAL_OINTMENT | Freq: Two times a day (BID) | CUTANEOUS | Status: DC
  Administered 2018-03-12 – 2018-03-13 (×3): 1 via NASAL
  Filled 2018-03-12 (×2): qty 22

## 2018-03-12 MED ORDER — MUPIROCIN 2 % EX OINT: 1.0000 "application " | TOPICAL_OINTMENT | Freq: Two times a day (BID) | CUTANEOUS | Status: DC

## 2018-03-12 MED ORDER — SODIUM CHLORIDE 0.9 % IV SOLN
INTRAVENOUS | Status: DC
  Administered 2018-03-12 – 2018-03-13 (×2): via INTRAVENOUS

## 2018-03-12 MED ORDER — LIDOCAINE 2% (20 MG/ML) 5 ML SYRINGE
INTRAMUSCULAR | Status: DC | PRN
  Administered 2018-03-12: 100 mg via INTRAVENOUS

## 2018-03-12 MED ORDER — CEFAZOLIN SODIUM-DEXTROSE 1-4 GM/50ML-% IV SOLN: 1.0000 g | INTRAVENOUS | Status: DC

## 2018-03-12 MED ORDER — SODIUM CHLORIDE 0.9 % IV SOLN: INTRAVENOUS | Status: DC

## 2018-03-12 MED ORDER — OXYCODONE HCL 5 MG/5ML PO SOLN: 5.0000 mg | Freq: Once | ORAL | Status: DC | PRN

## 2018-03-12 MED ORDER — CEFAZOLIN SODIUM-DEXTROSE 2-4 GM/100ML-% IV SOLN
2.0000 g | INTRAVENOUS | Status: AC
  Administered 2018-03-12: 2 g via INTRAVENOUS

## 2018-03-12 MED ORDER — FENTANYL CITRATE (PF) 100 MCG/2ML IJ SOLN
25.0000 ug | INTRAMUSCULAR | Status: DC | PRN
  Administered 2018-03-12: 50 ug via INTRAVENOUS

## 2018-03-12 MED ORDER — FENTANYL CITRATE (PF) 250 MCG/5ML IJ SOLN
INTRAMUSCULAR | Status: DC | PRN
  Administered 2018-03-12: 50 ug via INTRAVENOUS
  Administered 2018-03-12: 100 ug via INTRAVENOUS

## 2018-03-12 MED ORDER — ROCURONIUM BROMIDE 50 MG/5ML IV SOLN
INTRAVENOUS | Status: AC
  Filled 2018-03-12: qty 1

## 2018-03-12 MED ORDER — SUGAMMADEX SODIUM 200 MG/2ML IV SOLN
INTRAVENOUS | Status: AC
  Filled 2018-03-12: qty 2

## 2018-03-12 MED ORDER — EPHEDRINE SULFATE-NACL 50-0.9 MG/10ML-% IV SOSY
PREFILLED_SYRINGE | INTRAVENOUS | Status: DC | PRN
  Administered 2018-03-12: 5 mg via INTRAVENOUS
  Administered 2018-03-12: 10 mg via INTRAVENOUS
  Administered 2018-03-12: 5 mg via INTRAVENOUS
  Administered 2018-03-12: 15 mg via INTRAVENOUS

## 2018-03-12 MED ORDER — PROPOFOL 10 MG/ML IV BOLUS
INTRAVENOUS | Status: DC | PRN
  Administered 2018-03-12: 100 mg via INTRAVENOUS

## 2018-03-12 SURGICAL SUPPLY — 38 items
CANISTER SUCT 3000ML PPV (MISCELLANEOUS) ×2 IMPLANT
CLIP VESOCCLUDE MED 24/CT (CLIP) ×2 IMPLANT
CLIP VESOCCLUDE SM WIDE 24/CT (CLIP) ×2 IMPLANT
COVER PROBE W GEL 5X96 (DRAPES) ×2 IMPLANT
DERMABOND ADVANCED (GAUZE/BANDAGES/DRESSINGS) ×1
DERMABOND ADVANCED .7 DNX12 (GAUZE/BANDAGES/DRESSINGS) ×1 IMPLANT
DRAIN CHANNEL 15F RND FF W/TCR (WOUND CARE) IMPLANT
DRESSING PREVENA PLUS CUSTOM (GAUZE/BANDAGES/DRESSINGS) ×1 IMPLANT
DRSG PREVENA PLUS CUSTOM (GAUZE/BANDAGES/DRESSINGS) ×2
ELECT BLADE 4.0 EZ CLEAN MEGAD (MISCELLANEOUS) ×2
ELECT REM PT RETURN 9FT ADLT (ELECTROSURGICAL) ×2
ELECTRODE BLDE 4.0 EZ CLN MEGD (MISCELLANEOUS) ×1 IMPLANT
ELECTRODE REM PT RTRN 9FT ADLT (ELECTROSURGICAL) ×1 IMPLANT
EVACUATOR SILICONE 100CC (DRAIN) IMPLANT
GLOVE BIOGEL PI IND STRL 7.5 (GLOVE) ×1 IMPLANT
GLOVE BIOGEL PI INDICATOR 7.5 (GLOVE) ×1
GLOVE SURG SS PI 7.5 STRL IVOR (GLOVE) ×2 IMPLANT
GOWN STRL REUS W/ TWL LRG LVL3 (GOWN DISPOSABLE) ×2 IMPLANT
GOWN STRL REUS W/ TWL XL LVL3 (GOWN DISPOSABLE) ×2 IMPLANT
GOWN STRL REUS W/TWL LRG LVL3 (GOWN DISPOSABLE) ×2
GOWN STRL REUS W/TWL XL LVL3 (GOWN DISPOSABLE) ×2
HEMOSTAT SNOW SURGICEL 2X4 (HEMOSTASIS) ×2 IMPLANT
KIT BASIN OR (CUSTOM PROCEDURE TRAY) ×2 IMPLANT
KIT TURNOVER KIT B (KITS) ×2 IMPLANT
NS IRRIG 1000ML POUR BTL (IV SOLUTION) ×4 IMPLANT
PACK PERIPHERAL VASCULAR (CUSTOM PROCEDURE TRAY) ×2 IMPLANT
PAD ARMBOARD 7.5X6 YLW CONV (MISCELLANEOUS) ×4 IMPLANT
SPONGE INTESTINAL PEANUT (DISPOSABLE) ×4 IMPLANT
SUT PROLENE 5 0 C 1 24 (SUTURE) ×12 IMPLANT
SUT PROLENE 6 0 BV (SUTURE) ×2 IMPLANT
SUT VIC AB 2-0 CT1 27 (SUTURE) ×1
SUT VIC AB 2-0 CT1 TAPERPNT 27 (SUTURE) ×1 IMPLANT
SUT VIC AB 3-0 SH 27 (SUTURE) ×1
SUT VIC AB 3-0 SH 27X BRD (SUTURE) ×1 IMPLANT
SUT VICRYL 4-0 PS2 18IN ABS (SUTURE) ×2 IMPLANT
TOWEL GREEN STERILE (TOWEL DISPOSABLE) ×2 IMPLANT
TRAY FOLEY MTR SLVR 16FR STAT (SET/KITS/TRAYS/PACK) ×2 IMPLANT
WATER STERILE IRR 1000ML POUR (IV SOLUTION) ×2 IMPLANT

## 2018-03-12 NOTE — Anesthesia Procedure Notes (Signed)
Procedure Name: Intubation Date/Time: 03/12/2018 4:29 PM Performed by: Teressa Lower., CRNA Pre-anesthesia Checklist: Patient identified, Emergency Drugs available, Suction available and Patient being monitored Patient Re-evaluated:Patient Re-evaluated prior to induction Oxygen Delivery Method: Circle system utilized Preoxygenation: Pre-oxygenation with 100% oxygen Induction Type: IV induction Ventilation: Mask ventilation without difficulty and Oral airway inserted - appropriate to patient size Laryngoscope Size: Mac and 3 Grade View: Grade I Tube type: Oral Tube size: 7.5 mm Number of attempts: 1 Airway Equipment and Method: Stylet and Oral airway Placement Confirmation: ETT inserted through vocal cords under direct vision,  positive ETCO2 and breath sounds checked- equal and bilateral Secured at: 21 cm Tube secured with: Tape Dental Injury: Teeth and Oropharynx as per pre-operative assessment

## 2018-03-12 NOTE — Transfer of Care (Signed)
Immediate Anesthesia Transfer of Care Note  Patient: Dennis [name]  Procedure(s) Performed: REPAIR OF FEMORAL ARTERY PSEUDO ANEURYSM (Right Groin) APPLICATION OF WOUND VAC (Right Groin)  Patient Location: PACU  Anesthesia Type:General  Level of Consciousness: awake, alert  and oriented  Airway & Oxygen Therapy: Patient Spontanous Breathing and Patient connected to face mask oxygen  Post-op Assessment: Report given to RN and Post -op Vital signs reviewed and stable  Post vital signs: Reviewed and stable  Last Vitals:  Vitals Value Taken Time  BP    Temp    Pulse 82 03/12/2018  5:51 PM  Resp 11 03/12/2018  5:51 PM  SpO2 100 % 03/12/2018  5:51 PM  Vitals shown include unvalidated device data.  Last Pain:  Vitals:   03/12/18 0730  TempSrc:   PainSc: 0-No pain      Patients Stated Pain Goal: 0 (03/12/18 0730)  Complications: No apparent anesthesia complications

## 2018-03-12 NOTE — Op Note (Signed)
    Patient name: Dennis ReamsGeorge Bell Jr MRN: [id number] DOB: 10-04-1945 Sex: male  03/12/2018 Pre-operative Diagnosis: Right femoral pseudoaneurysm Post-operative diagnosis:  Same Surgeon:  Durene CalWells Onofre Gains Assistants:  Aggie MoatsMatt Eveland Procedure:   #1: Primary repair of right femoral pseudoaneurysm   #2: Placement of Praveena incisional wound VAC Anesthesia: General Blood Loss: 200 cc Specimens: None  Findings: Primary repair of right femoral pseudoaneurysm using 5-0 Prolene suture.  Indications: The patient recently underwent lower extremity angiography via right femoral cannulation.  He developed a pseudoaneurysm which was painful and felt to not be a good candidate for ultrasound-guided compression or thrombin injection.  Therefore surgical intervention was recommended.  Procedure:  The patient was identified in the holding area and [facility]  The patient was then placed supine on the table. general anesthesia was administered.  The patient was prepped and draped in the usual sterile fashion.  A time out was called and antibiotics were administered.  Ultrasound was used to identify the common femoral artery for location purposes of the incision.  I made a longitudinal incision over the artery.  Cautery was used to divide the subcutaneous tissue and self-retaining retractors were placed.  I entered the pseudoaneurysm which had pulsatile bleeding.  Manual pressure was used for hemostasis.  I then dissected out the common femoral artery above and below the pseudoaneurysm.  I did not dissect out the superficial femoral or profundofemoral artery.  Once I had adequate exposure the common femoral artery was occluded with vascular clamps.  I then used two 5-0 Prolene sutures to close the arteriotomy site.  The clamps were then released and the area was hemostatic.  The wound was then irrigated.  I reapproximated the femoral sheath with 2-0 Vicryl.  The subtenons tissue was then closed with additional  layers of 3-0 Vicryl followed by 3-0 Vicryl on the skin.  A Praveena wound VAC was placed over the incision.  There were no immediate complications.   Disposition: To PACU stable   V. Durene CalWells Davey Limas, M.D. [facility] [place] Office: [phone] Pager:  [phone]

## 2018-03-12 NOTE — Progress Notes (Signed)
Inpatient Diabetes Program Recommendations  AACE/ADA: New Consensus Statement on Inpatient Glycemic Control (2015)  Target Ranges:  Prepandial:   less than 140 mg/dL      Peak postprandial:   less than 180 mg/dL (1-2 hours)      Critically ill patients:  140 - 180 mg/dL   Results for Dennis ReamsBELL JR, Jahni (MRN [id number]) as of 03/12/2018 10:01  Ref. Range 03/11/2018 06:20 03/11/2018 17:54 03/11/2018 21:03 03/12/2018 06:38  Glucose-Capillary Latest Ref Range: 70 - 99 mg/dL 308185 (H) 657267 (H) 846189 (H) 223 (H)   Review of Glycemic Control  Diabetes history: DM 2 Outpatient Diabetes medications: 70/30 50 units qam, 30 units qpm Current orders for Inpatient glycemic control: Novolog Sensitive Correction 0-9 units tid   Inpatient Diabetes Program Recommendations:    Glucose above inpatient range, 180-200's. Patient takes 70/30 at home with basal insulin component. Please consider low dose basal insulin Lantus 8 units Q24 hours while here.  Thanks,  Christena DeemShannon Juleon Narang RN, MSN, BC-ADM, [facility] Inpatient Diabetes Coordinator Team Pager [phone] (8a-5p)

## 2018-03-12 NOTE — Progress Notes (Signed)
   VASCULAR SURGERY ASSESSMENT & PLAN:   The patient was to be discharged yesterday and return tomorrow morning for repair of his right femoral artery pseudoaneurysm.  Apparently when his wife came to pick him off and he got up he began having some more pain in the right groin and therefore was kept.  His exam has not changed significantly.  I will see if potentially Dr. Myra GianottiBrabham could proceed with repair of his right femoral artery pseudoaneurysm today.  The schedule is fairly full and he is on call so this may not be possible.  If not he would have to stay in the hospital and I would do it first thing in the morning.  SUBJECTIVE:   Currently minimal pain in the right groin.  PHYSICAL EXAM:   Vitals:   03/12/18 0302 03/12/18 0400 03/12/18 0500 03/12/18 0600  BP: (!) 172/63 (!) 174/65 (!) 184/78 (!) 158/59  Pulse:      Resp: 18 13 14 14   Temp:    97.7 F (36.5 C)  TempSrc:    Oral  SpO2: 99%   99%  Weight:   238 lb 1.6 oz (108 kg)   Height:       No significant change in exam.  Pulsatile mass right groin.  LABS:   Lab Results  Component Value Date   WBC 6.7 03/12/2018   HGB 9.6 (L) 03/12/2018   HCT 30.7 (L) 03/12/2018   MCV 87.5 03/12/2018   PLT 147 (L) 03/12/2018   Lab Results  Component Value Date   CREATININE 1.98 (H) 03/12/2018   Lab Results  Component Value Date   INR 1.0 03/04/2018   CBG (last 3)  Recent Labs    03/11/18 1754 03/11/18 2103 03/12/18 0638  GLUCAP 267* 189* 223*    PROBLEM LIST:    Principal Problem:   PVD - bilat SFA disease - s/p multiple PTA Active Problems:   Essential hypertension   Hyperlipidemia   Type 2 diabetes mellitus with circulatory disorder (HCC)   Tobacco abuse   Hx of CABG   Claudication, class III (HCC)   PAD (peripheral artery disease) (HCC)   Ischemic cardiomyopathy   Chronic renal insufficiency   Pseudoaneurysm (HCC)   CURRENT MEDS:   . aspirin EC  81 mg Oral Daily  . atorvastatin  80 mg Oral QHS  .  carvedilol  12.5 mg Oral BID  . clopidogrel  75 mg Oral Daily  . famotidine  20 mg Oral BID  . ferrous sulfate  325 mg Oral TID WC  . hydrALAZINE  25 mg Oral Q8H  . insulin aspart  0-9 Units Subcutaneous TID WC  . isosorbide mononitrate  30 mg Oral Daily  . levothyroxine  25 mcg Oral QAC breakfast  . magnesium oxide  400 mg Oral Daily  . niacin  500 mg Oral QHS  . pantoprazole  40 mg Oral BID AC  . sacubitril-valsartan  1 tablet Oral BID  . sodium chloride flush  3 mL Intravenous Q12H    Waverly FerrariChristopher Haldon Carley Beeper: [phone] Office: [phone] 03/12/2018

## 2018-03-12 NOTE — Progress Notes (Signed)
Patient transferred to OR by OR staff in bed. Wife accompanying patient to short stay area prior to surgery.

## 2018-03-12 NOTE — Progress Notes (Addendum)
Progress Note  Patient Name: Dennis [name] Date of Encounter: 03/12/2018  Primary Cardiologist: Dr. Nanetta Batty, MD   Subjective   Pt feels well this AM. Reports pain in right groin to touch. Anticipating surgical repair, hopefully today>>if not, tomorrow. Denies chest pain, palpitations.   Inpatient Medications    Scheduled Meds: . aspirin EC  81 mg Oral Daily  . atorvastatin  80 mg Oral QHS  . carvedilol  12.5 mg Oral BID  . clopidogrel  75 mg Oral Daily  . famotidine  20 mg Oral BID  . ferrous sulfate  325 mg Oral TID WC  . hydrALAZINE  25 mg Oral Q8H  . insulin aspart  0-9 Units Subcutaneous TID WC  . isosorbide mononitrate  30 mg Oral Daily  . levothyroxine  25 mcg Oral QAC breakfast  . magnesium oxide  400 mg Oral Daily  . niacin  500 mg Oral QHS  . pantoprazole  40 mg Oral BID AC  . sacubitril-valsartan  1 tablet Oral BID  . sodium chloride flush  3 mL Intravenous Q12H   Continuous Infusions: . sodium chloride    . [START ON 03/13/2018]  ceFAZolin (ANCEF) IV     PRN Meds: sodium chloride, acetaminophen, hydrALAZINE, morphine injection, nitroGLYCERIN, ondansetron (ZOFRAN) IV, sodium chloride flush   Vital Signs    Vitals:   03/12/18 0400 03/12/18 0500 03/12/18 0600 03/12/18 0804  BP: (!) 174/65 (!) 184/78 (!) 158/59 137/63  Pulse:    66  Resp: 13 14 14 17   Temp:   97.7 F (36.5 C) 98 F (36.7 C)  TempSrc:   Oral   SpO2:   99% 94%  Weight:  238 lb 1.6 oz (108 kg)    Height:        Intake/Output Summary (Last 24 hours) at 03/12/2018 0836 Last data filed at 03/12/2018 0600 Gross per 24 hour  Intake 360 ml  Output 400 ml  Net -40 ml   Filed Weights   03/10/18 0502 03/11/18 0647 03/12/18 0500  Weight: 236 lb 9.6 oz (107.3 kg) 238 lb 1.6 oz (108 kg) 238 lb 1.6 oz (108 kg)    Physical Exam   General: Well developed, well nourished, NAD Skin: Warm, dry, intact  Head: Normocephalic, atraumatic, clear, moist mucus membranes. Neck: Negative for  carotid bruits. No JVD Lungs:Clear to ausculation bilaterally. No wheezes, rales, or rhonchi. Breathing is unlabored. Cardiovascular: RRR with S1 S2. No murmurs, rubs or gallops Abdomen: Soft, non-tender, non-distended with normoactive bowel sounds.  No obvious abdominal masses. MSK: Strength and tone appear normal for age. 5/5 in all extremities Extremities: No edema. No clubbing or cyanosis. DP/PT pulses 2+ bilaterally Neuro: Alert and oriented. No focal deficits. No facial asymmetry. MAE spontaneously. Psych: Responds to questions appropriately with normal affect.    Labs    Chemistry Recent Labs  Lab 03/10/18 [id number] 03/11/18 0238 03/12/18 0212  NA 138 138 136  K 4.9 4.9 4.7  CL 108 111 109  CO2 23 22 20*  GLUCOSE 217* 165* 199*  BUN 31* 27* 27*  CREATININE 1.82* 1.74* 1.98*  CALCIUM 8.8* 9.1 8.9  GFRNONAA 35* 37* 32*  GFRAA 41* 43* 37*  ANIONGAP 7 5 7      Hematology Recent Labs  Lab 03/09/18 1528 03/11/18 0238 03/11/18 1951 03/12/18 0212  WBC 7.1 6.6  --  6.7  RBC 3.92* 3.75*  --  3.51*  HGB 10.9* 10.3* 9.8* 9.6*  HCT 34.7* 32.9* 31.4* 30.7*  MCV 88.5 87.7  --  87.5  MCH 27.8 27.5  --  27.4  MCHC 31.4 31.3  --  31.3  RDW 13.8 13.6  --  13.8  PLT 175 157  --  147*    Cardiac EnzymesNo results for input(s): TROPONINI in the last 168 hours. No results for input(s): TROPIPOC in the last 168 hours.   BNPNo results for input(s): BNP, PROBNP in the last 168 hours.   DDimer No results for input(s): DDIMER in the last 168 hours.   Radiology    No results found.  Telemetry    03/12/18 NSR HR 77- Personally Reviewed  ECG    No new tracings as of 03/12/2018- Personally Reviewed  Cardiac Studies   Peripheral vascular catheterization 03/10/18:  1: Abdominal aortogram-widely patent 2: Right lower extremity- the right common and external iliac arteries are widely patent 3: Left lower extremity- the left common and external iliac artery widely patent. There  were 80% tandem lesions in the proximal left SFA, 50% mid and distal with one-vessel runoff via peroneal. The peroneal had a segmental 80% proximal stenosis  IMPRESSION:Dennis [name] has high-grade proximal left SFA stenosis which corresponds to the area of abnormality on duplex ultrasound and probably responsible for his decreased left ABI and symptoms of claudication. We will proceed with chocolate balloon angioplasty followed by drug-eluting balloon angioplasty.  Final Impression:Successful chocolate balloon angioplasty followed by drug-eluting balloon angioplasty and nitinol self-expanding stenting of a hemodynamically significant proximal left SFA stenosis in the setting of lifestyle limiting claudication with one-vessel runoff via a diseased peroneal artery. The patient did receive a total of 100 cc of contrast. He will be gently hydrated for the next 12 hours. His serum creatinine was 1.8. He will be discharged home in the morning and will get follow-up lower extremity arterial Doppler studies in our [facility] line office next week. I will see him back in 2 to 3 weeks thereafter. He left the lab in stable condition  Lower extremity vascular ultrasound, pseudoaneurysm 03/11/2018: Findings:  An area with well defined borders measuring 3.8 cm x 2.8 cm was visualized arising off of the Right CFA with ultrasound characteristics of a pseudoaneurysm. The neck measures approximately 0.4 cm wide and 1.1 cm long.  Patient Profile     72 y.o. male with a history of CAD s/p CABG x 4 by Dr. Laneta SimmersBartle in 2014, significant PAD (well outlined in Dr. Hazle CocaBerry's notes), DM2, ischemic cardiomyopathy with EF 35-40% and CKD III (baseline Cr 1.7-1.8) who [facility] for direct admit for pre-cath hydration, prior to planned PV angiogram w/ Dr. Allyson SabalBerry on 03/10/18.  Assessment & Plan    1. Right common femoral artery pseudoaneurysm: [facility]-Hospital course complicated by right groin procedural site pain with  subsequent vascular duplex ultrasound performed on 03/11/2018.  He was found to have a pseudoaneurysm at 3.8 cm X 2.8 cm arising off of the right common femoral artery not thought to be compressible.  -[facility]VS consulted for further management and thought that surgical intervention was best option.  Due to scheduling, patient was going to be discharged as planned on 03/11/2017 and return on 03/13/2018 for surgical intervention. -Before discharge, patient experienced increased right groin pain was found to have a femoral hematoma.  Discharge was canceled.  Patient was made NPO overnight for hopeful surgical intervention today.  If not, per VVS surgery will proceed on 03/13/2018 as previously planned  2.  History of PAD with recent stent placement: -Recent PV  stent placement to left SFA on 03/10/2018 secondary to life limiting lower extremity claudication -Unfortunately his hospital course was complicated by right groin procedural site pain with subsequent vascular duplex ultrasound performed on 03/11/2018 initial day of discharge. -See above  3.  HTN: -Elevated, 158/59, 184/78, 174/65 -Carvedilol 12.5 mg, Imdur 30 mg -Hydralazine 25 added yesterday evening>>> consider increasing carvedilol and hydralazine further if pressures continue to be elevated  4.  CAD s/p CABG 2014: -Stable, denies chest pain -Continue ASA 81, carvedilol 12.5  5.  Ischemic cardiomyopathy/chronic systolic HF: -Pt with LVEF 35 to 40% with diffuse hypokinesis and grade 1 DD per echocardiogram 09/25/2017>> followed by Dr. [name] -Continue carvedilol, Imdur, hydralazine -Consider increasing secondary to elevated pressures  6.  Acute on chronic stage III kidney disease: -Creatinine increased today. May be component of contrast nephropathy.  Recheck tomorrow morning.  Continue Entresto for now.  Signed, Georgie Chard NP-C [facility] Pager: [phone] 03/12/2018, 8:36 AM    For questions or updates, please contact   Please  consult [email or web address] for contact info under Cardiology/STEMI.  As above, patient seen and examined.  He denies chest pain or dyspnea this morning.  He does have right groin pain.  On examination he has a small right groin hematoma.  He also has a pseudoaneurysm by Doppler.  This will require surgical intervention.  Vascular surgery is following.  Otherwise continue present medications.  Blood pressure elevated and will increase hydralazine as needed.  Renal function slightly worse.  May be component of contrast nephropathy.  Recheck tomorrow morning.  Continue Entresto for now. Olga Millers, MD

## 2018-03-12 NOTE — Anesthesia Preprocedure Evaluation (Addendum)
Anesthesia Evaluation  Patient identified by MRN, date of birth, ID band Patient awake    Reviewed: Allergy & Precautions, H&P , NPO status , Patient's Chart, lab work & pertinent test results, reviewed documented beta blocker date and time   Airway Mallampati: I  TM Distance: >3 FB Neck ROM: Full    Dental  (+) Edentulous Upper, Edentulous Lower   Pulmonary former smoker,    breath sounds clear to auscultation       Cardiovascular hypertension, Pt. on home beta blockers + CAD, + CABG, + Peripheral Vascular Disease and +CHF   Rhythm:Regular Rate:Normal  '19 Carotid US - 1-39% b/l ICAS  '19 TTE - Moderate LVH. EF 35% to 40%. Diffuse hypokinesis. Grade 1 diastolic dysfunction. Trivial MR. LA moderately dilated. RA mildly dilated.   Neuro/Psych negative neurological ROS  negative psych ROS   GI/Hepatic Neg liver ROS, GERD  Medicated and Controlled,  Endo/Other  diabetes, Type 2, Insulin DependentHypothyroidism Obesity  Renal/GU Renal InsufficiencyRenal disease     Musculoskeletal  (+) Arthritis ,   Abdominal (+) + obese,   Peds  Hematology  (+) anemia ,   Anesthesia Other Findings   Reproductive/Obstetrics                           Anesthesia Physical  Anesthesia Plan  ASA: III  Anesthesia Plan: General   Post-op Pain Management:    Induction: Intravenous  PONV Risk Score and Plan: 3 and Treatment may vary due to age or medical condition, Ondansetron and Dexamethasone  Airway Management Planned: Oral ETT  Additional Equipment: Arterial line  Intra-op Plan:   Post-operative Plan: Extubation in OR  Informed Consent: I have reviewed the patients History and Physical, chart, labs and discussed the procedure including the risks, benefits and alternatives for the proposed anesthesia with the patient or authorized representative who has indicated his/her understanding and acceptance.    Dental advisory given  Plan Discussed with: CRNA and Anesthesiologist  Anesthesia Plan Comments:         Anesthesia Quick Evaluation

## 2018-03-13 ENCOUNTER — Encounter (HOSPITAL_COMMUNITY): Payer: Self-pay | Admitting: Surgery

## 2018-03-13 ENCOUNTER — Telehealth (HOSPITAL_COMMUNITY): Payer: Self-pay | Admitting: *Deleted

## 2018-03-13 ENCOUNTER — Telehealth: Payer: Self-pay | Admitting: Surgery

## 2018-03-13 DIAGNOSIS — Z7982 Long term (current) use of aspirin: Secondary | ICD-10-CM | POA: Diagnosis not present

## 2018-03-13 DIAGNOSIS — I2510 Atherosclerotic heart disease of native coronary artery without angina pectoris: Secondary | ICD-10-CM | POA: Diagnosis present

## 2018-03-13 DIAGNOSIS — T508X5A Adverse effect of diagnostic agents, initial encounter: Secondary | ICD-10-CM | POA: Diagnosis not present

## 2018-03-13 DIAGNOSIS — E1151 Type 2 diabetes mellitus with diabetic peripheral angiopathy without gangrene: Secondary | ICD-10-CM | POA: Diagnosis present

## 2018-03-13 DIAGNOSIS — M7981 Nontraumatic hematoma of soft tissue: Secondary | ICD-10-CM | POA: Diagnosis not present

## 2018-03-13 DIAGNOSIS — K219 Gastro-esophageal reflux disease without esophagitis: Secondary | ICD-10-CM | POA: Diagnosis present

## 2018-03-13 DIAGNOSIS — E785 Hyperlipidemia, unspecified: Secondary | ICD-10-CM | POA: Diagnosis present

## 2018-03-13 DIAGNOSIS — I739 Peripheral vascular disease, unspecified: Secondary | ICD-10-CM | POA: Diagnosis not present

## 2018-03-13 DIAGNOSIS — I724 Aneurysm of artery of lower extremity: Secondary | ICD-10-CM | POA: Diagnosis not present

## 2018-03-13 DIAGNOSIS — N141 Nephropathy induced by other drugs, medicaments and biological substances: Secondary | ICD-10-CM | POA: Diagnosis not present

## 2018-03-13 DIAGNOSIS — I70212 Atherosclerosis of native arteries of extremities with intermittent claudication, left leg: Secondary | ICD-10-CM | POA: Diagnosis present

## 2018-03-13 DIAGNOSIS — N183 Chronic kidney disease, stage 3 (moderate): Secondary | ICD-10-CM | POA: Diagnosis present

## 2018-03-13 DIAGNOSIS — Z7902 Long term (current) use of antithrombotics/antiplatelets: Secondary | ICD-10-CM | POA: Diagnosis not present

## 2018-03-13 DIAGNOSIS — Z79899 Other long term (current) drug therapy: Secondary | ICD-10-CM | POA: Diagnosis not present

## 2018-03-13 DIAGNOSIS — I255 Ischemic cardiomyopathy: Secondary | ICD-10-CM | POA: Diagnosis present

## 2018-03-13 DIAGNOSIS — I5022 Chronic systolic (congestive) heart failure: Secondary | ICD-10-CM | POA: Diagnosis present

## 2018-03-13 DIAGNOSIS — Z951 Presence of aortocoronary bypass graft: Secondary | ICD-10-CM | POA: Diagnosis not present

## 2018-03-13 DIAGNOSIS — I130 Hypertensive heart and chronic kidney disease with heart failure and stage 1 through stage 4 chronic kidney disease, or unspecified chronic kidney disease: Secondary | ICD-10-CM | POA: Diagnosis present

## 2018-03-13 DIAGNOSIS — E669 Obesity, unspecified: Secondary | ICD-10-CM | POA: Diagnosis present

## 2018-03-13 DIAGNOSIS — E1122 Type 2 diabetes mellitus with diabetic chronic kidney disease: Secondary | ICD-10-CM | POA: Diagnosis present

## 2018-03-13 DIAGNOSIS — Z87891 Personal history of nicotine dependence: Secondary | ICD-10-CM | POA: Diagnosis not present

## 2018-03-13 DIAGNOSIS — Z6834 Body mass index (BMI) 34.0-34.9, adult: Secondary | ICD-10-CM | POA: Diagnosis not present

## 2018-03-13 DIAGNOSIS — Z794 Long term (current) use of insulin: Secondary | ICD-10-CM | POA: Diagnosis not present

## 2018-03-13 DIAGNOSIS — R001 Bradycardia, unspecified: Secondary | ICD-10-CM | POA: Diagnosis not present

## 2018-03-13 LAB — BASIC METABOLIC PANEL
ANION GAP: 12 (ref 5–15)
BUN: 29 mg/dL — ABNORMAL HIGH (ref 8–23)
CHLORIDE: 106 mmol/L (ref 98–111)
CO2: 17 mmol/L — AB (ref 22–32)
Calcium: 8.9 mg/dL (ref 8.9–10.3)
Creatinine, Ser: 1.97 mg/dL — ABNORMAL HIGH (ref 0.61–1.24)
GFR calc non Af Amer: 32 mL/min — ABNORMAL LOW (ref >60)
GFR, EST AFRICAN AMERICAN: 37 mL/min — AB (ref >60)
Glucose, Bld: 295 mg/dL — ABNORMAL HIGH (ref 70–99)
Potassium: 5.1 mmol/L (ref 3.5–5.1)
Sodium: 135 mmol/L (ref 135–145)

## 2018-03-13 LAB — GLUCOSE, CAPILLARY
GLUCOSE-CAPILLARY: 263 mg/dL — AB (ref 70–99)
Glucose-Capillary: 301 mg/dL — ABNORMAL HIGH (ref 70–99)
Glucose-Capillary: 261 mg/dL — ABNORMAL HIGH (ref 70–99)

## 2018-03-13 MED ORDER — HYDRALAZINE HCL 50 MG PO TABS: 50.0000 mg | ORAL_TABLET | Freq: Three times a day (TID) | ORAL | 3 refills | Status: DC

## 2018-03-13 MED ORDER — HYDRALAZINE HCL 50 MG PO TABS: 50.0000 mg | ORAL_TABLET | Freq: Three times a day (TID) | ORAL | Status: DC

## 2018-03-13 MED ORDER — FUROSEMIDE 40 MG PO TABS: 40.0000 mg | ORAL_TABLET | Freq: Every day | ORAL | 4 refills | Status: DC

## 2018-03-13 MED ORDER — FUROSEMIDE 80 MG PO TABS
80.0000 mg | ORAL_TABLET | Freq: Once | ORAL | Status: AC
  Administered 2018-03-13: 80 mg via ORAL
  Filled 2018-03-13: qty 1

## 2018-03-13 NOTE — Telephone Encounter (Signed)
sch appt lvm 03/24/18 1pm p/o MD

## 2018-03-13 NOTE — Progress Notes (Signed)
Inpatient Diabetes Program Recommendations  AACE/ADA: New Consensus Statement on Inpatient Glycemic Control (2015)  Target Ranges:  Prepandial:   less than 140 mg/dL      Peak postprandial:   less than 180 mg/dL (1-2 hours)      Critically ill patients:  140 - 180 mg/dL   Lab Results  Component Value Date   GLUCAP 301 (H) 03/13/2018   HGBA1C 10.4 (H) 06/21/2014    Review of Glycemic ControlResults for Dennis ReamsBELL [name], Dennis [name] (MRN [id number]) as of 03/13/2018 11:28  Ref. Range 03/12/2018 12:04 03/12/2018 15:12 03/12/2018 18:09 03/12/2018 20:51 03/13/2018 06:18  Glucose-Capillary Latest Ref Range: 70 - 99 mg/dL 784229 (H) 696201 (H) 295160 (H) 221 (H) 301 (H)    Diabetes history: Type 2 DM  Outpatient Diabetes medications: Novolin 70/30  50 units in the AM and 30 units with supper Current orders for Inpatient glycemic control:  Novolog sensitive tid with meals  Inpatient Diabetes Program Recommendations:    Please consider restarting a portion of patient's home dose of 70/30 back while in the hospital. Consider Novolog 70/30 25 units in the AM and 15 units with supper.  Paged PA to discuss.   Thanks,  Beryl MeagerJenny Bernal Luhman, RN, BC-ADM Inpatient Diabetes Coordinator Pager [phone] (8a-5p)

## 2018-03-13 NOTE — Progress Notes (Signed)
Progress Note  Patient Name: Dennis [name] Date of Encounter: 03/13/2018  Primary Cardiologist: Dr. Nanetta BattyJonathan Berry, MD   Subjective   Confused last PM; no chest pain or dyspnea  Inpatient Medications    Scheduled Meds: . aspirin EC  81 mg Oral Daily  . atorvastatin  80 mg Oral QHS  . carvedilol  12.5 mg Oral BID  . clopidogrel  75 mg Oral Daily  . famotidine  20 mg Oral BID  . ferrous sulfate  325 mg Oral TID WC  . heparin injection (subcutaneous)  5,000 Units Subcutaneous Q8H  . hydrALAZINE  25 mg Oral Q8H  . insulin aspart  0-9 Units Subcutaneous TID WC  . isosorbide mononitrate  30 mg Oral Daily  . levothyroxine  25 mcg Oral QAC breakfast  . magnesium oxide  400 mg Oral Daily  . mupirocin ointment  1 application Nasal BID  . niacin  500 mg Oral QHS  . pantoprazole  40 mg Oral BID AC  . sacubitril-valsartan  1 tablet Oral BID  . sodium chloride flush  3 mL Intravenous Q12H   Continuous Infusions: . sodium chloride 0 mL (03/12/18 1215)  . sodium chloride Stopped (03/12/18 1455)  . sodium chloride 10 mL/hr at 03/13/18 0750   PRN Meds: sodium chloride, acetaminophen, hydrALAZINE, morphine injection, nitroGLYCERIN, ondansetron (ZOFRAN) IV, oxyCODONE-acetaminophen, sodium chloride flush   Vital Signs    Vitals:   03/12/18 2137 03/13/18 0128 03/13/18 0427 03/13/18 0822  BP: (!) 162/55 (!) 160/72 (!) 149/72 (!) 129/51  Pulse: 89 85 87 95  Resp: 20 18 18 17   Temp:  98.2 F (36.8 C) 98.6 F (37 C)   TempSrc:  Oral Oral   SpO2: 96% 98% 96% 100%  Weight:      Height:        Intake/Output Summary (Last 24 hours) at 03/13/2018 0908 Last data filed at 03/13/2018 0750 Gross per 24 hour  Intake 2011.06 ml  Output 675 ml  Net 1336.06 ml   Filed Weights   03/11/18 0647 03/12/18 0500 03/12/18 1531  Weight: 238 lb 1.6 oz (108 kg) 238 lb 1.6 oz (108 kg) 238 lb 1.6 oz (108 kg)    Physical Exam   General: Obese NAD Head: Normal Neck: supple Lungs:Mild exp  wheeze Cardiovascular: RRR Abdomen: soft, NT/ND Extremities: No edema. Right groin with drain in place Neuro: Grossly intact    Labs    Chemistry Recent Labs  Lab 03/10/18 0634 03/11/18 0238 03/12/18 0212  NA 138 138 136  K 4.9 4.9 4.7  CL 108 111 109  CO2 23 22 20*  GLUCOSE 217* 165* 199*  BUN 31* 27* 27*  CREATININE 1.82* 1.74* 1.98*  CALCIUM 8.8* 9.1 8.9  GFRNONAA 35* 37* 32*  GFRAA 41* 43* 37*  ANIONGAP 7 5 7      Hematology Recent Labs  Lab 03/09/18 1528 03/11/18 0238 03/11/18 1951 03/12/18 0212  WBC 7.1 6.6  --  6.7  RBC 3.92* 3.75*  --  3.51*  HGB 10.9* 10.3* 9.8* 9.6*  HCT 34.7* 32.9* 31.4* 30.7*  MCV 88.5 87.7  --  87.5  MCH 27.8 27.5  --  27.4  MCHC 31.4 31.3  --  31.3  RDW 13.8 13.6  --  13.8  PLT 175 157  --  147*   Telemetry    Sinus- Personally Reviewed  Cardiac Studies   Peripheral vascular catheterization 03/10/18:  1: Abdominal aortogram-widely patent 2: Right lower extremity- the right  common and external iliac arteries are widely patent 3: Left lower extremity- the left common and external iliac artery widely patent. There were 80% tandem lesions in the proximal left SFA, 50% mid and distal with one-vessel runoff via peroneal. The peroneal had a segmental 80% proximal stenosis  IMPRESSION:Dennis [name] has high-grade proximal left SFA stenosis which corresponds to the area of abnormality on duplex ultrasound and probably responsible for his decreased left ABI and symptoms of claudication. We will proceed with chocolate balloon angioplasty followed by drug-eluting balloon angioplasty.  Final Impression:Successful chocolate balloon angioplasty followed by drug-eluting balloon angioplasty and nitinol self-expanding stenting of a hemodynamically significant proximal left SFA stenosis in the setting of lifestyle limiting claudication with one-vessel runoff via a diseased peroneal artery. The patient did receive a total of 100 cc of contrast.  He will be gently hydrated for the next 12 hours. His serum creatinine was 1.8. He will be discharged home in the morning and will get follow-up lower extremity arterial Doppler studies in our [facility] line office next week. I will see him back in 2 to 3 weeks thereafter. He left the lab in stable condition  Lower extremity vascular ultrasound, pseudoaneurysm 03/11/2018: Findings:  An area with well defined borders measuring 3.8 cm x 2.8 cm was visualized arising off of the Right CFA with ultrasound characteristics of a pseudoaneurysm. The neck measures approximately 0.4 cm wide and 1.1 cm long.  Patient Profile     72 y.o. male with a history of CAD s/p CABG x 4 by Dr. Laneta Simmers in 2014, significant PAD (well outlined in Dr. Hazle Coca notes), DM2, ischemic cardiomyopathy with EF 35-40% and CKD III (baseline Cr 1.7-1.8) who presents to [facility] on [facility]18 for direct admit for pre-cath hydration, prior to planned PV angiogram w/ Dr. [name] on 03/10/18.  Patient had angioplasty and stenting of left SFA.  Post procedure had pseudoaneurysm which required surgical repair.  Assessment & Plan    1. Right common femoral artery pseudoaneurysm: -Status post repair.  He will need follow-up with vascular surgery following discharge.  2.  History of PAD with recent stent placement: -Continue aspirin, Plavix and statin.  3.  HTN: -Elevated, increase hydralazine to 50 mg p.o. 3 times daily.  4.  CAD s/p CABG 2014: -Continue aspirin and statin.  5.  Ischemic cardiomyopathy/chronic systolic HF: -Continue carvedilol, Entresto.  His diuretics were held prior to his procedure.  He appears mildly volume overloaded.  I will give Lasix 80 mg today.  We will then resume 40 mg daily at discharge.  6.  Acute on chronic stage III kidney disease: -We will recheck creatinine this morning. May have component of contrast nephropathy.  If renal function stable we will plan discharge today and follow-up with Dr. [name].  He  will also need follow-up with vascular surgery.  > 30 min PA and physician time D2  Signed, Olga Millers MD [facility] 03/13/2018, 9:08 AM    For questions or updates, please contact   Please consult [email or web address] for contact info under Cardiology/STEMI.

## 2018-03-13 NOTE — Progress Notes (Addendum)
  Progress Note    03/13/2018 7:38 AM 1 Day Post-Op  Subjective:  Confused last night; alert and oriented this morning   Vitals:   03/13/18 0128 03/13/18 0427  BP: (!) 160/72 (!) 149/72  Pulse: 85 87  Resp: 18 18  Temp: 98.2 F (36.8 C) 98.6 F (37 C)  SpO2: 98% 96%   Physical Exam: Lungs:  Non labored on RA Incisions:  R groin incision with vac in place and good seal Extremities:  RLE somewhat edematous; perfusing R foot Abdomen:  Soft; non tender Neurologic: A&O  CBC    Component Value Date/Time   WBC 6.7 03/12/2018 0212   RBC 3.51 (L) 03/12/2018 0212   HGB 9.6 (L) 03/12/2018 0212   HGB 11.5 (L) 03/04/2018 1354   HCT 30.7 (L) 03/12/2018 0212   HCT 36.3 (L) 03/04/2018 1354   PLT 147 (L) 03/12/2018 0212   PLT 211 03/04/2018 1354   MCV 87.5 03/12/2018 0212   MCV 87 03/04/2018 1354   MCH 27.4 03/12/2018 0212   MCHC 31.3 03/12/2018 0212   RDW 13.8 03/12/2018 0212   RDW 14.5 03/04/2018 1354   LYMPHSABS 1.5 08/02/2013 1134   MONOABS 0.7 08/02/2013 1134   EOSABS 0.2 08/02/2013 1134   BASOSABS 0.0 08/02/2013 1134    BMET    Component Value Date/Time   NA 136 03/12/2018 0212   NA 138 03/06/2018 0932   K 4.7 03/12/2018 0212   CL 109 03/12/2018 0212   CO2 20 (L) 03/12/2018 0212   GLUCOSE 199 (H) 03/12/2018 0212   BUN 27 (H) 03/12/2018 0212   BUN 37 (H) 03/06/2018 0932   CREATININE 1.98 (H) 03/12/2018 0212   CREATININE 1.16 06/15/2014 1413   CALCIUM 8.9 03/12/2018 0212   GFRNONAA 32 (L) 03/12/2018 0212   GFRNONAA 64 06/15/2014 1413   GFRAA 37 (L) 03/12/2018 0212   GFRAA 74 06/15/2014 1413    INR    Component Value Date/Time   INR 1.0 03/04/2018 1354     Intake/Output Summary (Last 24 hours) at 03/13/2018 0738 Last data filed at 03/13/2018 0428 Gross per 24 hour  Intake 1972.73 ml  Output 675 ml  Net 1297.73 ml     Assessment/Plan:  72 y.o. male is s/p repair of R CFA pseudoaneurysm 1 Day Post-Op    Continue Prevena vac for 7-10 days; patient  and wife instructed to remove this at home OOB today Ok for discharge from vascular surgery standpoint Office will call to arrange follow up   Dennis RutterMatthew Eveland, Dennis [name] [facility] [phone] 03/13/2018 7:38 AM  Looks good Vac in place, which he will go home with Dispo per primary team  Dennis [name]

## 2018-03-13 NOTE — Telephone Encounter (Signed)
Patient approved for [employer] for $1,000 towards Entresto from 12/11/17 through 03/11/19.

## 2018-03-13 NOTE — Discharge Summary (Signed)
Discharge Summary    Patient ID: Dennis [name],  MRN: [id number], DOB/AGE: [birth year] 73 y.o.  Admit date: 03/09/2018 Discharge date: 03/13/2018  Primary Care Provider: System, Pcp Not In Primary Cardiologist: Nanetta Batty, MD  Discharge Diagnoses    Principal Problem:   PVD - bilat SFA disease - s/p multiple PTA Active Problems:   Essential hypertension   Hyperlipidemia   Type 2 diabetes mellitus with circulatory disorder (HCC)   Tobacco abuse   Hx of CABG   Claudication, class III (HCC)   PAD (peripheral artery disease) (HCC)   Ischemic cardiomyopathy   Chronic renal insufficiency   Pseudoaneurysm (HCC)  Allergies Allergies  Allergen Reactions  . Other Other (See Comments)    Seasonal  Nasal congestion   . Chlorhexidine Rash   Diagnostic Studies/Procedures    Peripheral vascular catheterization 03/10/18:  1: Abdominal aortogram-widely patent 2: Right lower extremity- the right common and external iliac arteries are widely patent 3: Left lower extremity- the left common and external iliac artery widely patent.  There were 80% tandem lesions in the proximal left SFA, 50% mid and distal with one-vessel runoff via peroneal.  The peroneal had a segmental 80% proximal stenosis  IMPRESSION: Dennis [name] has high-grade proximal left SFA stenosis which corresponds to the area of abnormality on duplex ultrasound and probably responsible for his decreased left ABI and symptoms of claudication.  We will proceed with chocolate balloon angioplasty followed by drug-eluting balloon angioplasty.  Final Impression: Successful chocolate balloon angioplasty followed by drug-eluting balloon angioplasty and nitinol self-expanding stenting of a hemodynamically significant proximal left SFA stenosis in the setting of lifestyle limiting claudication with one-vessel runoff via a diseased peroneal artery.  The patient did receive a total of 100 cc of contrast.  He will be gently hydrated for  the next 12 hours.  His serum creatinine was 1.8.  He will be discharged home in the morning and will get follow-up lower extremity arterial Doppler studies in our [facility] line office next week.  I will see him back in 2 to 3 weeks thereafter.  He left the lab in stable condition.   Lower extremity vascular ultrasound, pseudoaneurysm 03/11/2018: Findings:  An area with well defined borders measuring 3.8 cm x 2.8 cm was visualized arising off of the Right CFA with ultrasound characteristics of a pseudoaneurysm. The neck measures approximately 0.4 cm wide and 1.1 cm long.  History of Present Illness     Dennis [name] is a 72 yo male (retired [employer]) with a history of CAD s/p CABG x 4 by Dr. Laneta Simmers in 2014, significant PAD (well outlined in Dr. Hazle Coca notes), DM2, ischemic cardiomyopathy with EF 35-40% and CKD III (baseline Cr 1.7-1.8) who presents to [facility] on 03/09/18 for direct admit for pre-cath hydration, prior to planned PV angiogram w/ Dr. [name] on 03/10/18.  Pt was admitted to [facility] on 03/09/18 for a planned elective PV angiogram with Dr. [name] on 03/10/18 secondary to recent lifestyle limiting LE claudication. Recent ABI's were reduced on the left at 0.74 and 0.86 on the right. His last BMET on 03/06/18 showed a creatinine of 2.15 (baseline of approximately 1.8). On day of admission, his creatinine was 1.96 and was scheduled for first case at 0730 with Dr. [name]. His Lasix and Entresto were held on day of admission secondary to renal function.   He was intermittently bradycardiac on admission, however has stated that his HR is always in the upper 40's. He denies  chest pain, syncope, or near syncope. No orthopnea, abdominal bloating. Claudication stable L>R. No ulcers present.   Hospital Course    On 03/10/18, Dennis [name] underwent a successful peripheral vascular balloon angioplasty with DES intervention of the left SFA described as high-grade proximal left SFA stenosis which corresponds to the  area of abnormality on duplex ultrasound and probably responsible for his decreased left ABI and symptoms of claudication without complication.   Per PV note, he received a total of 100 cc of contrast with initial recommendations to keep overnight to gently hydrate and recheck his renal function today, 03/11/18.  His creatinine on day of procedure was 1.82 with improvement to 1.74 on 03/12/2018.   He has been scheduled for follow-up lower extremity arterial Doppler studies in our [facility] office next week and to see Dr. [name] for follow up in 2 to 3 weeks thereafter. Appointments have been made.   Unfortunately, his hospital course was complicated by right groin procedural site pain in which vascular duplex ultrasound was performed on 03/11/2018.  He was found to have a pseudoaneurysm at 3.8 cm X 2.8 cm arising off of the right common femoral artery not thought to be compressible. Therefore, VVS was consulted for further management.  Per consult note, given the fairly short neck, he was not felt to be a good candidate for compression therapy nor a candidate for thrombin injection which would place him at high risk for thrombosis of the femoral artery.  VVS recommend surgical repair.  Unfortunately, the patient had a fairly large lunch which excludes him from surgical intervention 03/11/2018.  Vascular initially recommended discharging 03/11/2018 with instructions to return on Thursday, 03/13/2018 for surgical repair of pseudoaneurysm.  Doreatha Massed, Vascular PA informed RN that office will call patient with instructions for anticipated surgery on Thursday, 03/13/18. Spoke with VVS PA who states that the pt can continue Plavix therapy at this time.  On 03/11/2018 we were planning discharge as planned however, patient developed right groin hematoma prior to discharge.  His discharge was placed on hold and vascular surgery general cardiology was notified.  A FemoStop was placed for hematoma control with success.  He  was placed on bedrest overnight and made n.p.o. for possible surgical intervention on 03/12/2018.  He was started on p.o. hydralazine for better BP control.  On 03/12/2017 patient underwent a primary repair of right femoral pseudoaneurysm with placement of Praveena incisional wound VAC without complication. Per vascular note, continue Praveena VAC for 7 to 10 days; patient and wife instructed to remove this at home.  Their office will call to arrange follow-up.  Patient notified.  He has lower extremity Doppler ultrasound next week and follow-up with Dr. [name] has been arrange.    Other hospital problems: 1. Right common femoral artery pseudoaneurysm: -Status post repair. Vascular surgery office to call patient for follow-up appointment.  They have been instructed by vascular MD about wound VAC care per MD note  2.  History of PAD with recent stent placement: -Continue aspirin, Plavix and statin.  3.  HTN: -Elevated,  163/78 129/51 149/72 160/72  -Hydralazine was added and titrated to 50 mg TID -Resume other antihypertensives and follow closely -He may need to be referred to the hypertension clinic if pressures have not improved at follow-up  4.  CAD s/p CABG 2014: -Continue aspirin and statin.  5.  Ischemic cardiomyopathy/chronic systolic HF: -Continue carvedilol and Entresto. His diuretics were held prior to his procedure. He appeared to be mildly volume overloaded  on day of discharge and he was given Lasix 80 mg once and then to resume 40 mg daily at discharge  6.  Acute on chronic stage III kidney disease: -Creatinine, day of initial PV procedure 1.82 >1.74>1.98>1.97.  Likely a component of contrast nephropathy.  He will need BMET at follow-up appointment to trend.  Consultants: Vascular Vein Specialist   The patient was seen and examined by Dr. Jens Som who feels that he is stable and ready for discharge today, 03/13/18. Pt has no complaints at this time. Discussed site care. Pt  acknowledges understanding. Will attach instructions as well.   _____________  Discharge Vitals Blood pressure (!) 163/78, pulse 88, temperature 97.8 F (36.6 C), temperature source Oral, resp. rate 18, height 5' 9.02" (1.753 m), weight 238 lb 1.6 oz (108 kg), SpO2 100 %.  Filed Weights   03/11/18 0647 03/12/18 0500 03/12/18 1531  Weight: 238 lb 1.6 oz (108 kg) 238 lb 1.6 oz (108 kg) 238 lb 1.6 oz (108 kg)   Labs & Radiologic Studies    CBC Recent Labs    03/11/18 0238 03/11/18 1951 03/12/18 0212  WBC 6.6  --  6.7  HGB 10.3* 9.8* 9.6*  HCT 32.9* 31.4* 30.7*  MCV 87.7  --  87.5  PLT 157  --  147*   Basic Metabolic Panel Recent Labs    16/10/96 0212 03/13/18 0930  NA 136 135  K 4.7 5.1  CL 109 106  CO2 20* 17*  GLUCOSE 199* 295*  BUN 27* 29*  CREATININE 1.98* 1.97*  CALCIUM 8.9 8.9  _____________  Disposition   Pt is being discharged home today in good condition.  Follow-up Plans & Appointments    Follow-up Information    [facility] Follow up on 03/18/2018.   Specialty:  Cardiology Why:  Your doppler appointment will be on 03/18/18 at 1230pm. Please arrive at 1215pm.  Contact information: [address] [place] [address] [phone]       [name], MD Follow up on 03/28/2018.   Specialties:  Cardiology, Radiology Why:  Your appointment with Dr. [name] will be on 03/28/18 at 1030am. Please arrive at 1015am.  Contact information: [address] [place] [address] [phone]          Discharge Instructions    AMB Referral to Cardiac Rehabilitation - Phase II   Complete by:  As directed    Diagnosis:  Other   Call MD for:  difficulty breathing, headache or visual disturbances   Complete by:  As directed    Call MD for:  persistant dizziness or light-headedness   Complete by:  As directed    Call MD for:  redness, tenderness, or signs of infection (pain, swelling, redness, odor or  green/yellow discharge around incision site)   Complete by:  As directed    Call MD for:  redness, tenderness, or signs of infection (pain, swelling, redness, odor or green/yellow discharge around incision site)   Complete by:  As directed    Call MD for:  severe uncontrolled pain   Complete by:  As directed    Call MD for:  severe uncontrolled pain   Complete by:  As directed    Call MD for:  temperature >100.4   Complete by:  As directed    Call MD for:  temperature >100.4   Complete by:  As directed    Diet - low sodium heart healthy   Complete by:  As directed    Diet - low sodium heart healthy   Complete by:  As directed    Discharge instructions   Complete by:  As directed    You can continue your Plavix at this time. I spoke with surgical PA with vascular services who states that you can continue your Plavix without complicating surgery planned for Thursday, 03/13/2018.  Dr. Adele Dan office will contact you with specifics about your upcoming surgery.  No driving for until cleared by Cardiology or Vascular surgery. Keep procedure site clean & dry. If you notice increased pain, swelling, bleeding or pus, call/return!  You may shower, but no soaking baths/hot tubs/pools for 1 week.   If you notice any bleeding such as blood in stool, black tarry stools, blood in urine, nosebleeds or any other unusual bleeding, call your doctor immediately.  We have set you up with lower extremity dopplers at our [facility] office as well as a follow-up visit with Dr. [name].  These appointment times and dates are attached on your discharge instructions.   Discharge instructions   Complete by:  As directed    Vascular surgical team will call you for follow-up appointment.  You will have a lower extremity Doppler ultrasound at Dr. Hazle Coca office next week and a follow-up with him thereafter.  These appointments have been scheduled and are on your discharge instructions.  Please monitor your blood  pressure occasionally at home. Call your doctor if you tend to get readings of greater than 150 on the top number or 90 on the bottom number. We have adjusted some of your blood pressure medications and hope to see improvement in your readings.   It is EXTREMELY IMPORTANT that you follow up with your primary care doctor for your uncontrolled diabetes. Diabetes can easily lead to serious consequences such as heart attack, stroke, peripheral vascular disease including amputated limbs, kidney failure requiring 4-hour dialysis treatments 3 times a week, blindness, chronic pain from nerve damage, impotence, and early death. Please call today to make that appointment.   Increase activity slowly   Complete by:  As directed    Increase activity slowly   Complete by:  As directed       Discharge Medications   Allergies as of 03/13/2018      Reactions   Other Other (See Comments)   Seasonal  Nasal congestion    Chlorhexidine Rash      Medication List    TAKE these medications   acetaminophen 325 MG tablet Commonly known as:  TYLENOL Take 2 tablets (650 mg total) by mouth every 4 (four) hours as needed for headache or mild pain. Notes to patient:   As needed for pain    aspirin 81 MG tablet Take 81 mg by mouth daily. Notes to patient:  Prevents clotting in the stent and heart attack    atorvastatin 80 MG tablet Commonly known as:  LIPITOR Take 1 tablet (80 mg total) by mouth daily. What changed:  when to take this Notes to patient:  Lowers cholesterol    carvedilol 12.5 MG tablet Commonly known as:  COREG Take 12.5 mg by mouth 2 (two) times daily. Notes to patient:  Decreases work of the heart  Lowers blood pressure and heart rate   clopidogrel 75 MG tablet Commonly known as:  PLAVIX Take 75 mg by mouth daily. Notes to patient:  Prevents clotting in stent and heart attack   ENTRESTO 49-51 MG Generic drug:  sacubitril-valsartan Take 1 tablet by  mouth 2 (two) times daily. Notes  to patient:  Treats heart failure Lowers blood pressure    ferrous sulfate 325 (65 FE) MG EC tablet Take 325 mg by mouth 3 (three) times daily with meals. Notes to patient:  Iron supplement    furosemide 40 MG tablet Commonly known as:  LASIX Take 1 tablet (40 mg total) by mouth daily.   hydrALAZINE 50 MG tablet Commonly known as:  APRESOLINE Take 1 tablet (50 mg total) by mouth every 8 (eight) hours.   insulin NPH-regular Human (70-30) 100 UNIT/ML injection Commonly known as:  NOVOLIN 70/30 Inject 30-50 Units into the skin See admin instructions. INJECT 50 UNITS SUBCUTANEOUSLY IN THE MORNING & 30 UNITS SUBCUTANEOUSLY AT NIGHT Notes to patient:  Controls blood sugar   isosorbide mononitrate 30 MG 24 hr tablet Commonly known as:  IMDUR Take 30 mg by mouth daily. Notes to patient:  Lowers blood pressure Increases blood flow to the heart   levothyroxine 25 MCG tablet Commonly known as:  SYNTHROID, LEVOTHROID Take 25 mcg by mouth daily before breakfast. Notes to patient:  Thyroid    Magnesium 400 MG Tabs Take 400 mg by mouth 2 (two) times daily. Notes to patient:  Magnesium supplement    niacin 500 MG CR tablet Commonly known as:  NIASPAN Take 1 tablet (500 mg total) by mouth at bedtime. Notes to patient:  Lowers cholesterol    nitroGLYCERIN 0.4 MG SL tablet Commonly known as:  NITROSTAT Place 0.4 mg under the tongue every 5 (five) minutes as needed for chest pain. Notes to patient:  As needed for chest pain    pantoprazole 40 MG tablet Commonly known as:  PROTONIX Take 40 mg by mouth 2 (two) times daily before a meal. Notes to patient:  Prevents heartburn Treat acid reflux   ranitidine 150 MG tablet Commonly known as:  ZANTAC Take 150 mg by mouth 2 (two) times daily. Notes to patient:  Treat acid reflux       Outstanding Labs/Studies   BMET at follow up  VVS office to call patient with follow-up appointment.  They have been instructed on wound VAC care per  vascular MD.   Duration of Discharge Encounter   Greater than 30 minutes including physician time.  Signed, Daron OfferJill Jarquis Walker Sayyid Harewood, NP 03/13/2018, 1:42 PM

## 2018-03-13 NOTE — Progress Notes (Signed)
Pt's EKG leads were off, when went to check , Pt had unhooked himself from monitor, pulled his iv tubing and was bleeding from iv site. Pt was naked, confused and was getting ready to go to bathroom. Pt was oriented to self but not to time, place and situation. Blood was all over the floor, pt had ripped his skin off of iv site when he pulled it off. Wound vac dressing to right groin had leakage due to pulling, dressing reinforced. Pt had episode of vomiting, prn medication given.Condom cath was placed per NT. Reriented pt, bath given, helped back in bed. Bed alarm on. Will continue to monitor.

## 2018-03-13 NOTE — Anesthesia Postprocedure Evaluation (Signed)
Anesthesia Post Note  Patient: Dennis ReamsGeorge [name]  Procedure(s) Performed: REPAIR OF FEMORAL ARTERY PSEUDO ANEURYSM (Right Groin) APPLICATION OF WOUND VAC (Right Groin)     Patient location during evaluation: PACU Anesthesia Type: General Level of consciousness: awake and alert Pain management: pain level controlled Vital Signs Assessment: post-procedure vital signs reviewed and stable Respiratory status: spontaneous breathing, nonlabored ventilation and respiratory function stable Cardiovascular status: blood pressure returned to baseline and stable Postop Assessment: no apparent nausea or vomiting Anesthetic complications: no    Last Vitals:  Vitals:   03/13/18 0128 03/13/18 0427  BP: (!) 160/72 (!) 149/72  Pulse: 85 87  Resp: 18 18  Temp: 36.8 C 37 C  SpO2: 98% 96%    Last Pain:  Vitals:   03/13/18 0747  TempSrc:   PainSc: Asleep      LLE Sensation: Full sensation (03/13/18 0747)   RLE Sensation: Full sensation (03/13/18 0747)      Beryle Lathehomas E Brock

## 2018-03-14 ENCOUNTER — Other Ambulatory Visit: Payer: Self-pay | Admitting: Cardiovascular Disease

## 2018-03-14 DIAGNOSIS — I739 Peripheral vascular disease, unspecified: Secondary | ICD-10-CM

## 2018-03-18 ENCOUNTER — Ambulatory Visit (HOSPITAL_COMMUNITY)
Admit: 2018-03-18 | Discharge: 2018-03-18 | Disposition: A | Discharge status: Home / Self Care | Payer: Medicare HMO | Source: Ambulatory Visit | Attending: Cardiovascular Disease | Admitting: Cardiovascular Disease

## 2018-03-18 ENCOUNTER — Ambulatory Visit: Payer: Medicare HMO | Admitting: Physician Assistant

## 2018-03-18 ENCOUNTER — Encounter: Payer: Self-pay | Admitting: Physician Assistant

## 2018-03-18 VITALS — BP 120/57 | HR 51 | Ht 69.0 in | Wt 234.6 lb

## 2018-03-18 DIAGNOSIS — E7800 Pure hypercholesterolemia, unspecified: Secondary | ICD-10-CM | POA: Diagnosis not present

## 2018-03-18 DIAGNOSIS — E785 Hyperlipidemia, unspecified: Secondary | ICD-10-CM | POA: Diagnosis not present

## 2018-03-18 DIAGNOSIS — Z794 Long term (current) use of insulin: Secondary | ICD-10-CM | POA: Diagnosis not present

## 2018-03-18 DIAGNOSIS — Z955 Presence of coronary angioplasty implant and graft: Secondary | ICD-10-CM | POA: Insufficient documentation

## 2018-03-18 DIAGNOSIS — I739 Peripheral vascular disease, unspecified: Secondary | ICD-10-CM

## 2018-03-18 DIAGNOSIS — I729 Aneurysm of unspecified site: Secondary | ICD-10-CM

## 2018-03-18 DIAGNOSIS — I2510 Atherosclerotic heart disease of native coronary artery without angina pectoris: Secondary | ICD-10-CM | POA: Insufficient documentation

## 2018-03-18 DIAGNOSIS — Z87891 Personal history of nicotine dependence: Secondary | ICD-10-CM | POA: Diagnosis not present

## 2018-03-18 DIAGNOSIS — E1159 Type 2 diabetes mellitus with other circulatory complications: Secondary | ICD-10-CM

## 2018-03-18 DIAGNOSIS — I255 Ischemic cardiomyopathy: Secondary | ICD-10-CM | POA: Diagnosis not present

## 2018-03-18 DIAGNOSIS — I10 Essential (primary) hypertension: Secondary | ICD-10-CM

## 2018-03-18 DIAGNOSIS — E1151 Type 2 diabetes mellitus with diabetic peripheral angiopathy without gangrene: Secondary | ICD-10-CM | POA: Diagnosis not present

## 2018-03-18 DIAGNOSIS — Z720 Tobacco use: Secondary | ICD-10-CM | POA: Diagnosis not present

## 2018-03-18 MED ORDER — HYDRALAZINE HCL 25 MG PO TABS: 25.0000 mg | ORAL_TABLET | Freq: Three times a day (TID) | ORAL | Status: DC

## 2018-03-18 MED ORDER — NYSTATIN 100000 UNIT/GM EX POWD: Freq: Four times a day (QID) | CUTANEOUS | 0 refills | Status: DC

## 2018-03-18 MED ORDER — FUROSEMIDE 40 MG PO TABS: 60.0000 mg | ORAL_TABLET | Freq: Every day | ORAL | 2 refills | Status: DC

## 2018-03-18 NOTE — Patient Instructions (Addendum)
Medication Instructions:  DECREASE Hydralazine to 25mg  Take 1 tablet three times a day INCREASE Lasix to 60mg  (1.5 tablet of the 40mg  tab) in the am  Labwork: Your physician recommends that you return for lab work in: [employer]-CBC, BMET  Testing/Procedures: NONE   Follow-Up: FOLLOW UP AS SCHEDULED WITH DR Allyson SabalBERRY  Any Other Special Instructions Will Be Listed Below (If Applicable). PURCHASE A BLOOD PRESSURE CUFF TO CHECK YOUR BLOOD PRESSURE DAILY   If you need a refill on your cardiac medications before your next appointment, please call your pharmacy.
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
## 2018-03-19 ENCOUNTER — Telehealth (HOSPITAL_COMMUNITY): Payer: Self-pay

## 2018-03-19 ENCOUNTER — Telehealth: Payer: Self-pay | Admitting: Physician Assistant

## 2018-03-19 DIAGNOSIS — I255 Ischemic cardiomyopathy: Secondary | ICD-10-CM

## 2018-03-19 LAB — BASIC METABOLIC PANEL
BUN / CREAT RATIO: 13 (ref 10–24)
BUN: 29 mg/dL — AB (ref 8–27)
CO2: 23 mmol/L (ref 20–29)
CREATININE: 2.2 mg/dL — AB (ref 0.76–1.27)
Calcium: 9.3 mg/dL (ref 8.6–10.2)
Chloride: 103 mmol/L (ref 96–106)
GFR calc Af Amer: 33 mL/min/{1.73_m2} — ABNORMAL LOW (ref >59)
GFR, EST NON AFRICAN AMERICAN: 29 mL/min/{1.73_m2} — AB (ref >59)
Glucose: 303 mg/dL — ABNORMAL HIGH (ref 65–99)
Potassium: 5.9 mmol/L (ref 3.5–5.2)
Sodium: 138 mmol/L (ref 134–144)

## 2018-03-19 LAB — CBC
HEMATOCRIT: 31.8 % — AB (ref 37.5–51.0)
HEMOGLOBIN: 10 g/dL — AB (ref 13.0–17.7)
MCH: 27.8 pg (ref 26.6–33.0)
MCHC: 31.4 g/dL — AB (ref 31.5–35.7)
MCV: 88 fL (ref 79–97)
Platelets: 273 10*3/uL (ref 150–450)
RBC: 3.6 x10E6/uL — AB (ref 4.14–5.80)
RDW: 14.6 % (ref 12.3–15.4)
WBC: 8.6 10*3/uL (ref 3.4–10.8)

## 2018-03-19 MED ORDER — SODIUM POLYSTYRENE SULFONATE PO POWD: Freq: Once | ORAL | 0 refills | Status: AC

## 2018-03-19 NOTE — Telephone Encounter (Signed)
Creatinine and potassium labs were quite elevated from yesterday. I called to inform the Advanced Heart Failure clinic. They are bringing him in the office on Friday for repeat lab work. I instructed him to hold his entresto until further instructions. I also ordered one dose of kayexalate. He expressed understanding of the plan.

## 2018-03-19 NOTE — Telephone Encounter (Signed)
I called and spoke w/pt about repeat labs, he will come to our office on Fri 7/5 at 11 am, order placed.

## 2018-03-19 NOTE — Telephone Encounter (Signed)
Called patient to see if he is interested in the Cardiac Rehab Program - Patient stated he is not interested. Closed referral. °

## 2018-03-19 NOTE — Telephone Encounter (Signed)
Patients insurance is active and benefits verified through [employer] - $45.00 co-pay, no deductible, out of pocket amount of $4,200/$1,004.05 has been met, no co-insurance, and no pre-authorization is required. Passport/reference [phone]  Will contact patient to see if he is interested in the Cardiac Rehab Program. If interested, patient will be contacted for scheduling upon review by the RN Navigator.

## 2018-03-21 ENCOUNTER — Ambulatory Visit (HOSPITAL_COMMUNITY)
Admission: RE | Admit: 2018-03-21 | Discharge: 2018-03-21 | Disposition: A | Discharge status: Home / Self Care | Payer: Medicare HMO | Source: Ambulatory Visit | Attending: Internal Medicine | Admitting: Internal Medicine

## 2018-03-21 ENCOUNTER — Other Ambulatory Visit: Payer: Self-pay | Admitting: *Deleted

## 2018-03-21 DIAGNOSIS — I739 Peripheral vascular disease, unspecified: Secondary | ICD-10-CM

## 2018-03-21 DIAGNOSIS — I255 Ischemic cardiomyopathy: Secondary | ICD-10-CM | POA: Diagnosis not present

## 2018-03-21 LAB — BASIC METABOLIC PANEL
ANION GAP: 8 (ref 5–15)
BUN: 33 mg/dL — ABNORMAL HIGH (ref 8–23)
CO2: 26 mmol/L (ref 22–32)
Calcium: 9.4 mg/dL (ref 8.9–10.3)
Chloride: 105 mmol/L (ref 98–111)
Creatinine, Ser: 2.01 mg/dL — ABNORMAL HIGH (ref 0.61–1.24)
GFR calc non Af Amer: 31 mL/min — ABNORMAL LOW (ref >60)
GFR, EST AFRICAN AMERICAN: 36 mL/min — AB (ref >60)
Glucose, Bld: 291 mg/dL — ABNORMAL HIGH (ref 70–99)
POTASSIUM: 5.2 mmol/L — AB (ref 3.5–5.1)
Sodium: 139 mmol/L (ref 135–145)

## 2018-03-21 NOTE — Addendum Note (Signed)
Encounter addended by: Theresia BoughJeffries, Kailyn Dubie M, CMA on: 03/21/2018 10:59 AM  Actions taken: Medication long-term status modified, Order list changed

## 2018-03-24 ENCOUNTER — Encounter: Payer: Self-pay | Admitting: Surgery

## 2018-03-24 ENCOUNTER — Other Ambulatory Visit: Payer: Self-pay

## 2018-03-24 ENCOUNTER — Ambulatory Visit (INDEPENDENT_AMBULATORY_CARE_PROVIDER_SITE_OTHER): Payer: Self-pay | Admitting: Surgery

## 2018-03-24 VITALS — BP 106/54 | HR 44 | Temp 97.7°F | Resp 20 | Ht 69.0 in | Wt 233.1 lb

## 2018-03-24 DIAGNOSIS — I729 Aneurysm of unspecified site: Secondary | ICD-10-CM

## 2018-03-24 NOTE — Progress Notes (Signed)
POST OPERATIVE OFFICE NOTE    CC:  F/u for surgery  HPI:  This is a 72 y.o. male who  who underwent a left superficial femoral artery angioplasty and stenting yesterday by Dr. Nanetta BattyJonathan [name].  He was admitted overnight for observation and this morning was noted to have some pain in the right groin.  This prompted a duplex scan which showed a 3.8 cm x 2.8 cm pseudoaneurysm arising off of the right common femoral artery.  Because it was tender the vascular technicians did not feel that he was a good candidate for compression therapy.  Vascular surgery was consulted.  He was taken to the OR 03/12/2018 by Dr. Myra [name] for Primary repair of right femoral pseudoaneurysm and Placement of Praveena incisional wound VAC.   He is here today for a f/u visit.  He states he has calf pain after walking < 2min at a time, and his symptoms are not better since his surgery.  He will f/u with Dr. Allyson [name] for further work up next week.        Allergies  Allergen Reactions  . Other Other (See Comments)    Seasonal  Nasal congestion   . Chlorhexidine Rash    Current Outpatient Medications  Medication Sig Dispense Refill  . acetaminophen (TYLENOL) 325 MG tablet Take 2 tablets (650 mg total) by mouth every 4 (four) hours as needed for headache or mild pain.    Marland Kitchen. aspirin 81 MG tablet Take 81 mg by mouth daily.    Marland Kitchen. atorvastatin (LIPITOR) 80 MG tablet Take 1 tablet (80 mg total) by mouth daily. (Patient taking differently: Take 80 mg by mouth at bedtime. ) 90 tablet 3  . carvedilol (COREG) 12.5 MG tablet Take 12.5 mg by mouth 2 (two) times daily.     . clopidogrel (PLAVIX) 75 MG tablet Take 75 mg by mouth daily.    . furosemide (LASIX) 40 MG tablet Take 1.5 tablets (60 mg total) by mouth daily. 45 tablet 2  . hydrALAZINE (APRESOLINE) 25 MG tablet Take 1 tablet (25 mg total) by mouth every 8 (eight) hours.    . insulin NPH-regular Human (NOVOLIN 70/30) (70-30) 100 UNIT/ML injection Inject 30-50 Units into the skin  See admin instructions. INJECT 50 UNITS SUBCUTANEOUSLY IN THE MORNING & 30 UNITS SUBCUTANEOUSLY AT NIGHT    . isosorbide mononitrate (IMDUR) 30 MG 24 hr tablet Take 30 mg by mouth daily.     . Magnesium 400 MG TABS Take 400 mg by mouth 2 (two) times daily.    . nitroGLYCERIN (NITROSTAT) 0.4 MG SL tablet Place 0.4 mg under the tongue every 5 (five) minutes as needed for chest pain.     Marland Kitchen. nystatin (MYCOSTATIN/NYSTOP) powder Apply topically 4 (four) times daily. 15 g 0  . pantoprazole (PROTONIX) 40 MG tablet Take 40 mg by mouth 2 (two) times daily before a meal.     No current facility-administered medications for this visit.      ROS:  See HPI  Physical Exam:  Vitals:   03/24/18 1330  BP: (!) 106/54  Pulse: (!) 44  Resp: 20  Temp: 97.7 F (36.5 C)  SpO2: 98%    Incision:  Area with minimal incision separation.  Are moist.   Extremities:  Doppler PT/DP/Peroneal monphasic B Neuro: Sensation intact Abdomen:  + BS, soft  Pre procedure ABI's4/19/2019 ABI/TBIToday's ABIToday's TBIPrevious ABIPrevious TBI +-------+-----------+-----------+------------+------------+ Right 0.86    0.58    0.97    0.87     +-------+-----------+-----------+------------+------------+  Left  0.74    0.49    0.98    0.99     +-------+-----------+-----------+------------+------------+   Post procedure ABI's 03/18/2018 ABI/TBIToday's ABIToday's TBIPrevious ABIPrevious TBI +-------+-----------+-----------+------------+------------+ Right 0.88    0.54    0.86    0.58     +-------+-----------+-----------+------------+------------+ Left  0.73    0.62    0.74    0.49     +-------+-----------+-----------+------------+------------+  Assessment/Plan:  This is a 72 y.o. male who is s/p:Repair of right femoral artey s/p Hematoma/pseudoaneurysm post angiogram and stenting left SFA   I recommended cleaning the incisional area  well, drying it well and placing 4 x 4 guaze in the groin crease while at rest.  He will f/u with Dr. Allyson [name] later this week. F/U ABI's per Dr. Allyson [name] in the future.  He is on Aspirin and Plavix daily.      [name] PA-C [facility] [phone]  Clinic MD:  Dennis [name]  I agree with the above.  Follow up in 3  Weeks for a wound check.  WB

## 2018-03-28 ENCOUNTER — Encounter: Payer: Self-pay | Admitting: Cardiovascular Disease

## 2018-03-28 ENCOUNTER — Ambulatory Visit: Payer: Medicare HMO | Admitting: Cardiovascular Disease

## 2018-03-28 VITALS — BP 134/58 | HR 70 | Ht 69.0 in | Wt 234.0 lb

## 2018-03-28 DIAGNOSIS — I739 Peripheral vascular disease, unspecified: Secondary | ICD-10-CM

## 2018-03-28 NOTE — Assessment & Plan Note (Signed)
History of PAD status post multiple lower extremity interventions.  I recently angiogram him on 03/10/2018 revealing high-grade proximal left SFA stenosis which I performed chocolate balloon angioplasty followed by drug-eluting balloon angioplasty and nitinol self-expanding stenting.  His ABIs have not changed.  He only has one vessel runoff via peroneal.  He cannot tell clinical difference.  Unfortunately his procedure was comp gated by right common femoral pseudoaneurysm which underwent surgical correction by Dr. Edilia Boickson .  This is slowly healing.

## 2018-03-28 NOTE — Patient Instructions (Signed)
Medication Instructions:   NO CHANGE  Your physician recommends that you return for lab work in: 3 WEEKS  Testing/Procedures:  Your physician has requested that you have a lower extremity arterial duplex. This test is an ultrasound of the arteries in the legs or arms. It looks at arterial blood flow in the legs and arms. Allow one hour for Lower and Upper Arterial scans. There are no restrictions or special instructions DUE IN January   Follow-Up:  Your physician recommends that you schedule a follow-up appointment in: 1 MONTH WITH ANGELA DUKE  Your physician wants you to follow-up in: 6 MONTHS WITH ANGELA DUKE You will receive a reminder letter in the mail two months in advance. If you don't receive a letter, please call our office to schedule the follow-up appointment.    Your physician wants you to follow-up in: ONE YEAR WITH DR San MorelleBERRY You will receive a reminder letter in the mail two months in advance. If you don't receive a letter, please call our office to schedule the follow-up appointment.    If you need a refill on your cardiac medications before your next appointment, please call your pharmacy.

## 2018-03-28 NOTE — Progress Notes (Signed)
03/28/2018 Dennis [name]   Nov 11, 1945  [id number]  Primary Physician System, Pcp Not In Primary Cardiologist: Dennis Gess MD Dennis [name], Dennis [name], Dennis [name]  HPI:  Dennis [name] is a 72 y.o.  father of one,wholast saw him in the5/06/2018. He was referred by Dennis [name] at [facility] for evaluation of claudication and arterial Doppler studies which were obtained in our office 02/26/2018. His cardiovascular risk factors include type 2 diabetes, hypertension, and hyperlipidemia. He has a 50-100-pack-year history of tobacco abuse currently smoking one pack to 2 packs a day. There is no family history of heart disease. He's never had a heart attack or stroke. He does complain of dyspnea on exertion. He has had claudication for the last 2 years worse over the 3 months Prior to initially seen me which was now lifestyle limiting. Doppler studies in our office performed 04/07/13 revealed a right ABI of 0.63 and a left ABI of 0.70. He had high-grade SFA disease bilaterally as well as tibial disease.because of a positive Myoview stress test the patient first had a diagnostic coronary arteriogram. Which showed 3 vessel disease with moderate LV dysfunction. He'll underwent coronary bypass grafting x4 by Dr. Rexanne [name] on 05/12/13 with a sequential LIMA to the LAD and diagonal branch, vein to obtuse marginal branch and to the PDA. His postop course was uncomplicated. Following his coronary artery bypass graft surgery he underwent staged bilateral superficial femoral artery directional atherectomy with marked improvement in his claudication and arterial Doppler studies. This week he developed shortness of breath and was seen in an emergency room and diagnosed with congestive heart failure. He was treated with Lasix and referred back to [place] for further evaluation. He saw Dennis Medici PA-C in our office yesterday who did a 2-D echocardiogram today that revealed an EF of 20-25%. This is a finding now almost 3  months status post coronary revascularization. He wore a LifeVest for 3 months because of a ejection fraction of 20-25% with global hypokinesia. A Myoview stress test showed inferior scar. PMr. Dennis [name] by myself 08/25/13 with a high-grade stenosis within his PDA graft was stented. His ejection fraction improved to 40-45% negating the need for ICD therapy. . Bilateral lower extremity pain similar to this preintervention symptoms. Followup Dopplers confirmed recurrent disease.on 11/16/13 he underwent re\re angiography, TurboHAWK directional arthrectomy, PTA of distal right SFA with drug-eluting balloon. He had a excellent angiographic and clinical result. He had residual disease in the left left lower extremity lifestyle limiting claudication.on 12/07/13 he underwent directional atherectomy using block of his proximal left SFA. He does have residual 60% segmental disease in the mid and distal left SFA with one vessel runoff via the perineal. followup Doppler studies performed 12/11/13 revealed a right ABI 0.96 and a left ABI of 0.83. Since I saw him 6 months ago he's done relatively well has been stable from a heart point of view.  I performed bilateral SFA directional atherectomy with drug elutingballoon angioplastyresulting in improvement in his symptoms of claudication and his Doppler studies. His last Doppler performed in4/30/18 revealed normal ABIs and widely patent SFA. He has been briefly hospitalized because of hypotension for unclear reasons. His PCP has down titrated his carvedilol from 25 mg by mouth twice a day down to 6.25 mg by mouth twice a day. His most recent echo performed at [name] 10/03/16 revealed an EF of 35-40%. Since I saw him in the office 6 months ago he's  had several admissions for your visits over the last month for increasing dyspnea. He was told to double his carvedilol and his furosemide. Today the office is polys 41. He now  has class III symptoms at least but does not have peripheral edema. I'm worried that his EF has worsened and his renal failure has worsened as well. I referred him to Dr. Gala [name] at the advanced heart failure clinic for optimization of his heart failure from oncology. Dennis [name] was begun. Carvedilol was down titrated by his PCP because of relative hypotension. He has gained 6 pounds since I saw him back in January. His abdomen is protuberant. He does admit to dietary indiscretion with regards to salt. He also complains of bilateral calf claudication which is new. His most recent Doppler showed decline in his right ABI with a new high-frequency signal in his mid left SFAwith a decline in his left ABI to 0.74. His serum creatinine is 1.86. He does wish to proceed with peripheral angiography and endovascular therapy for lifestyle limiting claudication. He was recently admitted to [facility] with chest pain. He underwent cardiac [name] by Dr. Onalee Hua revealing no lesions that warranted PCI. His sequential LIMA to the LAD and diet was patent as was his vein graft to OM 2 although his vein to his RCA was occluded and appeared chronic. He was admitted the night prior for hydration.  His serum creatinine came down to 1.8.  Underwent peripheral angiography and intervention by myself on 03/10/2018.  He had chocolate balloon angioplasty, drug-eluting balloon angioplasty and nitinol self-expanding stenting of a high-grade segmental proximal left SFA stenosis.  He did have one-vessel runoff on the left via peroneal.  His ABIs did not change.  Unfortunately his procedure was complicated by right common femoral pseudoaneurysm that ultimately required surgical intervention by Dr. Edilia [name].  Current Meds  Medication Sig  . acetaminophen (TYLENOL) 325 MG tablet Take 2 tablets (650 mg total) by mouth every 4 (four) hours as needed for headache or mild pain.  Marland Kitchen aspirin 81 MG tablet Take 81  mg by mouth daily.  Marland Kitchen atorvastatin (LIPITOR) 80 MG tablet Take 1 tablet (80 mg total) by mouth daily. (Patient taking differently: Take 80 mg by mouth at bedtime. )  . carvedilol (COREG) 12.5 MG tablet Take 12.5 mg by mouth 2 (two) times daily.   . clopidogrel (PLAVIX) 75 MG tablet Take 75 mg by mouth daily.  . furosemide (LASIX) 40 MG tablet Take 1.5 tablets (60 mg total) by mouth daily.  . hydrALAZINE (APRESOLINE) 25 MG tablet Take 1 tablet (25 mg total) by mouth every 8 (eight) hours.  . insulin NPH-regular Human (NOVOLIN 70/30) (70-30) 100 UNIT/ML injection Inject 30-50 Units into the skin See admin instructions. INJECT 50 UNITS SUBCUTANEOUSLY IN THE MORNING & 30 UNITS SUBCUTANEOUSLY AT NIGHT  . isosorbide mononitrate (IMDUR) 30 MG 24 hr tablet Take 30 mg by mouth daily.   . Magnesium 400 MG TABS Take 400 mg by mouth 2 (two) times daily.  . nitroGLYCERIN (NITROSTAT) 0.4 MG SL tablet Place 0.4 mg under the tongue every 5 (five) minutes as needed for chest pain.   Marland Kitchen nystatin (MYCOSTATIN/NYSTOP) powder Apply topically 4 (four) times daily.  . pantoprazole (PROTONIX) 40 MG tablet Take 40 mg by mouth 2 (two) times daily before a meal.     Allergies  Allergen Reactions  . Other Other (See Comments)    Seasonal  Nasal congestion   . Chlorhexidine Rash  Social History   Socioeconomic History  . Marital status: Married    Spouse name: Not on file  . Number of children: Not on file  . Years of education: Not on file  . Highest education level: Not on file  Occupational History  . Not on file  Social Needs  . Financial resource strain: Not on file  . Food insecurity:    Worry: Not on file    Inability: Not on file  . Transportation needs:    Medical: Not on file    Non-medical: Not on file  Tobacco Use  . Smoking status: Former Smoker    Packs/day: 1.00    Years: 55.00    Pack years: 55.00    Types: Cigarettes    Last attempt to quit: 05/05/2013    Years since quitting: 4.8    . Smokeless tobacco: Never Used  . Tobacco comment: 12/07/2013 now using E- Cig.  Substance and Sexual Activity  . Alcohol use: No    Comment: 12/07/2013 "I'll have a beer once in a blue moon"  . Drug use: No  . Sexual activity: Not Currently  Lifestyle  . Physical activity:    Days per week: Not on file    Minutes per session: Not on file  . Stress: Not on file  Relationships  . Social connections:    Talks on phone: Not on file    Gets together: Not on file    Attends religious service: Not on file    Active member of club or organization: Not on file    Attends meetings of clubs or organizations: Not on file    Relationship status: Not on file  . Intimate partner violence:    Fear of current or ex partner: Not on file    Emotionally abused: Not on file    Physically abused: Not on file    Forced sexual activity: Not on file  Other Topics Concern  . Not on file  Social History Narrative  . Not on file     Review of Systems: General: negative for chills, fever, night sweats or weight changes.  Cardiovascular: negative for chest pain, dyspnea on exertion, edema, orthopnea, palpitations, paroxysmal nocturnal dyspnea or shortness of breath Dermatological: negative for rash Respiratory: negative for cough or wheezing Urologic: negative for hematuria Abdominal: negative for nausea, vomiting, diarrhea, bright red blood per rectum, melena, or hematemesis Neurologic: negative for visual changes, syncope, or dizziness All other systems reviewed and are otherwise negative except as noted above.    Blood pressure (!) 134/58, pulse 70, height 5\' 9"  (1.753 m), weight 234 lb (106.1 kg).  General appearance: alert and no distress Neck: no adenopathy, no carotid bruit, no JVD, supple, symmetrical, trachea midline and thyroid not enlarged, symmetric, no tenderness/mass/nodules Lungs: clear to auscultation bilaterally Heart: regular rate and rhythm, S1, S2 normal, no murmur, click, rub  or gallop Extremities: extremities normal, atraumatic, no cyanosis or edema and His right inguinal incision is slowly healing Pulses: Diminished pedal pulses bilaterally Skin: Skin color, texture, turgor normal. No rashes or lesions Neurologic: Alert and oriented X 3, normal strength and tone. Normal symmetric reflexes. Normal coordination and gait  EKG not performed today  ASSESSMENT AND PLAN:   PAD (peripheral artery disease) (HCC) History of PAD status post multiple lower extremity interventions.  I recently angiogram him on 03/10/2018 revealing high-grade proximal left SFA stenosis which I performed chocolate balloon angioplasty followed by drug-eluting balloon angioplasty and nitinol self-expanding stenting.  His ABIs have not changed.  He only has one vessel runoff via peroneal.  He cannot tell clinical difference.  Unfortunately his procedure was comp gated by right common femoral pseudoaneurysm which underwent surgical correction by Dr. Edilia Boickson .  This is slowly healing.      Dennis GessJonathan J. Brittan Mapel MD FACP,FACC,FAHA, [facility] 03/28/2018 11:09 AM

## 2018-04-15 ENCOUNTER — Encounter: Payer: Self-pay | Admitting: Physician Assistant

## 2018-04-15 ENCOUNTER — Ambulatory Visit (INDEPENDENT_AMBULATORY_CARE_PROVIDER_SITE_OTHER): Payer: Medicare HMO | Admitting: Physician Assistant

## 2018-04-15 VITALS — BP 145/63 | HR 47 | Temp 97.0°F | Resp 18 | Ht 69.5 in | Wt 233.0 lb

## 2018-04-15 DIAGNOSIS — I729 Aneurysm of unspecified site: Secondary | ICD-10-CM

## 2018-04-15 NOTE — Progress Notes (Signed)
    Postoperative Visit   History of Present Illness   Dennis ReamsGeorge [name] is a 72 y.o. year old male who presents for postoperative follow-up for: repair of R femoral pseudoaneurysm by Dr. Myra GianottiBrabham 03/12/18.  This was due to LLE arteriogram performed by Dr. Allyson SabalBerry.  The patient believes his groin incision is healing well.  He denies any drainage or continued pain.  He is follow up with Dr. Allyson SabalBerry next week to go over ABIs and discuss continued claudication.  He is taking aspirin and plavix daily.  He is a former smoker.     For VQI Use Only   PRE-ADM LIVING: Home  AMB STATUS: Ambulatory   Physical Examination   Vitals:   04/15/18 1351  BP: (!) 145/63  Pulse: (!) 47  Resp: 18  Temp: (!) 97 F (36.1 C)  SpO2: 97%  Weight: 233 lb (105.7 kg)  Height: 5' 9.5" (1.765 m)    RLE: R groin incision still with small open area in mid incision; no purulent drainage or obvious infection; no surrounding erythema   Medical Decision Making   Dennis ReamsGeorge [name] is a 72 y.o. year old male who presents s/p primary repair of R femoral pseudoaneurysm by Dr. Myra GianottiBrabham.   R groin incision healing well with small open area remaining in mid incision  Recheck wound in 3 weeks.  If incision has healed, patient instructed to cancel his appointment  Patient will follow up with Dr. Allyson SabalBerry next week to discuss continued claudication LLE  Emilie RutterMatthew Clarabelle Oscarson PA-C [facility] [place] Office: [phone]

## 2018-04-17 ENCOUNTER — Encounter (HOSPITAL_COMMUNITY): Payer: Self-pay

## 2018-04-17 ENCOUNTER — Ambulatory Visit (HOSPITAL_COMMUNITY)
Admission: RE | Admit: 2018-04-17 | Discharge: 2018-04-17 | Disposition: A | Discharge status: Home / Self Care | Payer: Medicare HMO | Source: Ambulatory Visit | Attending: Internal Medicine | Admitting: Internal Medicine

## 2018-04-17 VITALS — BP 138/72 | HR 48 | Wt 233.8 lb

## 2018-04-17 DIAGNOSIS — K219 Gastro-esophageal reflux disease without esophagitis: Secondary | ICD-10-CM | POA: Insufficient documentation

## 2018-04-17 DIAGNOSIS — I10 Essential (primary) hypertension: Secondary | ICD-10-CM | POA: Diagnosis not present

## 2018-04-17 DIAGNOSIS — I130 Hypertensive heart and chronic kidney disease with heart failure and stage 1 through stage 4 chronic kidney disease, or unspecified chronic kidney disease: Secondary | ICD-10-CM | POA: Insufficient documentation

## 2018-04-17 DIAGNOSIS — I255 Ischemic cardiomyopathy: Secondary | ICD-10-CM | POA: Diagnosis not present

## 2018-04-17 DIAGNOSIS — Z794 Long term (current) use of insulin: Secondary | ICD-10-CM | POA: Insufficient documentation

## 2018-04-17 DIAGNOSIS — Z7982 Long term (current) use of aspirin: Secondary | ICD-10-CM | POA: Insufficient documentation

## 2018-04-17 DIAGNOSIS — N183 Chronic kidney disease, stage 3 unspecified: Secondary | ICD-10-CM

## 2018-04-17 DIAGNOSIS — Z79899 Other long term (current) drug therapy: Secondary | ICD-10-CM | POA: Diagnosis not present

## 2018-04-17 DIAGNOSIS — I5022 Chronic systolic (congestive) heart failure: Secondary | ICD-10-CM | POA: Diagnosis not present

## 2018-04-17 DIAGNOSIS — Z951 Presence of aortocoronary bypass graft: Secondary | ICD-10-CM | POA: Diagnosis not present

## 2018-04-17 DIAGNOSIS — I2510 Atherosclerotic heart disease of native coronary artery without angina pectoris: Secondary | ICD-10-CM | POA: Diagnosis not present

## 2018-04-17 DIAGNOSIS — M79605 Pain in left leg: Secondary | ICD-10-CM | POA: Insufficient documentation

## 2018-04-17 DIAGNOSIS — I739 Peripheral vascular disease, unspecified: Secondary | ICD-10-CM

## 2018-04-17 DIAGNOSIS — E1122 Type 2 diabetes mellitus with diabetic chronic kidney disease: Secondary | ICD-10-CM | POA: Insufficient documentation

## 2018-04-17 DIAGNOSIS — E785 Hyperlipidemia, unspecified: Secondary | ICD-10-CM | POA: Insufficient documentation

## 2018-04-17 DIAGNOSIS — E1151 Type 2 diabetes mellitus with diabetic peripheral angiopathy without gangrene: Secondary | ICD-10-CM | POA: Insufficient documentation

## 2018-04-17 DIAGNOSIS — I252 Old myocardial infarction: Secondary | ICD-10-CM | POA: Insufficient documentation

## 2018-04-17 DIAGNOSIS — E669 Obesity, unspecified: Secondary | ICD-10-CM | POA: Diagnosis not present

## 2018-04-17 DIAGNOSIS — R5383 Other fatigue: Secondary | ICD-10-CM | POA: Insufficient documentation

## 2018-04-17 DIAGNOSIS — Z87891 Personal history of nicotine dependence: Secondary | ICD-10-CM | POA: Diagnosis not present

## 2018-04-17 DIAGNOSIS — Z7902 Long term (current) use of antithrombotics/antiplatelets: Secondary | ICD-10-CM | POA: Insufficient documentation

## 2018-04-17 DIAGNOSIS — E875 Hyperkalemia: Secondary | ICD-10-CM | POA: Diagnosis not present

## 2018-04-17 LAB — BASIC METABOLIC PANEL
Anion gap: 8 (ref 5–15)
BUN: 28 mg/dL — ABNORMAL HIGH (ref 8–23)
CALCIUM: 9.2 mg/dL (ref 8.9–10.3)
CHLORIDE: 107 mmol/L (ref 98–111)
CO2: 26 mmol/L (ref 22–32)
CREATININE: 1.97 mg/dL — AB (ref 0.61–1.24)
GFR calc Af Amer: 37 mL/min — ABNORMAL LOW (ref >60)
GFR calc non Af Amer: 32 mL/min — ABNORMAL LOW (ref >60)
GLUCOSE: 138 mg/dL — AB (ref 70–99)
Potassium: 4.6 mmol/L (ref 3.5–5.1)
Sodium: 141 mmol/L (ref 135–145)

## 2018-04-17 MED ORDER — HYDRALAZINE HCL 25 MG PO TABS: 25.0000 mg | ORAL_TABLET | Freq: Three times a day (TID) | ORAL | 3 refills | Status: DC
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
## 2018-04-17 NOTE — Patient Instructions (Signed)
Labs today (will call for abnormal results, otherwise no news is good news)  INCREASE Hydralazine to 25 mg (1 Tablet) Three Times Daily.  Follow up with Dr. Gala RomneyBensimhon in 2-3 Months.

## 2018-04-23 ENCOUNTER — Encounter: Payer: Self-pay | Admitting: *Deleted

## 2018-04-23 ENCOUNTER — Encounter: Payer: Self-pay | Admitting: Physician Assistant

## 2018-04-23 ENCOUNTER — Ambulatory Visit: Payer: Medicare HMO | Admitting: Physician Assistant

## 2018-04-23 VITALS — BP 129/74 | HR 69 | Ht 69.5 in | Wt 237.0 lb

## 2018-04-23 DIAGNOSIS — I5042 Chronic combined systolic (congestive) and diastolic (congestive) heart failure: Secondary | ICD-10-CM

## 2018-04-23 DIAGNOSIS — I255 Ischemic cardiomyopathy: Secondary | ICD-10-CM | POA: Diagnosis not present

## 2018-04-23 DIAGNOSIS — I739 Peripheral vascular disease, unspecified: Secondary | ICD-10-CM | POA: Diagnosis not present

## 2018-04-23 DIAGNOSIS — I25700 Atherosclerosis of coronary artery bypass graft(s), unspecified, with unstable angina pectoris: Secondary | ICD-10-CM

## 2018-04-23 DIAGNOSIS — I2510 Atherosclerotic heart disease of native coronary artery without angina pectoris: Secondary | ICD-10-CM | POA: Insufficient documentation

## 2018-04-23 NOTE — Patient Instructions (Addendum)
Medication Instructions:  Your physician recommends that you continue on your current medications as directed. Please refer to the Current Medication list given to you today.  Follow-Up: Your physician wants you to follow-up in: 6 months with Dr. Allyson SabalBerry.  You will receive a reminder letter in the mail two months in advance. If you don't receive a letter, please call our office to schedule the follow-up appointment.  ----Follow up sooner with heart failure clinic if you have to start taking extra furosemide (Lasix) consistently.  Any Other Special Instructions Will Be Listed Below (If Applicable).     If you need a refill on your cardiac medications before your next appointment, please call your pharmacy.
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
## 2018-04-27 ENCOUNTER — Encounter (HOSPITAL_COMMUNITY): Payer: Self-pay

## 2018-05-05 ENCOUNTER — Ambulatory Visit: Payer: Medicare HMO

## 2018-05-05 ENCOUNTER — Telehealth (HOSPITAL_COMMUNITY): Payer: Self-pay | Admitting: *Deleted

## 2018-05-05 NOTE — Telephone Encounter (Signed)
Patient called to let us know that he was [facility] over the weekend [facility] for increased shortness of breath, volume overloaded.  Patient said he would call us once he is discharged for us to schedule a post hospital follow up.

## 2018-05-06 ENCOUNTER — Telehealth: Payer: Self-pay | Admitting: *Deleted

## 2018-05-06 NOTE — Telephone Encounter (Signed)
Patient was [facility] for [facility] study. I screened patient for study. LDL is 82 but total cholesterol is 156. Total cholesterol has to be >160. I called patient to let him know he does not qualify for study but appreciated his interest in the study.

## 2018-05-07 ENCOUNTER — Ambulatory Visit: Payer: Medicare HMO

## 2018-05-14 ENCOUNTER — Other Ambulatory Visit: Payer: Self-pay

## 2018-05-14 ENCOUNTER — Ambulatory Visit (INDEPENDENT_AMBULATORY_CARE_PROVIDER_SITE_OTHER): Payer: Self-pay | Admitting: Physician Assistant

## 2018-05-14 VITALS — BP 142/66 | HR 49 | Temp 97.7°F | Resp 16 | Ht 69.5 in | Wt 232.0 lb

## 2018-05-14 DIAGNOSIS — I729 Aneurysm of unspecified site: Secondary | ICD-10-CM

## 2018-05-14 DIAGNOSIS — I739 Peripheral vascular disease, unspecified: Secondary | ICD-10-CM

## 2018-05-14 DIAGNOSIS — I70212 Atherosclerosis of native arteries of extremities with intermittent claudication, left leg: Secondary | ICD-10-CM

## 2018-05-14 NOTE — Progress Notes (Signed)
POST OPERATIVE OFFICE NOTE    CC:  F/u for surgery  HPI:  This is a 72 y.o. male who is s/p repair of right femoral PSA by Dr. [name] on 03/12/18 following arteriogram by Dr. [name].  He underwent balloon angioplasty and drug eluding balloon angioplasty with nitinol self expanding stent in the proximal left SFA.   1: Abdominal aortogram-widely patent 2: Right lower extremity- the right common and external iliac arteries are widely patent 3: Left lower extremity- the left common and external iliac artery widely patent.  There were 80% tandem lesions in the proximal left SFA, 50% mid and distal with one-vessel runoff via peroneal.  The peroneal had a segmental 80% proximal stenosis  He was seen back on 04/15/18 and his incision was healing well with a small open area in the mid portion of the incision.  He returns today for follow up.  He states he is still having pain in his left leg and it is very painful to walk in from the parking lot to our office.  He states his pain has worsened since his procedure.  He would like to switch over to Dr. [name] for further care.  He denies any non healing wounds on his feet.  He wants his leg pain improved so that he can continue with his activities of going on a cruise, [employer] and even [employer] as he says that is too far to walk as it is painful.    He takes a daily asa/statin.  He is on insulin for diabetes.  He takes plavix.    Allergies  Allergen Reactions  . Other Other (See Comments)    Seasonal  Nasal congestion   . Chlorhexidine Rash    Current Outpatient Medications  Medication Sig Dispense Refill  . acetaminophen (TYLENOL) 325 MG tablet Take 2 tablets (650 mg total) by mouth every 4 (four) hours as needed for headache or mild pain.    Marland Kitchen aspirin 81 MG tablet Take 81 mg by mouth daily.    Marland Kitchen atorvastatin (LIPITOR) 80 MG tablet Take 1 tablet (80 mg total) by mouth daily. (Patient taking differently: Take 80 mg by mouth at bedtime. ) 90  tablet 3  . carvedilol (COREG) 12.5 MG tablet Take 12.5 mg by mouth 2 (two) times daily.     . clopidogrel (PLAVIX) 75 MG tablet Take 75 mg by mouth daily.    . furosemide (LASIX) 40 MG tablet Take 40 mg by mouth 2 (two) times daily.    . hydrALAZINE (APRESOLINE) 25 MG tablet Take 1 tablet (25 mg total) by mouth 3 (three) times daily. 90 tablet 3  . insulin NPH-regular Human (NOVOLIN 70/30) (70-30) 100 UNIT/ML injection Inject 30-50 Units into the skin See admin instructions. INJECT 50 UNITS SUBCUTANEOUSLY IN THE MORNING & 30 UNITS SUBCUTANEOUSLY AT NIGHT    . isosorbide mononitrate (IMDUR) 30 MG 24 hr tablet Take 30 mg by mouth daily.     . Magnesium 400 MG TABS Take 400 mg by mouth 2 (two) times daily.    . nitroGLYCERIN (NITROSTAT) 0.4 MG SL tablet Place 0.4 mg under the tongue every 5 (five) minutes as needed for chest pain.     . pantoprazole (PROTONIX) 40 MG tablet Take 40 mg by mouth 2 (two) times daily before a meal.     No current facility-administered medications for this visit.      ROS:  See HPI  Physical Exam:  Vitals:   05/14/18 1303  BP: (!) 142/66  Pulse: (!) 49  Resp: 16  Temp: 97.7 F (36.5 C)  SpO2: 100%   Vitals:   05/14/18 1303  Weight: 232 lb (105.2 kg)  Height: 5' 9.5" (1.765 m)   Body mass index is 33.77 kg/m.  Incision:  Right groin wound has healed.  There is a small amount of yeast present. Extremities:  Brisk right monophasic peroneal and monophasic DP/PT; left with faint peroneal and monophasic PT; unable to doppler DP   ABI's on 03/18/18 (previous abi on 01/03/18 ABI/TBIToday's ABIToday's TBIPrevious ABIPrevious TBI +-------+-----------+-----------+------------+------------+ Right 0.88    0.54    0.86    0.58     +-------+-----------+-----------+------------+------------+ Left  0.73    0.62    0.74    0.49     +-------+-----------+-----------+------------+------------+  Vascular duplex  LLE: Left: 30-49% stenosis noted in the common femoral artery. 30-49% disease without focal stenosis noted in the proximal superficial femoral artery and >50% in-stent restenosis (low end of range). Moderate improvement is noted compared to previous study.   Assessment/Plan:  This is a 72 y.o. male who is s/p: Procedure:   #1: Primary repair of right femoral pseudoaneurysm                         #2: Placement of Praveena incisional wound VAC  -pt's incision has healed.  He did have a small amount of yeast present and discussed care for this-he has treated this before.  He knows to keep the groin dry. -he does have worsening claudication in the left leg since his procedure.  He did have ABI's and an arterial duplex afterward and his ABI's were essentially unchanged and arterial duplex showed a stenosis of the proximal SFA stent 50-99% on the lower end of the range.  The pt is really wants to correct his claudication as it is interfering with his life activities.  I discussed bringing him back to our office for repeat ABI's and a LLE arterial duplex and an appointment with MD to evaluate him and possible intervention.  He is in agreement with this.  I discussed with him having 1st available surgeon and he is also in agreement with this.  -he knows that if he develops any non healing wounds, he will call sooner to be seen.     Doreatha MassedSamantha Florence Yeung, PA-C [facility] [phone]  Clinic MD:  Darrick PennaFields

## 2018-05-15 ENCOUNTER — Telehealth: Payer: Self-pay | Admitting: Physician Assistant

## 2018-05-15 NOTE — Telephone Encounter (Signed)
Per Samantha's instructions I rescheduled patient to see Dr.Clark on 05/20/18 instead of PA. I did not contact patient due to appt times are same. awt

## 2018-05-15 NOTE — Telephone Encounter (Signed)
-----   Message from Dara LordsSamantha J Rhyne, [place]PA-C sent at 05/15/2018  8:53 AM EDT ----- I saw this pt yesterday and he needs 1st available MD not PA  The pt was okay with 1st available MD.  It would not benefit the pt to be seen with PA again.    Thanks

## 2018-05-20 ENCOUNTER — Other Ambulatory Visit: Payer: Self-pay

## 2018-05-20 ENCOUNTER — Ambulatory Visit (HOSPITAL_COMMUNITY)
Admission: RE | Admit: 2018-05-20 | Discharge: 2018-05-20 | Disposition: A | Discharge status: Home / Self Care | Payer: Medicare HMO | Source: Ambulatory Visit | Attending: Vascular Surgery | Admitting: Vascular Surgery

## 2018-05-20 ENCOUNTER — Ambulatory Visit: Payer: Medicare HMO | Admitting: Vascular Surgery

## 2018-05-20 ENCOUNTER — Ambulatory Visit (INDEPENDENT_AMBULATORY_CARE_PROVIDER_SITE_OTHER)
Admission: RE | Admit: 2018-05-20 | Discharge: 2018-05-20 | Disposition: A | Discharge status: Home / Self Care | Payer: Medicare HMO | Source: Ambulatory Visit | Attending: Vascular Surgery | Admitting: Vascular Surgery

## 2018-05-20 ENCOUNTER — Encounter: Payer: Self-pay | Admitting: Vascular Surgery

## 2018-05-20 VITALS — BP 124/61 | HR 49 | Temp 97.0°F | Resp 24 | Ht 69.5 in | Wt 234.0 lb

## 2018-05-20 DIAGNOSIS — I729 Aneurysm of unspecified site: Secondary | ICD-10-CM | POA: Diagnosis present

## 2018-05-20 DIAGNOSIS — I739 Peripheral vascular disease, unspecified: Secondary | ICD-10-CM | POA: Diagnosis not present

## 2018-05-20 DIAGNOSIS — I70202 Unspecified atherosclerosis of native arteries of extremities, left leg: Secondary | ICD-10-CM | POA: Insufficient documentation
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
## 2018-05-21 ENCOUNTER — Other Ambulatory Visit: Payer: Self-pay

## 2018-05-21 DIAGNOSIS — I739 Peripheral vascular disease, unspecified: Secondary | ICD-10-CM

## 2018-06-10 ENCOUNTER — Other Ambulatory Visit: Payer: Self-pay | Admitting: *Deleted

## 2018-06-10 DIAGNOSIS — I739 Peripheral vascular disease, unspecified: Secondary | ICD-10-CM

## 2018-06-10 DIAGNOSIS — R0989 Other specified symptoms and signs involving the circulatory and respiratory systems: Secondary | ICD-10-CM

## 2018-07-02 ENCOUNTER — Ambulatory Visit (HOSPITAL_COMMUNITY)
Admission: RE | Admit: 2018-07-02 | Discharge: 2018-07-02 | Disposition: A | Discharge status: Home / Self Care | Payer: Medicare HMO | Source: Ambulatory Visit | Attending: Internal Medicine | Admitting: Internal Medicine

## 2018-07-02 ENCOUNTER — Encounter (HOSPITAL_COMMUNITY): Payer: Self-pay | Admitting: Internal Medicine

## 2018-07-02 VITALS — BP 133/71 | HR 99 | Wt 229.0 lb

## 2018-07-02 DIAGNOSIS — E1122 Type 2 diabetes mellitus with diabetic chronic kidney disease: Secondary | ICD-10-CM | POA: Diagnosis not present

## 2018-07-02 DIAGNOSIS — R350 Frequency of micturition: Secondary | ICD-10-CM | POA: Diagnosis not present

## 2018-07-02 DIAGNOSIS — I5042 Chronic combined systolic (congestive) and diastolic (congestive) heart failure: Secondary | ICD-10-CM | POA: Diagnosis not present

## 2018-07-02 DIAGNOSIS — Z7902 Long term (current) use of antithrombotics/antiplatelets: Secondary | ICD-10-CM | POA: Insufficient documentation

## 2018-07-02 DIAGNOSIS — N183 Chronic kidney disease, stage 3 (moderate): Secondary | ICD-10-CM | POA: Insufficient documentation

## 2018-07-02 DIAGNOSIS — E875 Hyperkalemia: Secondary | ICD-10-CM | POA: Insufficient documentation

## 2018-07-02 DIAGNOSIS — I5022 Chronic systolic (congestive) heart failure: Secondary | ICD-10-CM | POA: Diagnosis not present

## 2018-07-02 DIAGNOSIS — E785 Hyperlipidemia, unspecified: Secondary | ICD-10-CM | POA: Insufficient documentation

## 2018-07-02 DIAGNOSIS — Z87891 Personal history of nicotine dependence: Secondary | ICD-10-CM | POA: Diagnosis not present

## 2018-07-02 DIAGNOSIS — I255 Ischemic cardiomyopathy: Secondary | ICD-10-CM | POA: Diagnosis not present

## 2018-07-02 DIAGNOSIS — I724 Aneurysm of artery of lower extremity: Secondary | ICD-10-CM | POA: Insufficient documentation

## 2018-07-02 DIAGNOSIS — Z79899 Other long term (current) drug therapy: Secondary | ICD-10-CM | POA: Diagnosis not present

## 2018-07-02 DIAGNOSIS — Z794 Long term (current) use of insulin: Secondary | ICD-10-CM | POA: Insufficient documentation

## 2018-07-02 DIAGNOSIS — I739 Peripheral vascular disease, unspecified: Secondary | ICD-10-CM | POA: Diagnosis not present

## 2018-07-02 DIAGNOSIS — E669 Obesity, unspecified: Secondary | ICD-10-CM | POA: Diagnosis not present

## 2018-07-02 DIAGNOSIS — Z951 Presence of aortocoronary bypass graft: Secondary | ICD-10-CM | POA: Diagnosis not present

## 2018-07-02 DIAGNOSIS — I130 Hypertensive heart and chronic kidney disease with heart failure and stage 1 through stage 4 chronic kidney disease, or unspecified chronic kidney disease: Secondary | ICD-10-CM | POA: Insufficient documentation

## 2018-07-02 DIAGNOSIS — Z7982 Long term (current) use of aspirin: Secondary | ICD-10-CM | POA: Diagnosis not present

## 2018-07-02 DIAGNOSIS — I252 Old myocardial infarction: Secondary | ICD-10-CM | POA: Insufficient documentation

## 2018-07-02 DIAGNOSIS — E1151 Type 2 diabetes mellitus with diabetic peripheral angiopathy without gangrene: Secondary | ICD-10-CM | POA: Insufficient documentation

## 2018-07-02 DIAGNOSIS — I2510 Atherosclerotic heart disease of native coronary artery without angina pectoris: Secondary | ICD-10-CM | POA: Diagnosis not present

## 2018-07-02 DIAGNOSIS — M1990 Unspecified osteoarthritis, unspecified site: Secondary | ICD-10-CM | POA: Insufficient documentation

## 2018-07-02 DIAGNOSIS — K219 Gastro-esophageal reflux disease without esophagitis: Secondary | ICD-10-CM | POA: Diagnosis not present

## 2018-07-02 LAB — BASIC METABOLIC PANEL
ANION GAP: 9 (ref 5–15)
BUN: 35 mg/dL — ABNORMAL HIGH (ref 8–23)
CALCIUM: 9.5 mg/dL (ref 8.9–10.3)
CHLORIDE: 101 mmol/L (ref 98–111)
CO2: 23 mmol/L (ref 22–32)
CREATININE: 2.06 mg/dL — AB (ref 0.61–1.24)
GFR calc non Af Amer: 30 mL/min — ABNORMAL LOW (ref >60)
GFR, EST AFRICAN AMERICAN: 35 mL/min — AB (ref >60)
Glucose, Bld: 346 mg/dL — ABNORMAL HIGH (ref 70–99)
Potassium: 6.3 mmol/L (ref 3.5–5.1)
SODIUM: 133 mmol/L — AB (ref 135–145)

## 2018-07-02 MED ORDER — ATORVASTATIN CALCIUM 80 MG PO TABS: 80.0000 mg | ORAL_TABLET | Freq: Every day | ORAL | Status: DC

## 2018-07-02 MED ORDER — FUROSEMIDE 40 MG PO TABS: 40.0000 mg | ORAL_TABLET | Freq: Every day | ORAL | 6 refills | Status: DC

## 2018-07-02 MED ORDER — TAMSULOSIN HCL 0.4 MG PO CAPS: 0.4000 mg | ORAL_CAPSULE | Freq: Every day | ORAL | 6 refills | Status: DC

## 2018-07-02 MED ORDER — HYDRALAZINE HCL 25 MG PO TABS: 25.0000 mg | ORAL_TABLET | Freq: Three times a day (TID) | ORAL | 3 refills | Status: DC

## 2018-07-02 MED ORDER — CARVEDILOL 6.25 MG PO TABS: 6.2500 mg | ORAL_TABLET | Freq: Two times a day (BID) | ORAL | 6 refills | Status: DC

## 2018-07-02 NOTE — Patient Instructions (Addendum)
Restart Furosemide 40 mg daily  Restart Carvedilol 6.25 mg Twice daily   Start Flomax 0.4 mg Take every evening with dinner  Labs today  Your physician recommends that you schedule a follow-up appointment in: 3 months
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
## 2018-07-03 ENCOUNTER — Ambulatory Visit (HOSPITAL_COMMUNITY)
Admission: RE | Admit: 2018-07-03 | Discharge: 2018-07-03 | Disposition: A | Discharge status: Home / Self Care | Payer: Medicare HMO | Source: Ambulatory Visit | Attending: Cardiology | Admitting: Cardiology

## 2018-07-03 DIAGNOSIS — N183 Chronic kidney disease, stage 3 unspecified: Secondary | ICD-10-CM

## 2018-07-03 LAB — BASIC METABOLIC PANEL
Anion gap: 11 (ref 5–15)
BUN: 27 mg/dL — AB (ref 8–23)
CALCIUM: 9.7 mg/dL (ref 8.9–10.3)
CO2: 23 mmol/L (ref 22–32)
Chloride: 101 mmol/L (ref 98–111)
Creatinine, Ser: 1.98 mg/dL — ABNORMAL HIGH (ref 0.61–1.24)
GFR calc Af Amer: 37 mL/min — ABNORMAL LOW (ref >60)
GFR, EST NON AFRICAN AMERICAN: 32 mL/min — AB (ref >60)
GLUCOSE: 387 mg/dL — AB (ref 70–99)
POTASSIUM: 5.6 mmol/L — AB (ref 3.5–5.1)
SODIUM: 135 mmol/L (ref 135–145)

## 2018-08-19 ENCOUNTER — Ambulatory Visit: Payer: Medicare HMO | Admitting: Vascular Surgery

## 2018-08-19 ENCOUNTER — Encounter: Payer: Self-pay | Admitting: Vascular Surgery

## 2018-08-19 ENCOUNTER — Ambulatory Visit (HOSPITAL_COMMUNITY)
Admission: RE | Admit: 2018-08-19 | Discharge: 2018-08-19 | Disposition: A | Discharge status: Home / Self Care | Payer: Medicare HMO | Source: Ambulatory Visit | Attending: Vascular Surgery | Admitting: Vascular Surgery

## 2018-08-19 ENCOUNTER — Other Ambulatory Visit: Payer: Self-pay

## 2018-08-19 VITALS — BP 130/69 | HR 55 | Resp 18 | Ht 69.5 in | Wt 229.1 lb

## 2018-08-19 DIAGNOSIS — I739 Peripheral vascular disease, unspecified: Secondary | ICD-10-CM | POA: Diagnosis present
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
## 2018-09-02 ENCOUNTER — Other Ambulatory Visit: Payer: Self-pay | Admitting: Family

## 2018-09-02 ENCOUNTER — Other Ambulatory Visit (HOSPITAL_COMMUNITY): Payer: Self-pay | Admitting: Family

## 2018-09-02 ENCOUNTER — Ambulatory Visit: Payer: Medicare HMO | Admitting: Family

## 2018-09-02 ENCOUNTER — Ambulatory Visit (HOSPITAL_COMMUNITY)
Admission: RE | Admit: 2018-09-02 | Discharge: 2018-09-02 | Disposition: A | Discharge status: Home / Self Care | Payer: Medicare HMO | Source: Ambulatory Visit | Attending: Family | Admitting: Family

## 2018-09-02 ENCOUNTER — Encounter: Payer: Self-pay | Admitting: Family

## 2018-09-02 VITALS — BP 124/68 | HR 54 | Temp 97.0°F | Resp 16 | Ht 69.5 in | Wt 227.0 lb

## 2018-09-02 DIAGNOSIS — I6523 Occlusion and stenosis of bilateral carotid arteries: Secondary | ICD-10-CM | POA: Diagnosis not present

## 2018-09-02 DIAGNOSIS — M79662 Pain in left lower leg: Principal | ICD-10-CM

## 2018-09-02 DIAGNOSIS — M79661 Pain in right lower leg: Secondary | ICD-10-CM

## 2018-09-02 DIAGNOSIS — I739 Peripheral vascular disease, unspecified: Secondary | ICD-10-CM

## 2018-09-02 DIAGNOSIS — R0609 Other forms of dyspnea: Secondary | ICD-10-CM | POA: Diagnosis not present
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
## 2018-09-24 ENCOUNTER — Encounter (HOSPITAL_COMMUNITY): Payer: Medicare HMO

## 2018-10-02 ENCOUNTER — Telehealth (HOSPITAL_COMMUNITY): Payer: Self-pay | Admitting: Cardiology

## 2018-10-02 ENCOUNTER — Other Ambulatory Visit: Payer: Self-pay

## 2018-10-02 ENCOUNTER — Ambulatory Visit (HOSPITAL_COMMUNITY)
Admission: RE | Admit: 2018-10-02 | Discharge: 2018-10-02 | Disposition: A | Discharge status: Home / Self Care | Payer: Medicare HMO | Source: Ambulatory Visit | Attending: Internal Medicine | Admitting: Internal Medicine

## 2018-10-02 VITALS — BP 136/78 | HR 60 | Wt 233.4 lb

## 2018-10-02 DIAGNOSIS — N183 Chronic kidney disease, stage 3 unspecified: Secondary | ICD-10-CM

## 2018-10-02 DIAGNOSIS — I252 Old myocardial infarction: Secondary | ICD-10-CM | POA: Diagnosis not present

## 2018-10-02 DIAGNOSIS — Z7982 Long term (current) use of aspirin: Secondary | ICD-10-CM | POA: Diagnosis not present

## 2018-10-02 DIAGNOSIS — E669 Obesity, unspecified: Secondary | ICD-10-CM | POA: Insufficient documentation

## 2018-10-02 DIAGNOSIS — I5042 Chronic combined systolic (congestive) and diastolic (congestive) heart failure: Secondary | ICD-10-CM

## 2018-10-02 DIAGNOSIS — I739 Peripheral vascular disease, unspecified: Secondary | ICD-10-CM

## 2018-10-02 DIAGNOSIS — I255 Ischemic cardiomyopathy: Secondary | ICD-10-CM

## 2018-10-02 DIAGNOSIS — Z87891 Personal history of nicotine dependence: Secondary | ICD-10-CM | POA: Diagnosis not present

## 2018-10-02 DIAGNOSIS — Z955 Presence of coronary angioplasty implant and graft: Secondary | ICD-10-CM | POA: Diagnosis not present

## 2018-10-02 DIAGNOSIS — K219 Gastro-esophageal reflux disease without esophagitis: Secondary | ICD-10-CM | POA: Insufficient documentation

## 2018-10-02 DIAGNOSIS — E785 Hyperlipidemia, unspecified: Secondary | ICD-10-CM | POA: Insufficient documentation

## 2018-10-02 DIAGNOSIS — Z7902 Long term (current) use of antithrombotics/antiplatelets: Secondary | ICD-10-CM | POA: Insufficient documentation

## 2018-10-02 DIAGNOSIS — Z794 Long term (current) use of insulin: Secondary | ICD-10-CM | POA: Diagnosis not present

## 2018-10-02 DIAGNOSIS — Z951 Presence of aortocoronary bypass graft: Secondary | ICD-10-CM | POA: Diagnosis not present

## 2018-10-02 DIAGNOSIS — M1990 Unspecified osteoarthritis, unspecified site: Secondary | ICD-10-CM | POA: Insufficient documentation

## 2018-10-02 DIAGNOSIS — I509 Heart failure, unspecified: Secondary | ICD-10-CM | POA: Diagnosis not present

## 2018-10-02 DIAGNOSIS — I10 Essential (primary) hypertension: Secondary | ICD-10-CM

## 2018-10-02 DIAGNOSIS — I2510 Atherosclerotic heart disease of native coronary artery without angina pectoris: Secondary | ICD-10-CM | POA: Diagnosis not present

## 2018-10-02 DIAGNOSIS — I5022 Chronic systolic (congestive) heart failure: Secondary | ICD-10-CM | POA: Insufficient documentation

## 2018-10-02 DIAGNOSIS — E875 Hyperkalemia: Secondary | ICD-10-CM | POA: Diagnosis not present

## 2018-10-02 DIAGNOSIS — Z79899 Other long term (current) drug therapy: Secondary | ICD-10-CM | POA: Diagnosis not present

## 2018-10-02 DIAGNOSIS — I130 Hypertensive heart and chronic kidney disease with heart failure and stage 1 through stage 4 chronic kidney disease, or unspecified chronic kidney disease: Secondary | ICD-10-CM | POA: Insufficient documentation

## 2018-10-02 DIAGNOSIS — R350 Frequency of micturition: Secondary | ICD-10-CM | POA: Diagnosis not present

## 2018-10-02 DIAGNOSIS — E1122 Type 2 diabetes mellitus with diabetic chronic kidney disease: Secondary | ICD-10-CM | POA: Diagnosis not present

## 2018-10-02 DIAGNOSIS — E1151 Type 2 diabetes mellitus with diabetic peripheral angiopathy without gangrene: Secondary | ICD-10-CM | POA: Insufficient documentation

## 2018-10-02 LAB — BASIC METABOLIC PANEL
ANION GAP: 9 (ref 5–15)
BUN: 34 mg/dL — ABNORMAL HIGH (ref 8–23)
CHLORIDE: 107 mmol/L (ref 98–111)
CO2: 21 mmol/L — AB (ref 22–32)
Calcium: 9.2 mg/dL (ref 8.9–10.3)
Creatinine, Ser: 2.25 mg/dL — ABNORMAL HIGH (ref 0.61–1.24)
GFR, EST AFRICAN AMERICAN: 32 mL/min — AB (ref >60)
GFR, EST NON AFRICAN AMERICAN: 28 mL/min — AB (ref >60)
Glucose, Bld: 256 mg/dL — ABNORMAL HIGH (ref 70–99)
Potassium: 5.4 mmol/L — ABNORMAL HIGH (ref 3.5–5.1)
Sodium: 137 mmol/L (ref 135–145)

## 2018-10-02 MED ORDER — HYDRALAZINE HCL 25 MG PO TABS: 25.0000 mg | ORAL_TABLET | Freq: Three times a day (TID) | ORAL | 3 refills | Status: DC

## 2018-10-02 MED ORDER — HYDRALAZINE HCL 25 MG PO TABS: 25.0000 mg | ORAL_TABLET | Freq: Three times a day (TID) | ORAL | 0 refills | Status: DC

## 2018-10-02 NOTE — Telephone Encounter (Signed)
-----   Message from Sherald Hess, NP sent at 10/02/2018  3:53 PM EST ----- Please call and instruct to stop potassium. Eliminate high potassium foods. BMET in 7 days.

## 2018-10-02 NOTE — Telephone Encounter (Signed)
Notes recorded by Theresia Bough, CMA on 10/02/2018 at 4:03 PM EST Patient aware. Patient voiced understanding-repeat lab 1/23  ------  Notes recorded by Tonye Becket D, NP on 10/02/2018 at 3:53 PM EST Please call and instruct to stop potassium. Eliminate high potassium foods. BMET in 7 days.
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
## 2018-10-02 NOTE — Patient Instructions (Addendum)
RESTART Hydralazine 25 mg (1 tab) three times a day  Labs today We will only contact you if something comes back abnormal or we need to make some changes. Otherwise no news is good news!  Your physician recommends that you schedule a follow-up appointment in: 3-4 months with Dr. [name] and an Echo  Your physician has requested that you have an echocardiogram. Echocardiography is a painless test that uses sound waves to create images of your heart. It provides your doctor with information about the size and shape of your heart and how well your heart's chambers and valves are working. This procedure takes approximately one hour. There are no restrictions for this procedure.

## 2018-10-09 ENCOUNTER — Ambulatory Visit (HOSPITAL_COMMUNITY)
Admission: RE | Admit: 2018-10-09 | Discharge: 2018-10-09 | Disposition: A | Discharge status: Home / Self Care | Payer: Medicare HMO | Source: Ambulatory Visit | Attending: Cardiology | Admitting: Cardiology

## 2018-10-09 DIAGNOSIS — I5042 Chronic combined systolic (congestive) and diastolic (congestive) heart failure: Secondary | ICD-10-CM | POA: Insufficient documentation

## 2018-10-09 LAB — BASIC METABOLIC PANEL
Anion gap: 8 (ref 5–15)
BUN: 38 mg/dL — AB (ref 8–23)
CHLORIDE: 107 mmol/L (ref 98–111)
CO2: 22 mmol/L (ref 22–32)
CREATININE: 1.96 mg/dL — AB (ref 0.61–1.24)
Calcium: 9.6 mg/dL (ref 8.9–10.3)
GFR calc Af Amer: 38 mL/min — ABNORMAL LOW (ref >60)
GFR, EST NON AFRICAN AMERICAN: 33 mL/min — AB (ref >60)
GLUCOSE: 117 mg/dL — AB (ref 70–99)
Potassium: 5.1 mmol/L (ref 3.5–5.1)
SODIUM: 137 mmol/L (ref 135–145)

## 2018-11-14 ENCOUNTER — Other Ambulatory Visit: Payer: Self-pay

## 2018-11-14 DIAGNOSIS — I739 Peripheral vascular disease, unspecified: Secondary | ICD-10-CM

## 2018-11-14 DIAGNOSIS — M79661 Pain in right lower leg: Secondary | ICD-10-CM

## 2018-11-14 DIAGNOSIS — M79662 Pain in left lower leg: Principal | ICD-10-CM

## 2018-11-14 DIAGNOSIS — I70212 Atherosclerosis of native arteries of extremities with intermittent claudication, left leg: Secondary | ICD-10-CM

## 2018-11-18 ENCOUNTER — Encounter: Payer: Self-pay | Admitting: Vascular Surgery

## 2018-11-18 ENCOUNTER — Ambulatory Visit (HOSPITAL_COMMUNITY)
Admission: RE | Admit: 2018-11-18 | Discharge: 2018-11-18 | Disposition: A | Discharge status: Home / Self Care | Payer: Medicare HMO | Source: Ambulatory Visit | Attending: Family | Admitting: Family

## 2018-11-18 ENCOUNTER — Other Ambulatory Visit: Payer: Self-pay

## 2018-11-18 ENCOUNTER — Ambulatory Visit: Payer: Medicare HMO | Admitting: Vascular Surgery

## 2018-11-18 ENCOUNTER — Encounter: Payer: Self-pay | Admitting: *Deleted

## 2018-11-18 ENCOUNTER — Other Ambulatory Visit: Payer: Self-pay | Admitting: *Deleted

## 2018-11-18 ENCOUNTER — Ambulatory Visit (INDEPENDENT_AMBULATORY_CARE_PROVIDER_SITE_OTHER)
Admission: RE | Admit: 2018-11-18 | Discharge: 2018-11-18 | Disposition: A | Discharge status: Home / Self Care | Payer: Medicare HMO | Source: Ambulatory Visit | Attending: Family | Admitting: Family

## 2018-11-18 VITALS — BP 151/73 | HR 53 | Temp 97.1°F | Resp 18 | Ht 69.5 in | Wt 240.0 lb

## 2018-11-18 DIAGNOSIS — M79662 Pain in left lower leg: Secondary | ICD-10-CM | POA: Diagnosis present

## 2018-11-18 DIAGNOSIS — I739 Peripheral vascular disease, unspecified: Secondary | ICD-10-CM

## 2018-11-18 DIAGNOSIS — M79661 Pain in right lower leg: Secondary | ICD-10-CM | POA: Diagnosis present

## 2018-11-18 DIAGNOSIS — I70212 Atherosclerosis of native arteries of extremities with intermittent claudication, left leg: Secondary | ICD-10-CM | POA: Diagnosis present

## 2018-11-18 MED ORDER — NYSTATIN 100000 UNIT/GM EX POWD: Freq: Four times a day (QID) | CUTANEOUS | 0 refills | Status: DC
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
## 2018-11-26 ENCOUNTER — Encounter (HOSPITAL_COMMUNITY)
Admission: RE | Disposition: A | Discharge status: Home / Self Care | Payer: Self-pay | Source: Home / Self Care | Attending: Vascular Surgery

## 2018-11-26 ENCOUNTER — Other Ambulatory Visit: Payer: Self-pay

## 2018-11-26 ENCOUNTER — Ambulatory Visit (HOSPITAL_COMMUNITY)
Admission: RE | Admit: 2018-11-26 | Discharge: 2018-11-26 | Disposition: A | Discharge status: Home / Self Care | Payer: Medicare HMO | Attending: Vascular Surgery | Admitting: Vascular Surgery

## 2018-11-26 DIAGNOSIS — Z951 Presence of aortocoronary bypass graft: Secondary | ICD-10-CM | POA: Insufficient documentation

## 2018-11-26 DIAGNOSIS — N189 Chronic kidney disease, unspecified: Secondary | ICD-10-CM | POA: Insufficient documentation

## 2018-11-26 DIAGNOSIS — I252 Old myocardial infarction: Secondary | ICD-10-CM | POA: Insufficient documentation

## 2018-11-26 DIAGNOSIS — K219 Gastro-esophageal reflux disease without esophagitis: Secondary | ICD-10-CM | POA: Diagnosis not present

## 2018-11-26 DIAGNOSIS — I5022 Chronic systolic (congestive) heart failure: Secondary | ICD-10-CM | POA: Diagnosis not present

## 2018-11-26 DIAGNOSIS — Z8249 Family history of ischemic heart disease and other diseases of the circulatory system: Secondary | ICD-10-CM | POA: Insufficient documentation

## 2018-11-26 DIAGNOSIS — E785 Hyperlipidemia, unspecified: Secondary | ICD-10-CM | POA: Diagnosis not present

## 2018-11-26 DIAGNOSIS — E1151 Type 2 diabetes mellitus with diabetic peripheral angiopathy without gangrene: Secondary | ICD-10-CM | POA: Insufficient documentation

## 2018-11-26 DIAGNOSIS — Z7902 Long term (current) use of antithrombotics/antiplatelets: Secondary | ICD-10-CM | POA: Diagnosis not present

## 2018-11-26 DIAGNOSIS — I2510 Atherosclerotic heart disease of native coronary artery without angina pectoris: Secondary | ICD-10-CM | POA: Diagnosis not present

## 2018-11-26 DIAGNOSIS — Z7982 Long term (current) use of aspirin: Secondary | ICD-10-CM | POA: Diagnosis not present

## 2018-11-26 DIAGNOSIS — M1990 Unspecified osteoarthritis, unspecified site: Secondary | ICD-10-CM | POA: Insufficient documentation

## 2018-11-26 DIAGNOSIS — Z794 Long term (current) use of insulin: Secondary | ICD-10-CM | POA: Diagnosis not present

## 2018-11-26 DIAGNOSIS — Z87891 Personal history of nicotine dependence: Secondary | ICD-10-CM | POA: Diagnosis not present

## 2018-11-26 DIAGNOSIS — I130 Hypertensive heart and chronic kidney disease with heart failure and stage 1 through stage 4 chronic kidney disease, or unspecified chronic kidney disease: Secondary | ICD-10-CM | POA: Insufficient documentation

## 2018-11-26 DIAGNOSIS — E1122 Type 2 diabetes mellitus with diabetic chronic kidney disease: Secondary | ICD-10-CM | POA: Diagnosis not present

## 2018-11-26 DIAGNOSIS — I70212 Atherosclerosis of native arteries of extremities with intermittent claudication, left leg: Secondary | ICD-10-CM

## 2018-11-26 DIAGNOSIS — Z955 Presence of coronary angioplasty implant and graft: Secondary | ICD-10-CM | POA: Diagnosis not present

## 2018-11-26 DIAGNOSIS — Z79899 Other long term (current) drug therapy: Secondary | ICD-10-CM | POA: Diagnosis not present

## 2018-11-26 HISTORY — PX: PERIPHERAL VASCULAR BALLOON ANGIOPLASTY: CATH118281

## 2018-11-26 HISTORY — PX: ABDOMINAL AORTOGRAM W/LOWER EXTREMITY: CATH118223

## 2018-11-26 LAB — GLUCOSE, CAPILLARY
GLUCOSE-CAPILLARY: 192 mg/dL — AB (ref 70–99)
Glucose-Capillary: 224 mg/dL — ABNORMAL HIGH (ref 70–99)

## 2018-11-26 LAB — POCT I-STAT 4, (NA,K, GLUC, HGB,HCT)
Glucose, Bld: 219 mg/dL — ABNORMAL HIGH (ref 70–99)
HCT: 28 % — ABNORMAL LOW (ref 39.0–52.0)
Hemoglobin: 9.5 g/dL — ABNORMAL LOW (ref 13.0–17.0)
POTASSIUM: 5.7 mmol/L — AB (ref 3.5–5.1)
Sodium: 138 mmol/L (ref 135–145)

## 2018-11-26 LAB — POCT I-STAT CREATININE: Creatinine, Ser: 2.1 mg/dL — ABNORMAL HIGH (ref 0.61–1.24)

## 2018-11-26 LAB — POCT ACTIVATED CLOTTING TIME: Activated Clotting Time: 219 seconds

## 2018-11-26 SURGERY — ABDOMINAL AORTOGRAM W/LOWER EXTREMITY
Anesthesia: LOCAL | Laterality: Left

## 2018-11-26 MED ORDER — ONDANSETRON HCL 4 MG/2ML IJ SOLN: 4.0000 mg | Freq: Four times a day (QID) | INTRAMUSCULAR | Status: DC | PRN

## 2018-11-26 MED ORDER — MIDAZOLAM HCL 2 MG/2ML IJ SOLN
INTRAMUSCULAR | Status: AC
  Filled 2018-11-26: qty 2

## 2018-11-26 MED ORDER — CLOPIDOGREL BISULFATE 75 MG PO TABS
ORAL_TABLET | ORAL | Status: AC
  Filled 2018-11-26: qty 1

## 2018-11-26 MED ORDER — SODIUM CHLORIDE 0.9% FLUSH: 3.0000 mL | Freq: Two times a day (BID) | INTRAVENOUS | Status: DC

## 2018-11-26 MED ORDER — SODIUM CHLORIDE 0.9 % IV SOLN
INTRAVENOUS | Status: DC
  Administered 2018-11-26: 11:00:00 via INTRAVENOUS

## 2018-11-26 MED ORDER — HEPARIN (PORCINE) IN NACL 1000-0.9 UT/500ML-% IV SOLN
INTRAVENOUS | Status: AC
  Filled 2018-11-26: qty 500

## 2018-11-26 MED ORDER — MIDAZOLAM HCL 2 MG/2ML IJ SOLN
INTRAMUSCULAR | Status: DC | PRN
  Administered 2018-11-26: 1 mg via INTRAVENOUS

## 2018-11-26 MED ORDER — IODIXANOL 320 MG/ML IV SOLN
INTRAVENOUS | Status: DC | PRN
  Administered 2018-11-26: 100 mL via INTRAVENOUS

## 2018-11-26 MED ORDER — HYDRALAZINE HCL 20 MG/ML IJ SOLN: 5.0000 mg | INTRAMUSCULAR | Status: DC | PRN

## 2018-11-26 MED ORDER — HEPARIN SODIUM (PORCINE) 1000 UNIT/ML IJ SOLN
INTRAMUSCULAR | Status: AC
  Filled 2018-11-26: qty 1

## 2018-11-26 MED ORDER — SODIUM CHLORIDE 0.9% FLUSH: 3.0000 mL | INTRAVENOUS | Status: DC | PRN

## 2018-11-26 MED ORDER — CLOPIDOGREL BISULFATE 75 MG PO TABS
ORAL_TABLET | ORAL | Status: DC | PRN
  Administered 2018-11-26: 75 mg via ORAL

## 2018-11-26 MED ORDER — HEPARIN SODIUM (PORCINE) 1000 UNIT/ML IJ SOLN
INTRAMUSCULAR | Status: DC | PRN
  Administered 2018-11-26: 11000 [IU] via INTRAVENOUS
  Administered 2018-11-26: 3000 [IU] via INTRAVENOUS

## 2018-11-26 MED ORDER — SODIUM CHLORIDE 0.9 % IV SOLN: 250.0000 mL | INTRAVENOUS | Status: DC | PRN

## 2018-11-26 MED ORDER — SODIUM CHLORIDE 0.9 % WEIGHT BASED INFUSION
1.0000 mL/kg/h | INTRAVENOUS | Status: DC
  Administered 2018-11-26: 1 mL/kg/h via INTRAVENOUS

## 2018-11-26 MED ORDER — ACETAMINOPHEN 325 MG PO TABS: 650.0000 mg | ORAL_TABLET | ORAL | Status: DC | PRN

## 2018-11-26 MED ORDER — NITROGLYCERIN 1 MG/10 ML FOR IR/CATH LAB
INTRA_ARTERIAL | Status: AC
  Filled 2018-11-26: qty 10

## 2018-11-26 MED ORDER — LABETALOL HCL 5 MG/ML IV SOLN: 10.0000 mg | INTRAVENOUS | Status: DC | PRN

## 2018-11-26 MED ORDER — LIDOCAINE HCL (PF) 1 % IJ SOLN
INTRAMUSCULAR | Status: DC | PRN
  Administered 2018-11-26: 15 mL via INTRADERMAL

## 2018-11-26 MED ORDER — NITROGLYCERIN 1 MG/10 ML FOR IR/CATH LAB
INTRA_ARTERIAL | Status: DC | PRN
  Administered 2018-11-26 (×3): 200 ug via INTRA_ARTERIAL
  Administered 2018-11-26: 300 ug via INTRA_ARTERIAL

## 2018-11-26 MED ORDER — LIDOCAINE HCL (PF) 1 % IJ SOLN
INTRAMUSCULAR | Status: AC
  Filled 2018-11-26: qty 30

## 2018-11-26 MED ORDER — FENTANYL CITRATE (PF) 100 MCG/2ML IJ SOLN
INTRAMUSCULAR | Status: AC
  Filled 2018-11-26: qty 2

## 2018-11-26 MED ORDER — FENTANYL CITRATE (PF) 100 MCG/2ML IJ SOLN
INTRAMUSCULAR | Status: DC | PRN
  Administered 2018-11-26 (×3): 25 ug via INTRAVENOUS

## 2018-11-26 SURGICAL SUPPLY — 33 items
BALLN COYOTE OTW 3X220X150 (BALLOONS) ×3
BALLN STERLING OTW 3X100X150 (BALLOONS) ×3
BALLOON COYOTE OTW 3X220X150 (BALLOONS) IMPLANT
BALLOON STERLING OTW 3X100X150 (BALLOONS) IMPLANT
CATH CXI SUPP 2.6F 150 ST (CATHETERS) ×1 IMPLANT
CATH OMNI FLUSH 5F 65CM (CATHETERS) ×1 IMPLANT
CATH QUICKCROSS .035X135CM (MICROCATHETER) ×1 IMPLANT
CATH QUICKCROSS ANG SELECT (CATHETERS) ×2 IMPLANT
CATH SOFT-VU 4F 65 STRAIGHT (CATHETERS) IMPLANT
CATH SOFT-VU STRAIGHT 4F 65CM (CATHETERS) ×1
CLOSURE MYNX CONTROL 6F/7F (Vascular Products) ×1 IMPLANT
DEVICE TORQUE .014-.018 (MISCELLANEOUS) IMPLANT
DEVICE TORQUE .025-.038 (MISCELLANEOUS) ×2 IMPLANT
FILTER CO2 0.2 MICRON (VASCULAR PRODUCTS) ×1 IMPLANT
GLIDEWIRE ADV .035X260CM (WIRE) ×1 IMPLANT
GUIDEWIRE ANGLED .035X150CM (WIRE) ×1 IMPLANT
KIT ENCORE 26 ADVANTAGE (KITS) ×1 IMPLANT
KIT MICROPUNCTURE NIT STIFF (SHEATH) ×1 IMPLANT
KIT PV (KITS) ×3 IMPLANT
RESERVOIR CO2 (VASCULAR PRODUCTS) ×1 IMPLANT
SET FLUSH CO2 (MISCELLANEOUS) ×1 IMPLANT
SHEATH HIGHFLEX ANSEL 6FRX55 (SHEATH) ×1 IMPLANT
SHEATH PINNACLE 5F 10CM (SHEATH) ×1 IMPLANT
SHEATH PINNACLE 6F 10CM (SHEATH) ×1 IMPLANT
SHEATH PROBE COVER 6X72 (BAG) ×1 IMPLANT
SYR MEDRAD MARK V 150ML (SYRINGE) ×1 IMPLANT
TORQUE DEVICE .014-.018 (MISCELLANEOUS)
TRANSDUCER W/STOPCOCK (MISCELLANEOUS) ×3 IMPLANT
TRAY PV CATH (CUSTOM PROCEDURE TRAY) ×3 IMPLANT
WIRE BENTSON .035X145CM (WIRE) ×1 IMPLANT
WIRE G V18X300CM (WIRE) ×3 IMPLANT
WIRE ROSEN-J .035X180CM (WIRE) ×1 IMPLANT
WIRE SPARTACORE .014X300CM (WIRE) ×1 IMPLANT

## 2018-11-26 NOTE — Discharge Instructions (Signed)
Angiogram  An angiogram is an X-ray test. It is used to check your blood vessels. In this test, a dye is put into the blood vessel being checked. The dye is put into the blood vessel through a long, thin tube (catheter). The dye shows up on X-rays. It helps your doctor see if there is a blockage or other problem in the blood vessel. The catheter may go through:  Your upper leg area (groin).  The fold of your arm, near your elbow.  Your wrist. What happens before the procedure? Staying hydrated Follow instructions from your doctor about hydration, which may include:  Up to 2 hours before the procedure - you may continue to drink clear liquids, such as water, clear fruit juice, black coffee, and plain tea. Eating and drinking restrictions Follow instructions from your doctor about eating and drinking, which may include:  8 hours before the procedure - stop eating heavy meals or foods such as meat, fried foods, or fatty foods.  6 hours before the procedure - stop eating light meals or foods, such as toast or cereal.  6 hours before the procedure - stop drinking milk or drinks that contain milk.  2 hours before the procedure - stop drinking clear liquids. General instructions  Ask your doctor about: ? Changing or stopping your normal medicines. This is important if you take diabetes medicines or blood thinners. ? Taking medicines such as aspirin and ibuprofen. These medicines can thin your blood. Do not take these medicines before your procedure if your doctor tells you not to.  You may have blood samples taken.  Plan to have someone take you home from the hospital or clinic.  If you will be going home right after the procedure, plan to have someone with you for 24 hours. What happens during the procedure?  To lower your risk of infection: ? Your health care team will wash their hands. ? Your skin will be washed with soap. ? Hair may be removed from the area.  You will lie on  your back on an X-ray table. You may be strapped to the table if it is tilted.  An IV tube will be inserted into one of your veins.  Small patches (electrodes) may be placed on your chest to check your heart rate during the procedure.  You will be given one or more of the following: ? A medicine to help you relax (sedative). ? A medicine to numb the area where the catheter will be inserted (local anesthetic).  The catheter will be inserted into an artery using a guide wire.  Using a type of X-ray to see, your doctor will move the catheter into the blood vessel to check it.  Dye will be put in through the catheter. X-rays of your blood vessels will then be taken.  After the X-ray is done, the catheter will be taken out.  A bandage will be placed over the site where the catheter was put in. Pressure will be applied to help stop any bleeding. The procedure may vary among doctors and hospitals. What happens after the procedure?  You will be kept in bed lying flat for several hours. If the catheter was put in through your leg, you will be told not to bend or cross your legs.  Your blood pressure, heart rate, breathing rate, and blood oxygen level will be checked until the medicines you were given have worn off.  The insertion area and the pulse in your feet or   wrist will be checked often.  You will be told to drink plenty of fluids. This will help get the dye out of your body.  More blood tests and X-rays may be done.  Tests to check the electrical activity in your heart (electrocardiogram) may be done.  Do not drive for 24 hours if you were given a sedative.  It is up to you to get the results of your procedure. Ask your doctor when your results will be ready. Summary  An angiogram is an X-ray test used to check your blood vessels.  In this procedure, a dye is injected through a catheter into an artery. X-rays are then taken.  After the procedure, you will need to lie flat for  several hours and drink plenty of fluids. This information is not intended to replace advice given to you by your health care provider. Make sure you discuss any questions you have with your health care provider. Document Released: 11/30/2008 Document Revised: 04/18/2017 Document Reviewed: 10/10/2016 Elsevier Interactive Patient Education  2019 [employer]  
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
## 2018-11-26 NOTE — Op Note (Signed)
Patient name: Dennis [name] MRN: [id number] DOB: 16-Feb-1946 Sex: male  11/26/2018 Pre-operative Diagnosis: Left lower extremity lifestyle limiting short distance claudication Post-operative diagnosis:  Same Surgeon:  Cephus Shelling, MD Procedure Performed: 1.  Ultrasound-guided access of the right common femoral artery 2.  CO2 aortogram 3.  Left lower extremity arteriogram with selection of third order branches using CO2 4.  Left peroneal angioplasty (3 mm Sterling) 5.  Left anterior tibial artery angioplasty (3 mm Coyote) 6.  117 minutes of monitored moderate conscious sedation time 7.  Mynx closure of the right common femoral artery  Indications: Patient is a 73 year old male who initially presented to my office with short distance lifestyle limiting claudication of the bilateral lower extremities but stated his left leg was far worse.  He had previously undergone multiple left lower extremity interventions by Dr. [name] with cardiology including a left SFA stent in the setting of single-vessel peroneal runoff.  I initially tried to treat him conservatively given only claudication symptoms.  Ultimately on follow-up recently he states he cannot go on living with his current symptoms and was willing to take a risk for limb loss in order to have an arteriogram and possible intervention.  He presents after risks and benefits were discussed.  Findings: CO2 aortogram showed no significant aortoiliac disease.  Left lower extremity arteriogram showed a patent common femoral, profunda, and SFA.  The left SFA stent was widely patent that was previously placed.  Patient had a patent above and below-knee popliteal artery.  Patient had single-vessel tibial runoff via the peroneal artery.  In the proximal to mid segment of the artery there was a 50 to 60% stenosis in a diseased segment.  There was a stump of anterior tibial with a short focal chronic total occlusion and reconstitution distally with a  small artery.  The posterior tibial reconstituted at the ankle via collateral of the peroneal and was otherwise occluded.  Ultimately after getting our imaging we placed a sheath over the aortic bifurcation and into the left SFA.  While getting a planning shot in order to try and recanalize the anterior tibial that had a chronic total occlusion it appeared that the peroneal was lost for a short segment.  This happened before we performed any intervention and I thought it could have been from potentially the sheath and/or low flow through the diseased artery that shut down.  Ultimately I got a wire across the peroneal with a V 18 and performed angioplasty of the diseased segment with a 3 mm Sterling.  Patient had 0% residual stenosis with inline flow through the peroneal and no evidence of thrombus.  I then cannulated the anterior tibial and was able to accross the chronic total occlusion and got into the dorsalis pedis in the foot and then performed angioplasty of the entire anterior tibial with a 3 mm Coyote.    At the completion of the case patient had two-vessel runoff via peroneal and anterior tibial artery with inline flow into the foot.   Procedure:  The patient was identified in the holding area and taken to room 8.  The patient was then placed supine on the table and prepped and draped in the usual sterile fashion.  A time out was called.  Ultrasound was used to evaluate the right common femoral artery.  It was patent .  A digital ultrasound image was acquired.  A micropuncture needle was used to access the right common femoral artery under ultrasound guidance.  An 018 wire was advanced without resistance and a micropuncture sheath was placed.  The 018 wire was removed and a benson wire was placed.  The micropuncture sheath was exchanged for a 5 french sheath.  An omniflush catheter was advanced over the wire to the level of L-1.  An abdominal angiogram was obtained with CO2.  Next the aortic  bifurcation was crossed with the Omni Flush catheter and we advanced a Bentson wire.  Unfortunately could not get the Omni track so exchanged for a 4 French straight catheter into the distal left external iliac.  We then performed left lower extremity arteriogram with runoff using CO2.  It was difficult to evaluate the tibials we had to use some hand injected contrast in order to see runoff below the knee.  Ultimately he appeared to have a stump of anterior tibial with a short CTO and distal reconstitution.  My initial plan was to recanalize the anterior tibial given single-vessel peroneal runoff.  At that point in time I used a Glidewire advantage over the aortic bifurcation and exchanged for a long 6 French 55 cm Ansell sheath that was placed in the left SFA.  The patient was given 11,000 units of IV heparin at that time and ACT was checked to maintain greater than 250.  I then initially used a V 18 wire with a quick cross select catheter to try and cannulate the anterior tibial.  During a planning injection it appeared I lost a focal segment of the peroneal and that was his only runoff.  Given that finding I then got a V 18 wire all the way down the peroneal and then confirmed over my 018 catheter that I was indeed in the true lumen.  I then performed angioplasty of the proximal to mid peroneal where it had a diseased segment with a 50 to 60% stenosis with a 3 mm Sterling.  I then shot another arteriogram and it showed that we had inline flow back through the peroneal again with no residual stenosis.  Happy with those results I then used a multitude of catheters and wires including a quick cross select and a CXI catheter and ultimately got a V 18 wire into the anterior tibial stump.  I then used a number of maneuvers and got about halfway down the anterior tibial across a short focal chronic total occlusion and into the true lumen of the artery distally.  I did exchange for 014 wire and I finally get my CXI  catheter to track into the dorsalis pedis of the foot where I confirmed I was in the true lumen.  I then performed angioplasty of the entire anterior tibial with a 3 mm Coyote.  One final injection showed inline flow through the peroneal and anterior tibial in the left leg.  At that point in time I removed my catheters and wires in the left lower extremity.  Over a Rosen wire then exchanged for a short 6 French sheath in the right groin.  I then did a CO2 arteriogram of the right groin that showed I had good access in the common femoral given a previous groin complication in the past.  A mynx closure device was deployed in the right groin and manual pressure was held for 10 minutes.  He had a palpable right dorsalis pedis at the completion of the case.    Cephus Shelling, MD [facility] Office: [phone] Pager: [phone]  Cephus Shelling

## 2018-11-27 ENCOUNTER — Telehealth: Payer: Self-pay | Admitting: Vascular Surgery

## 2018-11-27 ENCOUNTER — Encounter (HOSPITAL_COMMUNITY): Payer: Self-pay | Admitting: Vascular Surgery

## 2018-11-27 NOTE — Telephone Encounter (Signed)
Called and spoke with patient.  He said he will see his appt on MyChart advised him I will mail him the follow up paperwork to fill out and bring with him to his appt on 12/30/18 1:00 and 2:00 U/S and appt at 2:30 with Dr. Chestine Spore

## 2018-12-22 ENCOUNTER — Other Ambulatory Visit: Payer: Self-pay

## 2018-12-22 DIAGNOSIS — I739 Peripheral vascular disease, unspecified: Secondary | ICD-10-CM

## 2018-12-22 NOTE — Progress Notes (Signed)
abi

## 2018-12-29 ENCOUNTER — Telehealth (HOSPITAL_COMMUNITY): Payer: Self-pay | Admitting: Rehabilitation

## 2018-12-29 NOTE — Telephone Encounter (Signed)
The above patient or their representative was contacted and gave the following answers to these questions:         Do you have any of the following symptoms? No  Fever                    Cough                   Shortness of breath  Do  you have any of the following other symptoms? No   muscle pain         vomiting,        diarrhea        rash         weakness        red eye        abdominal pain         bruising          bruising or bleeding              joint pain           severe headache    Have you been in contact with someone who was or has been sick in the past 2 weeks? No  Yes                 Unsure                         Unable to assess   Does the person that you were in contact with have any of the following symptoms? No  Cough         shortness of breath           muscle pain         vomiting,            diarrhea            rash            weakness           fever            red eye           abdominal pain           bruising  or  bleeding                joint pain                severe headache               Have you  or someone you have been in contact with traveled internationally in th last month? No         If yes, which countries? n/a   Have you  or someone you have been in contact with traveled outside [place] in th last month? No        If yes, which state and city?  n/a   COMMENTS OR ACTION PLAN FOR THIS PATIENT:         

## 2018-12-30 ENCOUNTER — Encounter: Payer: Medicare HMO | Admitting: Vascular Surgery

## 2018-12-30 ENCOUNTER — Encounter: Payer: Self-pay | Admitting: Vascular Surgery

## 2018-12-30 ENCOUNTER — Ambulatory Visit (INDEPENDENT_AMBULATORY_CARE_PROVIDER_SITE_OTHER): Payer: Medicare HMO | Admitting: Vascular Surgery

## 2018-12-30 ENCOUNTER — Encounter (HOSPITAL_COMMUNITY): Payer: Medicare HMO

## 2018-12-30 ENCOUNTER — Ambulatory Visit (HOSPITAL_COMMUNITY)
Admission: RE | Admit: 2018-12-30 | Discharge: 2018-12-30 | Disposition: A | Discharge status: Home / Self Care | Payer: Medicare HMO | Source: Ambulatory Visit | Attending: Vascular Surgery | Admitting: Vascular Surgery

## 2018-12-30 ENCOUNTER — Other Ambulatory Visit: Payer: Self-pay

## 2018-12-30 ENCOUNTER — Ambulatory Visit (INDEPENDENT_AMBULATORY_CARE_PROVIDER_SITE_OTHER)
Admission: RE | Admit: 2018-12-30 | Discharge: 2018-12-30 | Disposition: A | Discharge status: Home / Self Care | Payer: Medicare HMO | Source: Ambulatory Visit | Attending: Vascular Surgery | Admitting: Vascular Surgery

## 2018-12-30 VITALS — BP 127/62 | HR 55 | Resp 20 | Ht 69.0 in | Wt 227.0 lb

## 2018-12-30 DIAGNOSIS — I739 Peripheral vascular disease, unspecified: Secondary | ICD-10-CM
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
## 2018-12-31 ENCOUNTER — Other Ambulatory Visit (HOSPITAL_COMMUNITY): Payer: Self-pay | Admitting: Internal Medicine

## 2019-02-03 ENCOUNTER — Other Ambulatory Visit (HOSPITAL_COMMUNITY): Payer: Medicare HMO

## 2019-02-03 ENCOUNTER — Encounter (HOSPITAL_COMMUNITY): Payer: Medicare HMO | Admitting: Internal Medicine

## 2019-04-02 ENCOUNTER — Telehealth (HOSPITAL_COMMUNITY): Payer: Self-pay | Admitting: *Deleted

## 2019-04-02 NOTE — Telephone Encounter (Signed)
[facility] pharmacist called to see if pt can be seen sooner. His bp is 90's/60's bnp, 248, 1-2+ pitting edema in ankles, sob w/exertion, and hes experienced dizziness lately. They advised him to go to the ED but he refused and wanted an office visit for guidance from [place]. Pt is taking all meds as prescribed. Pt has echo and office visit scheduled 8/3 but wants to be seen sooner.  Routed to [employer] for advice

## 2019-04-09 ENCOUNTER — Encounter (HOSPITAL_COMMUNITY): Payer: Medicare HMO

## 2019-04-09 ENCOUNTER — Other Ambulatory Visit (HOSPITAL_COMMUNITY): Payer: Self-pay | Admitting: Internal Medicine

## 2019-04-10 ENCOUNTER — Ambulatory Visit (HOSPITAL_COMMUNITY)
Admission: RE | Admit: 2019-04-10 | Discharge: 2019-04-10 | Disposition: A | Discharge status: Home / Self Care | Payer: Medicare HMO | Source: Ambulatory Visit | Attending: Internal Medicine | Admitting: Internal Medicine

## 2019-04-10 ENCOUNTER — Encounter (HOSPITAL_COMMUNITY): Payer: Medicare HMO

## 2019-04-10 ENCOUNTER — Other Ambulatory Visit: Payer: Self-pay

## 2019-04-10 ENCOUNTER — Encounter (HOSPITAL_COMMUNITY): Payer: Self-pay | Admitting: Internal Medicine

## 2019-04-10 VITALS — BP 130/69 | HR 70 | Wt 233.0 lb

## 2019-04-10 DIAGNOSIS — E1151 Type 2 diabetes mellitus with diabetic peripheral angiopathy without gangrene: Secondary | ICD-10-CM | POA: Insufficient documentation

## 2019-04-10 DIAGNOSIS — M47812 Spondylosis without myelopathy or radiculopathy, cervical region: Secondary | ICD-10-CM | POA: Insufficient documentation

## 2019-04-10 DIAGNOSIS — K219 Gastro-esophageal reflux disease without esophagitis: Secondary | ICD-10-CM | POA: Insufficient documentation

## 2019-04-10 DIAGNOSIS — M1710 Unilateral primary osteoarthritis, unspecified knee: Secondary | ICD-10-CM | POA: Diagnosis not present

## 2019-04-10 DIAGNOSIS — I130 Hypertensive heart and chronic kidney disease with heart failure and stage 1 through stage 4 chronic kidney disease, or unspecified chronic kidney disease: Secondary | ICD-10-CM | POA: Diagnosis present

## 2019-04-10 DIAGNOSIS — Z87891 Personal history of nicotine dependence: Secondary | ICD-10-CM | POA: Insufficient documentation

## 2019-04-10 DIAGNOSIS — Z91048 Other nonmedicinal substance allergy status: Secondary | ICD-10-CM | POA: Diagnosis not present

## 2019-04-10 DIAGNOSIS — E785 Hyperlipidemia, unspecified: Secondary | ICD-10-CM | POA: Diagnosis not present

## 2019-04-10 DIAGNOSIS — Z7902 Long term (current) use of antithrombotics/antiplatelets: Secondary | ICD-10-CM | POA: Diagnosis not present

## 2019-04-10 DIAGNOSIS — Z79899 Other long term (current) drug therapy: Secondary | ICD-10-CM | POA: Diagnosis not present

## 2019-04-10 DIAGNOSIS — I255 Ischemic cardiomyopathy: Secondary | ICD-10-CM | POA: Insufficient documentation

## 2019-04-10 DIAGNOSIS — Z955 Presence of coronary angioplasty implant and graft: Secondary | ICD-10-CM | POA: Insufficient documentation

## 2019-04-10 DIAGNOSIS — I2510 Atherosclerotic heart disease of native coronary artery without angina pectoris: Secondary | ICD-10-CM | POA: Diagnosis not present

## 2019-04-10 DIAGNOSIS — I25810 Atherosclerosis of coronary artery bypass graft(s) without angina pectoris: Secondary | ICD-10-CM | POA: Insufficient documentation

## 2019-04-10 DIAGNOSIS — E875 Hyperkalemia: Secondary | ICD-10-CM | POA: Diagnosis not present

## 2019-04-10 DIAGNOSIS — I5042 Chronic combined systolic (congestive) and diastolic (congestive) heart failure: Secondary | ICD-10-CM

## 2019-04-10 DIAGNOSIS — I5022 Chronic systolic (congestive) heart failure: Secondary | ICD-10-CM | POA: Diagnosis present

## 2019-04-10 DIAGNOSIS — Z883 Allergy status to other anti-infective agents status: Secondary | ICD-10-CM | POA: Diagnosis not present

## 2019-04-10 DIAGNOSIS — Z7982 Long term (current) use of aspirin: Secondary | ICD-10-CM | POA: Insufficient documentation

## 2019-04-10 DIAGNOSIS — I739 Peripheral vascular disease, unspecified: Secondary | ICD-10-CM

## 2019-04-10 DIAGNOSIS — Z794 Long term (current) use of insulin: Secondary | ICD-10-CM | POA: Insufficient documentation

## 2019-04-10 DIAGNOSIS — N183 Chronic kidney disease, stage 3 unspecified: Secondary | ICD-10-CM

## 2019-04-10 DIAGNOSIS — E1122 Type 2 diabetes mellitus with diabetic chronic kidney disease: Secondary | ICD-10-CM | POA: Diagnosis not present

## 2019-04-10 DIAGNOSIS — Z79890 Hormone replacement therapy (postmenopausal): Secondary | ICD-10-CM | POA: Insufficient documentation

## 2019-04-10 DIAGNOSIS — Z951 Presence of aortocoronary bypass graft: Secondary | ICD-10-CM | POA: Insufficient documentation

## 2019-04-10 DIAGNOSIS — Z888 Allergy status to other drugs, medicaments and biological substances status: Secondary | ICD-10-CM | POA: Insufficient documentation

## 2019-04-10 LAB — BASIC METABOLIC PANEL
Anion gap: 12 (ref 5–15)
BUN: 74 mg/dL — ABNORMAL HIGH (ref 8–23)
CO2: 27 mmol/L (ref 22–32)
Calcium: 9.6 mg/dL (ref 8.9–10.3)
Chloride: 97 mmol/L — ABNORMAL LOW (ref 98–111)
Creatinine, Ser: 2.74 mg/dL — ABNORMAL HIGH (ref 0.61–1.24)
GFR calc Af Amer: 25 mL/min — ABNORMAL LOW (ref >60)
GFR calc non Af Amer: 22 mL/min — ABNORMAL LOW (ref >60)
Glucose, Bld: 183 mg/dL — ABNORMAL HIGH (ref 70–99)
Potassium: 4 mmol/L (ref 3.5–5.1)
Sodium: 136 mmol/L (ref 135–145)

## 2019-04-10 LAB — BRAIN NATRIURETIC PEPTIDE: B Natriuretic Peptide: 185.1 pg/mL — ABNORMAL HIGH (ref 0.0–100.0)

## 2019-04-10 MED ORDER — METOLAZONE 2.5 MG PO TABS: 2.5000 mg | ORAL_TABLET | ORAL | 0 refills | Status: DC | PRN
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
## 2019-04-10 NOTE — Patient Instructions (Addendum)
Lab work done today. We will notify you of any abnormal lab work. No news is good news.  START Metolazone 2.5mg  tab as needed for swelling. Do not take more than 2 tabs in 1 month's time unless instructed by the [facility].  Please follow up with the [facility] in 6 month. Your August appointment has been cancelled per Dr. Haroldine Laws.  At the [facility], you and your health needs are our priority. As part of our continuing mission to provide you with exceptional heart care, we have created designated Provider Care Teams. These Care Teams include your primary Cardiologist (physician) and Advanced Practice Providers (APPs- Physician Assistants and Nurse Practitioners) who all work together to provide you with the care you need, when you need it.   You may see any of the following providers on your designated Care Team at your next follow up: Marland Kitchen Dr Glori Bickers . Dr Loralie Champagne . Darrick Grinder, NP

## 2019-04-13 ENCOUNTER — Telehealth (HOSPITAL_COMMUNITY): Payer: Self-pay

## 2019-04-13 DIAGNOSIS — I5042 Chronic combined systolic (congestive) and diastolic (congestive) heart failure: Secondary | ICD-10-CM

## 2019-04-13 NOTE — Telephone Encounter (Signed)
-----   Message from Jolaine Artist, MD sent at 04/11/2019 12:50 PM EDT ----- Repeat BMET 2 weeks.

## 2019-04-13 NOTE — Telephone Encounter (Signed)
-----   Message from Daniel R Bensimhon, MD sent at 04/11/2019 12:50 PM EDT ----- Repeat BMET 2 weeks. 

## 2019-04-13 NOTE — Telephone Encounter (Signed)
Aware of results. Lab appt made for 2 week. Pt appreciative. appt reminder card mailed

## 2019-04-20 ENCOUNTER — Other Ambulatory Visit (HOSPITAL_COMMUNITY): Payer: Medicare HMO

## 2019-04-20 ENCOUNTER — Encounter (HOSPITAL_COMMUNITY): Payer: Medicare HMO | Admitting: Internal Medicine

## 2019-04-27 ENCOUNTER — Ambulatory Visit (HOSPITAL_COMMUNITY)
Admission: RE | Admit: 2019-04-27 | Discharge: 2019-04-27 | Disposition: A | Discharge status: Home / Self Care | Payer: Medicare HMO | Source: Ambulatory Visit | Attending: Cardiology | Admitting: Cardiology

## 2019-04-27 ENCOUNTER — Other Ambulatory Visit: Payer: Self-pay

## 2019-04-27 DIAGNOSIS — I5042 Chronic combined systolic (congestive) and diastolic (congestive) heart failure: Secondary | ICD-10-CM | POA: Insufficient documentation

## 2019-04-27 LAB — BASIC METABOLIC PANEL
Anion gap: 11 (ref 5–15)
BUN: 63 mg/dL — ABNORMAL HIGH (ref 8–23)
CO2: 27 mmol/L (ref 22–32)
Calcium: 9.4 mg/dL (ref 8.9–10.3)
Chloride: 103 mmol/L (ref 98–111)
Creatinine, Ser: 2.5 mg/dL — ABNORMAL HIGH (ref 0.61–1.24)
GFR calc Af Amer: 28 mL/min — ABNORMAL LOW (ref >60)
GFR calc non Af Amer: 25 mL/min — ABNORMAL LOW (ref >60)
Glucose, Bld: 91 mg/dL (ref 70–99)
Potassium: 4.5 mmol/L (ref 3.5–5.1)
Sodium: 141 mmol/L (ref 135–145)

## 2019-07-16 ENCOUNTER — Encounter (HOSPITAL_COMMUNITY): Payer: Medicare HMO | Admitting: Internal Medicine

## 2019-08-23 ENCOUNTER — Other Ambulatory Visit (HOSPITAL_COMMUNITY): Payer: Self-pay | Admitting: Internal Medicine

## 2019-09-27 ENCOUNTER — Other Ambulatory Visit (HOSPITAL_COMMUNITY): Payer: Self-pay | Admitting: Internal Medicine

## 2019-11-18 ENCOUNTER — Other Ambulatory Visit (HOSPITAL_COMMUNITY): Payer: Self-pay | Admitting: Internal Medicine

## 2019-12-20 ENCOUNTER — Other Ambulatory Visit (HOSPITAL_COMMUNITY): Payer: Self-pay | Admitting: Internal Medicine

## 2020-02-04 ENCOUNTER — Encounter (HOSPITAL_COMMUNITY): Payer: Self-pay

## 2020-02-04 ENCOUNTER — Encounter (HOSPITAL_COMMUNITY): Payer: Self-pay | Admitting: Internal Medicine

## 2020-02-04 ENCOUNTER — Other Ambulatory Visit: Payer: Self-pay

## 2020-02-04 ENCOUNTER — Ambulatory Visit (HOSPITAL_COMMUNITY)
Admission: RE | Admit: 2020-02-04 | Discharge: 2020-02-04 | Disposition: A | Discharge status: Home / Self Care | Payer: Medicare HMO | Source: Ambulatory Visit | Attending: Internal Medicine | Admitting: Internal Medicine

## 2020-02-04 VITALS — BP 138/82 | HR 55 | Wt 187.4 lb

## 2020-02-04 DIAGNOSIS — I255 Ischemic cardiomyopathy: Secondary | ICD-10-CM | POA: Diagnosis not present

## 2020-02-04 DIAGNOSIS — E1151 Type 2 diabetes mellitus with diabetic peripheral angiopathy without gangrene: Secondary | ICD-10-CM | POA: Diagnosis not present

## 2020-02-04 DIAGNOSIS — Z888 Allergy status to other drugs, medicaments and biological substances status: Secondary | ICD-10-CM | POA: Insufficient documentation

## 2020-02-04 DIAGNOSIS — I10 Essential (primary) hypertension: Secondary | ICD-10-CM

## 2020-02-04 DIAGNOSIS — N183 Chronic kidney disease, stage 3 unspecified: Secondary | ICD-10-CM | POA: Diagnosis not present

## 2020-02-04 DIAGNOSIS — F17290 Nicotine dependence, other tobacco product, uncomplicated: Secondary | ICD-10-CM | POA: Diagnosis not present

## 2020-02-04 DIAGNOSIS — I959 Hypotension, unspecified: Secondary | ICD-10-CM | POA: Insufficient documentation

## 2020-02-04 DIAGNOSIS — E1122 Type 2 diabetes mellitus with diabetic chronic kidney disease: Secondary | ICD-10-CM | POA: Insufficient documentation

## 2020-02-04 DIAGNOSIS — I5022 Chronic systolic (congestive) heart failure: Secondary | ICD-10-CM | POA: Insufficient documentation

## 2020-02-04 DIAGNOSIS — Z951 Presence of aortocoronary bypass graft: Secondary | ICD-10-CM | POA: Diagnosis not present

## 2020-02-04 DIAGNOSIS — Z7902 Long term (current) use of antithrombotics/antiplatelets: Secondary | ICD-10-CM | POA: Insufficient documentation

## 2020-02-04 DIAGNOSIS — E875 Hyperkalemia: Secondary | ICD-10-CM | POA: Insufficient documentation

## 2020-02-04 DIAGNOSIS — Z794 Long term (current) use of insulin: Secondary | ICD-10-CM | POA: Diagnosis not present

## 2020-02-04 DIAGNOSIS — Z8616 Personal history of COVID-19: Secondary | ICD-10-CM | POA: Insufficient documentation

## 2020-02-04 DIAGNOSIS — G8929 Other chronic pain: Secondary | ICD-10-CM | POA: Diagnosis not present

## 2020-02-04 DIAGNOSIS — R5383 Other fatigue: Secondary | ICD-10-CM | POA: Diagnosis not present

## 2020-02-04 DIAGNOSIS — Z955 Presence of coronary angioplasty implant and graft: Secondary | ICD-10-CM | POA: Diagnosis not present

## 2020-02-04 DIAGNOSIS — I130 Hypertensive heart and chronic kidney disease with heart failure and stage 1 through stage 4 chronic kidney disease, or unspecified chronic kidney disease: Secondary | ICD-10-CM | POA: Insufficient documentation

## 2020-02-04 DIAGNOSIS — Z79890 Hormone replacement therapy (postmenopausal): Secondary | ICD-10-CM | POA: Diagnosis not present

## 2020-02-04 DIAGNOSIS — E785 Hyperlipidemia, unspecified: Secondary | ICD-10-CM | POA: Insufficient documentation

## 2020-02-04 DIAGNOSIS — I739 Peripheral vascular disease, unspecified: Secondary | ICD-10-CM | POA: Insufficient documentation

## 2020-02-04 DIAGNOSIS — Z79899 Other long term (current) drug therapy: Secondary | ICD-10-CM | POA: Insufficient documentation

## 2020-02-04 DIAGNOSIS — Z7982 Long term (current) use of aspirin: Secondary | ICD-10-CM | POA: Diagnosis not present

## 2020-02-04 DIAGNOSIS — N1832 Chronic kidney disease, stage 3b: Secondary | ICD-10-CM

## 2020-02-04 DIAGNOSIS — I2510 Atherosclerotic heart disease of native coronary artery without angina pectoris: Secondary | ICD-10-CM | POA: Diagnosis not present

## 2020-02-04 DIAGNOSIS — I5042 Chronic combined systolic (congestive) and diastolic (congestive) heart failure: Secondary | ICD-10-CM

## 2020-02-04 DIAGNOSIS — I259 Chronic ischemic heart disease, unspecified: Secondary | ICD-10-CM

## 2020-02-04 LAB — CBC
HCT: 33.4 % — ABNORMAL LOW (ref 39.0–52.0)
Hemoglobin: 10 g/dL — ABNORMAL LOW (ref 13.0–17.0)
MCH: 25.1 pg — ABNORMAL LOW (ref 26.0–34.0)
MCHC: 29.9 g/dL — ABNORMAL LOW (ref 30.0–36.0)
MCV: 83.9 fL (ref 80.0–100.0)
Platelets: 279 10*3/uL (ref 150–400)
RBC: 3.98 MIL/uL — ABNORMAL LOW (ref 4.22–5.81)
RDW: 16.3 % — ABNORMAL HIGH (ref 11.5–15.5)
WBC: 6 10*3/uL (ref 4.0–10.5)
nRBC: 0 % (ref 0.0–0.2)

## 2020-02-04 LAB — BRAIN NATRIURETIC PEPTIDE: B Natriuretic Peptide: 1256.7 pg/mL — ABNORMAL HIGH (ref 0.0–100.0)

## 2020-02-04 NOTE — Patient Instructions (Signed)
Labs done today, your results will be available in MyChart, we will contact you for abnormal readings.  Your physician has requested that you have an echocardiogram. Echocardiography is a painless test that uses sound waves to create images of your heart. It provides your doctor with information about the size and shape of your heart and how well your heart's chambers and valves are working. This procedure takes approximately one hour. There are no restrictions for this procedure.  Your physician recommends that you schedule a follow-up appointment in: 3-4 months  If you have any questions or concerns before your next appointment please send [place] a message through [place] or call our office at [phone].  At the [facility], you and your health needs are our priority. As part of our continuing mission to provide you with exceptional heart care, we have created designated Provider Care Teams. These Care Teams include your primary Cardiologist (physician) and Advanced Practice Providers (APPs- Physician Assistants and Nurse Practitioners) who all work together to provide you with the care you need, when you need it.   You may see any of the following providers on your designated Care Team at your next follow up: Marland Kitchen Dr Arvilla Meres . Dr Marca Ancona . Tonye Becket, NP . Robbie Lis, PA . Karle Plumber, PharmD   Please be sure to bring in all your medications bottles to every appointment.
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
## 2020-02-19 ENCOUNTER — Other Ambulatory Visit: Payer: Self-pay

## 2020-02-19 ENCOUNTER — Ambulatory Visit (HOSPITAL_COMMUNITY)
Admission: RE | Admit: 2020-02-19 | Discharge: 2020-02-19 | Disposition: A | Discharge status: Home / Self Care | Payer: Medicare HMO | Source: Ambulatory Visit | Attending: Cardiology | Admitting: Cardiology

## 2020-02-19 DIAGNOSIS — I5042 Chronic combined systolic (congestive) and diastolic (congestive) heart failure: Secondary | ICD-10-CM | POA: Insufficient documentation

## 2020-02-19 DIAGNOSIS — I110 Hypertensive heart disease with heart failure: Secondary | ICD-10-CM | POA: Diagnosis not present

## 2020-02-19 DIAGNOSIS — I340 Nonrheumatic mitral (valve) insufficiency: Secondary | ICD-10-CM | POA: Insufficient documentation

## 2020-02-19 DIAGNOSIS — E119 Type 2 diabetes mellitus without complications: Secondary | ICD-10-CM | POA: Diagnosis not present

## 2020-02-19 NOTE — Progress Notes (Signed)
°  Echocardiogram 2D Echocardiogram has been performed.  Dennis [name] 02/19/2020, 1:35 PM

## 2020-02-25 ENCOUNTER — Encounter (HOSPITAL_COMMUNITY): Payer: Self-pay

## 2020-03-01 ENCOUNTER — Telehealth (HOSPITAL_COMMUNITY): Payer: Self-pay | Admitting: Vascular Surgery

## 2020-03-01 ENCOUNTER — Encounter (HOSPITAL_COMMUNITY): Payer: Self-pay

## 2020-03-02 MED ORDER — FUROSEMIDE 80 MG PO TABS: 80.0000 mg | ORAL_TABLET | Freq: Every day | ORAL | Status: DC

## 2020-03-02 MED ORDER — METOLAZONE 5 MG PO TABS: 5.0000 mg | ORAL_TABLET | ORAL | 0 refills | Status: DC

## 2020-03-02 NOTE — Telephone Encounter (Signed)
Pt called to ask about his echo results and to discuss his increased sob. Reviewed echo results w/pt. He states his wt is up and down but he feels more SOB especially with exertion. He states he does have LE edema as well. He states he is taking Lasix 160 mg every AM, we ordered him 80 mg daily, and ok to take an extra 80 mg as needed. He states he has been taking the 160 mg for about a week and a half and it has not helped any.  Discussed w/Dr Bensimhon, he would like pt to change lasix to 80 mg BID and take Metolazone 5 mg for 2 days.  Pt is aware, agreeable, and verbalizes understanding, he is going to call us on Monday and let us know if it is helping

## 2020-03-04 ENCOUNTER — Other Ambulatory Visit (HOSPITAL_COMMUNITY): Payer: Self-pay | Admitting: *Deleted

## 2020-03-04 MED ORDER — FUROSEMIDE 80 MG PO TABS: 80.0000 mg | ORAL_TABLET | Freq: Every day | ORAL | 3 refills | Status: AC

## 2020-03-04 MED ORDER — METOLAZONE 5 MG PO TABS: 5.0000 mg | ORAL_TABLET | ORAL | 0 refills | Status: AC

## 2020-03-07 NOTE — Telephone Encounter (Signed)
Pt called back stating that medication changes did not help. Called pt to get more information no answer. Will try patient again later.

## 2020-05-09 ENCOUNTER — Encounter (HOSPITAL_COMMUNITY): Payer: Medicare HMO | Admitting: Internal Medicine

## 2022-04-11 ENCOUNTER — Emergency Department (HOSPITAL_COMMUNITY): Payer: Medicare HMO

## 2022-04-11 ENCOUNTER — Emergency Department (HOSPITAL_COMMUNITY)
Admission: EM | Admit: 2022-04-11 | Discharge: 2022-04-17 | Disposition: E | Payer: Medicare HMO | Attending: Emergency Medicine | Admitting: Emergency Medicine

## 2022-04-11 DIAGNOSIS — E119 Type 2 diabetes mellitus without complications: Secondary | ICD-10-CM | POA: Diagnosis not present

## 2022-04-11 DIAGNOSIS — Z794 Long term (current) use of insulin: Secondary | ICD-10-CM | POA: Diagnosis not present

## 2022-04-11 DIAGNOSIS — R570 Cardiogenic shock: Secondary | ICD-10-CM

## 2022-04-11 DIAGNOSIS — I2510 Atherosclerotic heart disease of native coronary artery without angina pectoris: Secondary | ICD-10-CM | POA: Diagnosis not present

## 2022-04-11 DIAGNOSIS — I5022 Chronic systolic (congestive) heart failure: Secondary | ICD-10-CM | POA: Insufficient documentation

## 2022-04-11 DIAGNOSIS — I25709 Atherosclerosis of coronary artery bypass graft(s), unspecified, with unspecified angina pectoris: Secondary | ICD-10-CM

## 2022-04-11 DIAGNOSIS — R600 Localized edema: Secondary | ICD-10-CM | POA: Diagnosis not present

## 2022-04-11 DIAGNOSIS — Z79899 Other long term (current) drug therapy: Secondary | ICD-10-CM | POA: Diagnosis not present

## 2022-04-11 DIAGNOSIS — Z951 Presence of aortocoronary bypass graft: Secondary | ICD-10-CM | POA: Diagnosis not present

## 2022-04-11 DIAGNOSIS — I110 Hypertensive heart disease with heart failure: Secondary | ICD-10-CM | POA: Insufficient documentation

## 2022-04-11 DIAGNOSIS — Z7982 Long term (current) use of aspirin: Secondary | ICD-10-CM | POA: Diagnosis not present

## 2022-04-11 DIAGNOSIS — I469 Cardiac arrest, cause unspecified: Secondary | ICD-10-CM | POA: Diagnosis present

## 2022-04-11 LAB — I-STAT VENOUS BLOOD GAS, ED
Acid-base deficit: 17 mmol/L — ABNORMAL HIGH (ref 0.0–2.0)
Bicarbonate: 18.4 mmol/L — ABNORMAL LOW (ref 20.0–28.0)
Calcium, Ion: 1.42 mmol/L — ABNORMAL HIGH (ref 1.15–1.40)
HCT: 47 % (ref 39.0–52.0)
Hemoglobin: 16 g/dL (ref 13.0–17.0)
O2 Saturation: 40 %
Potassium: 3.8 mmol/L (ref 3.5–5.1)
Sodium: 134 mmol/L — ABNORMAL LOW (ref 135–145)
TCO2: 21 mmol/L — ABNORMAL LOW (ref 22–32)
pCO2, Ven: 92.1 mmHg (ref 44–60)
pH, Ven: 6.908 — CL (ref 7.25–7.43)
pO2, Ven: 39 mmHg (ref 32–45)

## 2022-04-11 LAB — I-STAT CHEM 8, ED
BUN: 22 mg/dL (ref 8–23)
Calcium, Ion: 1.42 mmol/L — ABNORMAL HIGH (ref 1.15–1.40)
Chloride: 101 mmol/L (ref 98–111)
Creatinine, Ser: 2.9 mg/dL — ABNORMAL HIGH (ref 0.61–1.24)
Glucose, Bld: 463 mg/dL — ABNORMAL HIGH (ref 70–99)
HCT: 47 % (ref 39.0–52.0)
Hemoglobin: 16 g/dL (ref 13.0–17.0)
Potassium: 3.8 mmol/L (ref 3.5–5.1)
Sodium: 134 mmol/L — ABNORMAL LOW (ref 135–145)
TCO2: 22 mmol/L (ref 22–32)

## 2022-04-11 MED ORDER — EPINEPHRINE 1 MG/10ML IJ SOSY
PREFILLED_SYRINGE | INTRAMUSCULAR | Status: AC
  Filled 2022-04-11: qty 30

## 2022-04-11 MED ORDER — CALCIUM CHLORIDE 10 % IV SOLN
INTRAVENOUS | Status: AC | PRN
  Administered 2022-04-11: 1 g via INTRAVENOUS

## 2022-04-11 MED ORDER — AMIODARONE HCL IN DEXTROSE 360-4.14 MG/200ML-% IV SOLN
INTRAVENOUS | Status: AC
  Administered 2022-04-11: 60 mg/h
  Filled 2022-04-11: qty 200

## 2022-04-11 MED ORDER — AMIODARONE HCL 150 MG/3ML IV SOLN
INTRAVENOUS | Status: AC | PRN
  Administered 2022-04-11: 150 mg via INTRAVENOUS
  Administered 2022-04-11: 300 mg via INTRAVENOUS

## 2022-04-11 MED ORDER — EPINEPHRINE 1 MG/10ML IJ SOSY
PREFILLED_SYRINGE | INTRAMUSCULAR | Status: AC
  Filled 2022-04-11: qty 20

## 2022-04-11 MED ORDER — VASOPRESSIN 20 UNITS/100 ML INFUSION FOR SHOCK
0.0000 [IU]/min | INTRAVENOUS | Status: DC
  Filled 2022-04-11: qty 100

## 2022-04-11 MED ORDER — STERILE WATER FOR INJECTION IV SOLN: INTRAVENOUS | Status: DC

## 2022-04-11 MED ORDER — EPINEPHRINE 1 MG/10ML IJ SOSY
PREFILLED_SYRINGE | INTRAMUSCULAR | Status: AC | PRN
  Administered 2022-04-11 (×10): 1 mg via INTRAVENOUS

## 2022-04-11 MED ORDER — MAGNESIUM SULFATE 50 % IJ SOLN
INTRAMUSCULAR | Status: AC | PRN
  Administered 2022-04-11: 2 g via INTRAVENOUS

## 2022-04-11 MED ORDER — NOREPINEPHRINE 4 MG/250ML-% IV SOLN
0.0000 ug/min | INTRAVENOUS | Status: DC
  Filled 2022-04-11: qty 250

## 2022-04-17 NOTE — Consult Note (Signed)
PCCM called to bedside. Patient had been receiving on/off for the past 1/5 hours. ROSC was intermittently obtained. Patient had received multiple doses of epi, bicarb, calcium, magnesium. A few shocks were delivered as well. Patient was on 50 of epi drip. Central line placed by ED Physician. Dr. Isaiah Serge spoke with spouse Lupita Leash regarding poor prognosis and she said she was on her way, currently in [place]. Shortly after speaking with her patient lost pulses again. CPR started. ED Physician, Dr. Durwin Nora called Lupita Leash. Dr. Durwin Nora and I were present for code. 3 rounds of CPR given. 1 shock given for possible vfib. No pulses obtained. TOD 1226. Spoke with ED physician and he will call family and update.  JD Anselm [facility] Apr 30, 2022, 12:42 PM  Please see [email or web address] for pager details.  From 7A-7P if no response, please call [phone]. After hours, please call ELink [phone].

## 2022-04-17 NOTE — Code Documentation (Signed)
Epi drip increased to .

## 2022-04-17 NOTE — Progress Notes (Signed)
Responded to ED page to support  Wife and son at bedside. Pt passed. Provided emotional and grief support.  Venida Jarvis, [place], [facility], Pager [phone]
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
## 2022-04-17 NOTE — Code Documentation (Signed)
Epi drip increased to 10mcg 

## 2022-04-17 NOTE — Code Documentation (Signed)
Patient time of death occurred at 1226

## 2022-04-17 NOTE — Code Documentation (Signed)
Epi Drip started at 

## 2022-04-17 NOTE — ED Provider Notes (Signed)
[facility] EMERGENCY DEPARTMENT Provider Note   CSN: [id number] Arrival date & time: 05-10-2022  1108     History  Chief Complaint  Patient presents with   CPR    Dennis [name] is a 76 y.o. male.  HPI Patient arrives after cardiac arrest.  His medical history is notable for HTN, CAD, PAD, cardiomyopathy, CHF, emphysema, GERD, T2DM, HLD, neuropathy, spinal stenosis.  Patient's wife and son report that he has had ongoing decline in physical function and worsening fatigue.  He experiences shortness of breath with exertion.  Earlier today, patient travel to [place] to go shopping.  While in a store, he had a witnessed cardiac arrest.  He received bystander CPR.  EMS was called to the scene and took over CPR.  During transit to the hospital, patient had multiple episodes of ROSC.  He would subsequently lose pulses and CPR would resume.  In total, he received 6 mg of epinephrine, 1 g of calcium, and 1 amp of bicarb.  King airway was placed and BVM respirations were provided.  Blood sugar was checked prior to arrival and it was in the 300s. CPR was ongoing at time of arrival in the ED.  Patient's wife reports that he has been DNR during previous hospitalizations.  He was started on dialysis several weeks ago.  Since he started dialysis, he stated that he would not want to be DNR.    Home Medications Prior to Admission medications   Medication Sig Start Date End Date Taking? Authorizing Provider  acetaminophen (TYLENOL) 500 MG tablet Take 1,000 mg by mouth 2 (two) times daily as needed (pain).    [provider]  aspirin EC 81 MG tablet Take 81 mg by mouth every evening.    [provider]  carvedilol (COREG) 6.25 MG tablet Take 1 tablet by mouth twice daily 04/10/19   Bensimhon, Bevelyn Buckles, MD  Cholecalciferol 50 MCG (2000 UT) CAPS Take by mouth.    [provider]  ferrous gluconate (FERGON) 324 MG tablet Take by mouth.    [provider]   furosemide (LASIX) 80 MG tablet Take 1 tablet (80 mg total) by mouth daily. Make take an additional tablet if needed 03/04/20   Bensimhon, Bevelyn Buckles, MD  insulin aspart (NOVOLOG) 100 UNIT/ML injection Inject into the skin. Sliding scale 12/01/19   [provider]  Insulin Glargine (BASAGLAR KWIKPEN) 100 UNIT/ML Inject 40 Units into the skin daily.  08/25/19   [provider]  levothyroxine (SYNTHROID, LEVOTHROID) 25 MCG tablet Take 25 mcg by mouth every evening.    [provider]  magnesium oxide (MAG-OX) 400 MG tablet Take 400 mg by mouth 2 (two) times daily.    [provider]  metolazone (ZAROXOLYN) 5 MG tablet Take 1 tablet (5 mg total) by mouth as directed. 03/04/20   Bensimhon, Bevelyn Buckles, MD  metoprolol succinate (TOPROL-XL) 25 MG 24 hr tablet Take by mouth. 12/18/19   [provider]  niacin (NIASPAN) 500 MG CR tablet Take 500 mg by mouth daily. 09/27/18   [provider]  nitroGLYCERIN (NITROSTAT) 0.4 MG SL tablet Place 0.4 mg under the tongue every 5 (five) minutes x 3 doses as needed for chest pain.  02/12/18   [provider]  pantoprazole (PROTONIX) 40 MG tablet Take 40 mg by mouth 2 (two) times daily.     [provider]  tamsulosin (FLOMAX) 0.4 MG CAPS capsule TAKE 1 CAPSULE BY MOUTH ONCE DAILY AFTER  SUPPER 09/28/19   Bensimhon, Bevelyn Buckles, MD      Allergies    Baclofen, Gabapentin, Iron, Chlorhexidine, and Other    Review of Systems   Review of Systems  Unable to perform ROS: Patient unresponsive    Physical Exam Updated Vital Signs BP (!) 72/49   Pulse (!) 117   Resp 14   Ht 5\' 9"  (1.753 m)   Wt 90 kg   SpO2 93%   BMI 29.30 kg/m  Physical Exam Constitutional:      Appearance: He is ill-appearing.  HENT:     Head: Normocephalic and atraumatic.     Right Ear: External ear normal.     Left Ear: External ear normal.     Nose: Nose normal.  Eyes:     Comments: Pupils 3 mm bilaterally and minimally fixed   Cardiovascular:     Comments: PEA Pulmonary:     Comments: King airway in place. Abdominal:     General: There is no distension.     Palpations: Abdomen is soft.  Musculoskeletal:     Right lower leg: Edema present.     Left lower leg: Edema present.  Skin:    General: Skin is cool.     Coloration: Skin is pale.  Neurological:     GCS: GCS eye subscore is 1. GCS verbal subscore is 1. GCS motor subscore is 1.     ED Results / Procedures / Treatments   Labs (all labs ordered are listed, but only abnormal results are displayed) Labs Reviewed  I-STAT VENOUS BLOOD GAS, ED - Abnormal; Notable for the following components:      Result Value   pH, Ven 6.908 (*)    pCO2, Ven 92.1 (*)    Bicarbonate 18.4 (*)    TCO2 21 (*)    Acid-base deficit 17.0 (*)    Sodium 134 (*)    Calcium, Ion 1.42 (*)    All other components within normal limits  I-STAT CHEM 8, ED - Abnormal; Notable for the following components:   Sodium 134 (*)    Creatinine, Ser 2.90 (*)    Glucose, Bld 463 (*)    Calcium, Ion 1.42 (*)    All other components within normal limits  CULTURE, BLOOD (ROUTINE X 2)  CULTURE, BLOOD (ROUTINE X 2)  URINE CULTURE  CULTURE, RESPIRATORY W GRAM STAIN  COMPREHENSIVE METABOLIC PANEL  LACTIC ACID, PLASMA  LACTIC ACID, PLASMA  LACTIC ACID, PLASMA  LACTIC ACID, PLASMA  CBC  APTT  PROTIME-INR  MAGNESIUM  PHOSPHORUS  RAPID URINE DRUG SCREEN, HOSP PERFORMED  PROCALCITONIN  URINALYSIS, ROUTINE W REFLEX MICROSCOPIC  CBG MONITORING, ED  TROPONIN I (HIGH SENSITIVITY)  TROPONIN I (HIGH SENSITIVITY)    EKG None  Radiology No results found.  Procedures Procedure Name: Intubation Date/Time: 02-May-2022 4:53 PM  Performed by: 04/13/2022, MDPre-anesthesia Checklist: Emergency Drugs available, Suction available and Patient being monitored Oxygen Delivery Method: Ambu bag Preoxygenation: Pre-oxygenation with 100% oxygen Laryngoscope Size: Glidescope and 3 Grade View:  Grade I Tube size: 7.5 mm Number of attempts: 1 Airway Equipment and Method: Rigid stylet and Video-laryngoscopy Placement Confirmation: ETT inserted through vocal cords under direct vision, CO2 detector and Breath sounds checked- equal and bilateral Secured at: 23 cm Tube secured with: ETT holder Dental Injury: Teeth and Oropharynx as per pre-operative assessment     .Central Line  Date/Time: May 02, 2022 4:54 PM  Performed by: 04/13/2022, MD Authorized by: Gloris Manchester, MD   Consent:  Consent obtained:  Emergent situation Pre-procedure details:    Indication(s): central venous access and insufficient peripheral access     Skin preparation:  Chlorhexidine   Skin preparation agent: Skin preparation agent completely dried prior to procedure   Procedure details:    Location:  L femoral   Patient position:  Supine   Procedural supplies:  Triple lumen   Catheter size:  7 Fr   Landmarks identified: yes     Ultrasound guidance: yes     Ultrasound guidance timing: real time     Sterile ultrasound techniques: Sterile gel and sterile probe covers were used     Number of attempts:  3   Successful placement: yes   Post-procedure details:    Post-procedure:  Dressing applied   Assessment:  Blood return through all ports and free fluid flow   Procedure completion:  Tolerated well, no immediate complications CPR  Date/Time: 2022-04-17 4:55 PM  Performed by: Godfrey Pick, MD Authorized by: Godfrey Pick, MD  CPR Procedure Details:      Amount of time prior to administration of ACLS/BLS (minutes):  5   ACLS/BLS initiated by EMS: Yes     CPR/ACLS performed in the ED: Yes     Duration of CPR (minutes):  120   Outcome: Pt declared dead    CPR performed via ACLS guidelines under my direct supervision.  See RN documentation for details including defibrillator use, medications, doses and timing.     Medications Ordered in ED Medications  EPINEPHrine (ADRENALIN) 1 MG/10ML injection (has no  administration in time range)  norepinephrine (LEVOPHED) 4mg  in 232mL (0.016 mg/mL) premix infusion (has no administration in time range)  vasopressin (PITRESSIN) 20 Units in sodium chloride 0.9 % 100 mL infusion-*FOR SHOCK* (has no administration in time range)  EPINEPHrine (ADRENALIN) 1 MG/10ML injection (has no administration in time range)  amiodarone (NEXTERONE PREMIX) 360-4.14 MG/200ML-% (1.8 mg/mL) IV infusion (0 mg/hr  Stopped 04/17/22 1130)  EPINEPHrine (ADRENALIN) 1 MG/10ML injection (1 mg Intravenous Given 04/17/22 1225)  calcium chloride injection (1 g Intravenous Given 04-17-22 1112)  magnesium sulfate (IV Push/IM) injection (2 g Intravenous Given April 17, 2022 1116)  amiodarone (CORDARONE) injection (150 mg Intravenous Given 2022/04/17 1122)    ED Course/ Medical Decision Making/ A&P                           Medical Decision Making Amount and/or Complexity of Data Reviewed Labs: ordered.  Risk Prescription drug management.   This patient presents to the ED for concern of cardiac arrest, this involves an extensive number of treatment options, and is a complaint that carries with it a high risk of complications and morbidity.  The differential diagnosis includes MI, respiratory arrest, PE   Co morbidities that complicate the patient evaluation  HTN, CAD, PAD, cardiomyopathy, CHF, emphysema, GERD, T2DM, HLD, neuropathy, spinal stenosis   Additional history obtained:  Additional history obtained from EMS, patient's wife and son External records from outside source obtained and reviewed including EMR   Lab Tests:  I Ordered, and personally interpreted labs.  The pertinent results include: N/A   Cardiac Monitoring: / EKG:  The patient was maintained on a cardiac monitor.  I personally viewed and interpreted the cardiac monitored which showed an underlying rhythm of: PEA on arrival, subsequently episodes of ROSC, ventricular tachycardia, PEA   Consultations Obtained:  I  requested consultation with the cardiologist, intensivist,  and discussed lab and imaging findings as  well as pertinent plan - they recommend: Stabilization in the ED if possible.   Problem List / ED Course / Critical interventions / Medication management  Patient is a 76 year old male who presents after a cardiac arrest.  This was witnessed in the store and bystander CPR was initiated.  EMS took over CPR on scene.  They continued CPR during transit.  Prior to arrival, patient had 4 episodes of ROSC followed by subsequent loss of pulses.  He received a total of 6 mg of epinephrine, 1 g of calcium, and 1 amp of bicarb prior to arrival.  Blood glucose was checked and found to be in the 300s.  A King airway was placed.  Breathing was assisted with Ambu bag.  Patient arrives in the ED with ongoing CPR.  Initial rhythm shows PEA.  CPR was continued.  Epi was given.  Patient was intubated.  Shortly thereafter, ROSC was obtained.  Epi gtt. was initiated.  Patient subsequently would have loss of pulses, changes in rhythm, episodes of ROSC.  In total, he received approximately 1 hour of CPR while in the ED.  This is on top of the hour he received prior to arrival.  Rhythms that were identified included bradycardia, ventricular tachycardia.  Amiodarone was given.  At one point, did appear that he had a polymorphic V. tach and magnesium was given.  He received additional calcium and IV fluids.  Patient received approximately 8 additional doses of epinephrine in addition to the 60 received prior to arrival.  Both critical care and cardiology came to bedside.  They did not have any further recommendations at this time.  After 1 hour of resuscitation, I did speak with his wife over the telephone.  I informed her of the dire situation.  At this point, she was on her way to the ED from [facility].  Patient's wife stated that he has been DNR in the past but recently he has stated that he would want full resuscitative  measures.  Given that he has received approximately 2 hours of CPR, any chance that a good neurologic outcome is minimal.  Patient ultimately lost pulses and ROSC was not able to be obtained.  At this point, he was on a 50 mcg/min dose of epinephrine and receiving additional doses of epinephrine.  He has a history of CHF and his wife reports that he has a EF of 20% at baseline.  I feel that any further efforts are futile.  Patient's wife is understanding of this.  Patient was declared deceased at 42.  I spoke with his wife and son after they arrived.  They were updated on the course of events.  Patient's cause of death appears to be natural.  I do not feel that medical examiner consultation is indicated.  Patient and son were allowed to be at bedside.  Chaplain services were provided to assist with after death coordinations.  I attempted to contact his primary care doctor by telephone but was unable to. I ordered medication including epinephrine, calcium gluconate, amiodarone, IV fluids, magnesium sulfate for ACLS Reevaluation of the patient after these medicines showed that the patient stayed the same I have reviewed the patients home medicines and have made adjustments as needed   Social Determinants of Health:  Deceased  CRITICAL CARE Performed by: Godfrey Pick   Total critical care time: 35 minutes  Critical care time was exclusive of separately billable procedures and treating other patients.  Critical care was necessary to treat or prevent  imminent or life-threatening deterioration.  Critical care was time spent personally by me on the following activities: development of treatment plan with patient and/or surrogate as well as nursing, discussions with consultants, evaluation of patient's response to treatment, examination of patient, obtaining history from patient or surrogate, ordering and performing treatments and interventions, ordering and review of laboratory studies, ordering and  review of radiographic studies, pulse oximetry and re-evaluation of patient's condition.         Final Clinical Impression(s) / ED Diagnoses Final diagnoses:  Cardiac arrest [facility])    Rx / Hugo Orders ED Discharge Orders     None         Godfrey Pick, MD 2022-05-07 1712

## 2022-04-17 NOTE — Code Documentation (Signed)
Stopped pacing the patient at this time.
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
## 2022-04-17 DEATH — deceased
